# Patient Record
Sex: Female | Born: 1985 | Race: White | Hispanic: No | Marital: Married | Smoking: Former smoker
Health system: Southern US, Community
[De-identification: ages and names within clinical notes are randomized; demographics above are authoritative.]

## PROBLEM LIST (undated history)

## (undated) DIAGNOSIS — H01009 Unspecified blepharitis unspecified eye, unspecified eyelid: Secondary | ICD-10-CM

## (undated) DIAGNOSIS — J45901 Unspecified asthma with (acute) exacerbation: Secondary | ICD-10-CM

## (undated) DIAGNOSIS — H6690 Otitis media, unspecified, unspecified ear: Secondary | ICD-10-CM

## (undated) DIAGNOSIS — J309 Allergic rhinitis, unspecified: Secondary | ICD-10-CM

## (undated) DIAGNOSIS — G43009 Migraine without aura, not intractable, without status migrainosus: Secondary | ICD-10-CM

## (undated) DIAGNOSIS — M5090 Cervical disc disorder, unspecified, unspecified cervical region: Secondary | ICD-10-CM

## (undated) DIAGNOSIS — K219 Gastro-esophageal reflux disease without esophagitis: Secondary | ICD-10-CM

## (undated) DIAGNOSIS — M65849 Other synovitis and tenosynovitis, unspecified hand: Secondary | ICD-10-CM

## (undated) DIAGNOSIS — E785 Hyperlipidemia, unspecified: Secondary | ICD-10-CM

## (undated) DIAGNOSIS — F411 Generalized anxiety disorder: Secondary | ICD-10-CM

## (undated) DIAGNOSIS — M545 Low back pain: Secondary | ICD-10-CM

## (undated) DIAGNOSIS — J45909 Unspecified asthma, uncomplicated: Secondary | ICD-10-CM

## (undated) DIAGNOSIS — L509 Urticaria, unspecified: Secondary | ICD-10-CM

## (undated) DIAGNOSIS — J019 Acute sinusitis, unspecified: Secondary | ICD-10-CM

## (undated) DIAGNOSIS — M6740 Ganglion, unspecified site: Secondary | ICD-10-CM

## (undated) DIAGNOSIS — J069 Acute upper respiratory infection, unspecified: Secondary | ICD-10-CM

## (undated) DIAGNOSIS — M65839 Other synovitis and tenosynovitis, unspecified forearm: Secondary | ICD-10-CM

## (undated) DIAGNOSIS — R51 Headache: Secondary | ICD-10-CM

## (undated) HISTORY — DX: Generalized anxiety disorder: F41.1

## (undated) HISTORY — DX: Allergic rhinitis, unspecified: J30.9

## (undated) HISTORY — DX: Migraine without aura, not intractable, without status migrainosus: G43.009

## (undated) HISTORY — DX: Hyperlipidemia, unspecified: E78.5

## (undated) HISTORY — DX: Morbid (severe) obesity due to excess calories: E66.01

## (undated) HISTORY — DX: Unspecified blepharitis unspecified eye, unspecified eyelid: H01.009

## (undated) HISTORY — DX: Unspecified asthma with (acute) exacerbation: J45.901

## (undated) HISTORY — DX: Headache: R51

## (undated) HISTORY — DX: Gastro-esophageal reflux disease without esophagitis: K21.9

## (undated) HISTORY — DX: Ganglion, unspecified site: M67.40

## (undated) HISTORY — DX: Urticaria, unspecified: L50.9

## (undated) HISTORY — DX: Otitis media, unspecified, unspecified ear: H66.90

## (undated) HISTORY — DX: Cervical disc disorder, unspecified, unspecified cervical region: M50.90

## (undated) HISTORY — DX: Unspecified asthma, uncomplicated: J45.909

## (undated) HISTORY — DX: Other synovitis and tenosynovitis, unspecified hand: M65.849

## (undated) HISTORY — DX: Acute upper respiratory infection, unspecified: J06.9

## (undated) HISTORY — DX: Other synovitis and tenosynovitis, unspecified forearm: M65.839

## (undated) HISTORY — DX: Low back pain: M54.5

## (undated) HISTORY — DX: Acute sinusitis, unspecified: J01.90

---
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
## 2006-04-06 ENCOUNTER — Ambulatory Visit: Payer: Self-pay | Admitting: Internal Medicine

## 2006-09-04 ENCOUNTER — Encounter: Payer: Self-pay | Admitting: Internal Medicine

## 2006-09-04 DIAGNOSIS — F418 Other specified anxiety disorders: Secondary | ICD-10-CM

## 2006-09-04 DIAGNOSIS — M545 Low back pain, unspecified: Secondary | ICD-10-CM

## 2006-09-04 DIAGNOSIS — F411 Generalized anxiety disorder: Secondary | ICD-10-CM

## 2006-09-04 HISTORY — DX: Generalized anxiety disorder: F41.1

## 2006-09-04 HISTORY — DX: Low back pain, unspecified: M54.50

## 2006-09-04 HISTORY — DX: Morbid (severe) obesity due to excess calories: E66.01

## 2006-10-05 ENCOUNTER — Emergency Department: Payer: Self-pay | Admitting: Internal Medicine

## 2006-10-05 ENCOUNTER — Other Ambulatory Visit: Payer: Self-pay

## 2006-11-26 ENCOUNTER — Telehealth (INDEPENDENT_AMBULATORY_CARE_PROVIDER_SITE_OTHER): Payer: Self-pay | Admitting: *Deleted

## 2006-12-24 ENCOUNTER — Ambulatory Visit: Payer: Self-pay | Admitting: Internal Medicine

## 2006-12-25 DIAGNOSIS — J45909 Unspecified asthma, uncomplicated: Secondary | ICD-10-CM

## 2006-12-25 HISTORY — DX: Unspecified asthma, uncomplicated: J45.909

## 2007-06-10 ENCOUNTER — Telehealth (INDEPENDENT_AMBULATORY_CARE_PROVIDER_SITE_OTHER): Payer: Self-pay | Admitting: *Deleted

## 2007-06-14 ENCOUNTER — Ambulatory Visit: Payer: Self-pay | Admitting: Internal Medicine

## 2007-06-14 DIAGNOSIS — E785 Hyperlipidemia, unspecified: Secondary | ICD-10-CM | POA: Insufficient documentation

## 2007-06-14 DIAGNOSIS — G43009 Migraine without aura, not intractable, without status migrainosus: Secondary | ICD-10-CM

## 2007-06-14 HISTORY — DX: Migraine without aura, not intractable, without status migrainosus: G43.009

## 2007-06-14 HISTORY — DX: Hyperlipidemia, unspecified: E78.5

## 2007-08-16 ENCOUNTER — Ambulatory Visit: Payer: Self-pay | Admitting: Internal Medicine

## 2007-08-16 LAB — CONVERTED CEMR LAB
ALT: 19 units/L (ref 0–35)
AST: 19 units/L (ref 0–37)
BUN: 12 mg/dL (ref 6–23)
Basophils Absolute: 0 10*3/uL (ref 0.0–0.1)
Basophils Relative: 0.5 % (ref 0.0–3.0)
CO2: 23 meq/L (ref 19–32)
Chloride: 110 meq/L (ref 96–112)
Creatinine, Ser: 0.7 mg/dL (ref 0.4–1.2)
Eosinophils Relative: 2.6 % (ref 0.0–5.0)
LDL Cholesterol: 84 mg/dL (ref 0–99)
Leukocytes, UA: NEGATIVE
Lymphocytes Relative: 35.9 % (ref 12.0–46.0)
Monocytes Relative: 7.4 % (ref 3.0–12.0)
Neutrophils Relative %: 53.6 % (ref 43.0–77.0)
Nitrite: NEGATIVE
RBC: 4.53 M/uL (ref 3.87–5.11)
Specific Gravity, Urine: 1.015 (ref 1.000–1.03)
TSH: 1.63 microintl units/mL (ref 0.35–5.50)
Total Bilirubin: 1.1 mg/dL (ref 0.3–1.2)
Total CHOL/HDL Ratio: 4.2
Urobilinogen, UA: 0.2 (ref 0.0–1.0)
WBC: 8.5 10*3/uL (ref 4.5–10.5)

## 2007-10-11 ENCOUNTER — Ambulatory Visit: Payer: Self-pay | Admitting: Internal Medicine

## 2007-10-11 DIAGNOSIS — I10 Essential (primary) hypertension: Secondary | ICD-10-CM

## 2007-10-11 DIAGNOSIS — M65849 Other synovitis and tenosynovitis, unspecified hand: Secondary | ICD-10-CM

## 2007-10-11 DIAGNOSIS — M65839 Other synovitis and tenosynovitis, unspecified forearm: Secondary | ICD-10-CM

## 2007-10-11 DIAGNOSIS — M6740 Ganglion, unspecified site: Secondary | ICD-10-CM

## 2007-10-11 DIAGNOSIS — J019 Acute sinusitis, unspecified: Secondary | ICD-10-CM

## 2007-10-11 HISTORY — DX: Acute sinusitis, unspecified: J01.90

## 2007-10-11 HISTORY — DX: Other synovitis and tenosynovitis, unspecified forearm: M65.839

## 2007-10-11 HISTORY — DX: Ganglion, unspecified site: M67.40

## 2007-10-31 ENCOUNTER — Encounter: Payer: Self-pay | Admitting: Internal Medicine

## 2007-12-02 ENCOUNTER — Telehealth (INDEPENDENT_AMBULATORY_CARE_PROVIDER_SITE_OTHER): Payer: Self-pay | Admitting: *Deleted

## 2008-02-17 ENCOUNTER — Telehealth: Payer: Self-pay | Admitting: Internal Medicine

## 2008-02-19 ENCOUNTER — Emergency Department: Payer: Self-pay | Admitting: Internal Medicine

## 2008-03-27 ENCOUNTER — Ambulatory Visit: Payer: Self-pay | Admitting: Internal Medicine

## 2008-03-27 DIAGNOSIS — H6690 Otitis media, unspecified, unspecified ear: Secondary | ICD-10-CM

## 2008-03-27 HISTORY — DX: Otitis media, unspecified, unspecified ear: H66.90

## 2008-03-31 ENCOUNTER — Telehealth (INDEPENDENT_AMBULATORY_CARE_PROVIDER_SITE_OTHER): Payer: Self-pay | Admitting: *Deleted

## 2008-05-21 ENCOUNTER — Ambulatory Visit: Payer: Self-pay | Admitting: Internal Medicine

## 2008-05-21 DIAGNOSIS — H01009 Unspecified blepharitis unspecified eye, unspecified eyelid: Secondary | ICD-10-CM | POA: Insufficient documentation

## 2008-05-21 HISTORY — DX: Unspecified blepharitis unspecified eye, unspecified eyelid: H01.009

## 2008-06-25 ENCOUNTER — Encounter: Admission: RE | Admit: 2008-06-25 | Discharge: 2008-06-25 | Payer: Self-pay | Admitting: Obstetrics & Gynecology

## 2008-10-23 ENCOUNTER — Ambulatory Visit: Payer: Self-pay | Admitting: Internal Medicine

## 2008-10-23 DIAGNOSIS — R51 Headache: Secondary | ICD-10-CM

## 2008-10-23 DIAGNOSIS — R519 Headache, unspecified: Secondary | ICD-10-CM | POA: Insufficient documentation

## 2008-10-23 HISTORY — DX: Headache: R51

## 2008-10-23 LAB — CONVERTED CEMR LAB
Alkaline Phosphatase: 36 units/L — ABNORMAL LOW (ref 39–117)
Basophils Absolute: 0.1 10*3/uL (ref 0.0–0.1)
Bilirubin Urine: NEGATIVE
Bilirubin, Direct: 0.1 mg/dL (ref 0.0–0.3)
CO2: 26 meq/L (ref 19–32)
Calcium: 9.2 mg/dL (ref 8.4–10.5)
Creatinine, Ser: 0.8 mg/dL (ref 0.4–1.2)
Eosinophils Absolute: 0.3 10*3/uL (ref 0.0–0.7)
HDL: 34.5 mg/dL — ABNORMAL LOW (ref >39.00)
Ketones, ur: NEGATIVE mg/dL
Leukocytes, UA: NEGATIVE
Lymphocytes Relative: 27.3 % (ref 12.0–46.0)
MCHC: 35 g/dL (ref 30.0–36.0)
Neutrophils Relative %: 61.1 % (ref 43.0–77.0)
RBC: 4.58 M/uL (ref 3.87–5.11)
RDW: 11.4 % — ABNORMAL LOW (ref 11.5–14.6)
TSH: 1.04 microintl units/mL (ref 0.35–5.50)
Total Bilirubin: 1 mg/dL (ref 0.3–1.2)
Total CHOL/HDL Ratio: 5
Urobilinogen, UA: 0.2 (ref 0.0–1.0)
VLDL: 19.8 mg/dL (ref 0.0–40.0)

## 2009-05-03 ENCOUNTER — Ambulatory Visit: Payer: Self-pay | Admitting: Internal Medicine

## 2009-05-03 DIAGNOSIS — J45901 Unspecified asthma with (acute) exacerbation: Secondary | ICD-10-CM | POA: Insufficient documentation

## 2009-05-03 DIAGNOSIS — L509 Urticaria, unspecified: Secondary | ICD-10-CM

## 2009-05-03 HISTORY — DX: Unspecified asthma with (acute) exacerbation: J45.901

## 2009-05-03 HISTORY — DX: Urticaria, unspecified: L50.9

## 2009-05-10 ENCOUNTER — Telehealth: Payer: Self-pay | Admitting: Internal Medicine

## 2009-10-29 ENCOUNTER — Ambulatory Visit: Payer: Self-pay | Admitting: Internal Medicine

## 2009-10-29 DIAGNOSIS — M5030 Other cervical disc degeneration, unspecified cervical region: Secondary | ICD-10-CM | POA: Insufficient documentation

## 2009-10-29 DIAGNOSIS — J069 Acute upper respiratory infection, unspecified: Secondary | ICD-10-CM

## 2009-10-29 HISTORY — DX: Acute upper respiratory infection, unspecified: J06.9

## 2009-10-30 DIAGNOSIS — J309 Allergic rhinitis, unspecified: Secondary | ICD-10-CM

## 2009-10-30 HISTORY — DX: Allergic rhinitis, unspecified: J30.9

## 2009-11-22 ENCOUNTER — Telehealth: Payer: Self-pay | Admitting: Internal Medicine

## 2010-02-08 NOTE — Progress Notes (Signed)
  Phone Note Refill Request Message from:  Fax from Pharmacy on November 22, 2009 10:48 AM  Refills Requested: Medication #1:  IBUPROFEN 800 MG TABS Take 1 tablet by mouth twice a day   Dosage confirmed as above?Dosage Confirmed   Last Refilled: 10/23/2008   Notes: [facility] Initial call taken by: Robin Ewing CMA (AAMA),  November 22, 2009 10:49 AM    Prescriptions: IBUPROFEN 800 MG TABS (IBUPROFEN) Take 1 tablet by mouth twice a day  #60 x 6   Entered by:   Scharlene Gloss CMA (AAMA)   Authorized by:   Corwin Levins MD   Signed by:   Scharlene Gloss CMA (AAMA) on 11/22/2009   Method used:   Faxed to ...       [facility] [phone]* (retail)       [address] [place], [place]  [address]       Ph: [phone]       Fax: [phone]   RxID:   [id number]

## 2010-02-08 NOTE — Progress Notes (Signed)
Summary: med Refills  Phone Note Refill Request  on May 10, 2009 10:11 AM  Refills Requested: Medication #1:  XANAX 1 MG  TABS 1 three times a day as needed nerves - to fill oct 14   Dosage confirmed as above?Dosage Confirmed   Last Refilled: 10/23/2008   Notes: [facility] fax#[phone]  Medication #2:  VICODIN 5-500 MG  TABS 1 by mouth two times a day as needed pain - to fill oct 15   Dosage confirmed as above?Dosage Confirmed   Last Refilled: 10/23/2008   Notes: [facility] [phone] Initial call taken by: Scharlene Gloss,  May 10, 2009 10:13 AM  Follow-up for Phone Call        both done hardcopy to [facility] side B - dahlia  Follow-up by: Corwin Levins MD,  May 10, 2009 1:17 PM  Additional Follow-up for Phone Call Additional follow up Details #1::        rx faxed to pharmacy Additional Follow-up by: Margaret Pyle, CMA,  May 10, 2009 1:20 PM    New/Updated Medications: XANAX 1 MG  TABS (ALPRAZOLAM) 1 three times a day as needed nerves - to fill May 10, 2009  -   please make return office visit for further refills VICODIN 5-500 MG  TABS (HYDROCODONE-ACETAMINOPHEN) 1 by mouth two times a day as needed pain - to fill May 10, 2009  -  please make return office visit for further refills Prescriptions: VICODIN 5-500 MG  TABS (HYDROCODONE-ACETAMINOPHEN) 1 by mouth two times a day as needed pain - to fill May 10, 2009  -  please make return office visit for further refills  #60 x 5   Entered and Authorized by:   Corwin Levins MD   Signed by:   Corwin Levins MD on 05/10/2009   Method used:   Print then Give to Patient   RxID:   [id number] XANAX 1 MG  TABS (ALPRAZOLAM) 1 three times a day as needed nerves - to fill May 10, 2009  -   please make return office visit for further refills  #90 x 5   Entered and Authorized by:   Corwin Levins MD   Signed by:   Corwin Levins MD on 05/10/2009   Method used:   Print then Give to Patient   RxID:   [id number]

## 2010-02-08 NOTE — Assessment & Plan Note (Signed)
Summary: PLACE ON BACK HURTS/NWS WANTED THIS DATE/NWS   Vital Signs:  Patient profile:   25 year old female Height:      71 inches Weight:      270.50 pounds BMI:     37.86 O2 Sat:      98 % on Room air Temp:     98.4 degrees F oral Pulse rate:   79 / minute BP sitting:   120 / 80  (left arm) Cuff size:   large  Vitals Entered By: Zella Ball Ewing CMA (AAMA) (October 29, 2009 2:21 PM)  O2 Flow:  Room air CC: followup/RE   CC:  followup/RE.  History of Present Illness: here to fu - lost approx 40 lbs  - only taking the BP med every other day as BP seems to be lower and she has some weakness and occasional dizziness;  Pt denies CP, worsening sob, doe, wheezing, orthopnea, pnd, worsening LE edema, palps, dizziness or syncope  Pt denies new neuro symptoms such as headache, facial or extremity weakness      also saw orhto this am - ? cervical disc dz - to start prednisone trral , then MRI if not better in 10 days, still with shooting pain to the left arm at this time but no weakness or numbness  also with URI symptoms for 2  days with mild low grade fever, nasal congestion wtih clearish drainage but no pain or blood. Some mild left ear popping this am , but no hearing loss or vertigo.   Also nasal allegy itch and sneeze without fever or pain for several weeks - out of allergy meds. No headache, but has midl ST yesterday now better.    Denies worsening depressive symtpoms, suicidal ideation or panic.    Preventive Screening-Counseling & Management      Drug Use:  no.    Problems Prior to Update: 1)  Urticaria  (ICD-708.9) 2)  Asthma, With Acute Exacerbation  (ICD-493.92) 3)  Headache  (ICD-784.0) 4)  Preventive Health Care  (ICD-V70.0) 5)  Blepharitis, Left  (ICD-373.00) 6)  Otitis Media, Acute, Left  (ICD-382.9) 7)  Hypertension  (ICD-401.9) 8)  Sinusitis- Acute-nos  (ICD-461.9) 9)  Tenosynovitis, Wrist  (ICD-727.05) 10)  Ganglion Cyst, Wrist, Left  (ICD-727.41) 11)  Common  Migraine  (ICD-346.10) 12)  Preventive Health Care  (ICD-V70.0) 13)  Hyperlipidemia  (ICD-272.4) 14)  Preventive Health Care  (ICD-V70.0) 15)  Family History Diabetes 1st Degree Relative  (ICD-V18.0) 16)  Asthma  (ICD-493.90) 17)  Morbid Obesity  (ICD-278.01) 18)  Low Back Pain  (ICD-724.2) 19)  Depression  (ICD-311) 20)  Anxiety  (ICD-300.00)  Medications Prior to Update: 1)  Xanax 1 Mg  Tabs (Alprazolam) .Marland Kitchen.. 1 Three Times A Day As Needed Nerves - To Fill May 10, 2009  -   Please Make Return Office Visit For Further Refills 2)  Ibuprofen 800 Mg Tabs (Ibuprofen) .... Take 1 Tablet By Mouth Twice A Day 3)  Symbicort 160-4.5 Mcg/act  Aero (Budesonide-Formoterol Fumarate) .... 2 Puffs Two Times A Day 4)  Vicodin 5-500 Mg  Tabs (Hydrocodone-Acetaminophen) .Marland Kitchen.. 1 By Mouth Two Times A Day As Needed Pain - To Fill May 10, 2009  -  Please Make Return Office Visit For Further Refills 5)  Loestrin 24 Fe 1-20 Mg-Mcg Tabs (Norethin Ace-Eth Estrad-Fe) .Marland Kitchen.. 1 By Mouth Daily 6)  Amlodipine Besylate 5 Mg Tabs (Amlodipine Besylate) .Marland Kitchen.. 1 By Mouth Once Daily 7)  Cetirizine Hcl 10 Mg Tabs (Cetirizine  Hcl) .... 1 By Mouth Once Daily As Needed 8)  Epipen 2-Pak 0.3 Mg/0.42ml Devi (Epinephrine) .... Use Asd As Needed 9)  Prednisone 10 Mg Tabs (Prednisone) .... 3po Qd For 3days, Then 2po Qd For 3days, Then 1po Qd For 3days, Then Stop 10)  Tessalon Perles 100 Mg Caps (Benzonatate) .Marland Kitchen.. 1 - 2 By Mouth Three Times A Day As Needed Cough 11)  Singulair 10 Mg Tabs (Montelukast Sodium) .Marland Kitchen.. 1po Once Daily  Current Medications (verified): 1)  Xanax 1 Mg  Tabs (Alprazolam) .Marland Kitchen.. 1 Three Times A Day As Needed Nerves - To Fill Oct 29, 2009 - 2)  Ibuprofen 800 Mg Tabs (Ibuprofen) .... Take 1 Tablet By Mouth Twice A Day 3)  Symbicort 160-4.5 Mcg/act  Aero (Budesonide-Formoterol Fumarate) .... 2 Puffs Two Times A Day 4)  Vicodin 5-500 Mg  Tabs (Hydrocodone-Acetaminophen) .Marland Kitchen.. 1 By Mouth Two Times A Day As Needed Pain - To Fill  Oct 29, 2009 5)  Amlodipine Besylate 2.5 Mg Tabs (Amlodipine Besylate) .Marland Kitchen.. 1po Once Daily 6)  Levocetirizine Dihydrochloride 5 Mg Tabs (Levocetirizine Dihydrochloride) .Marland Kitchen.. 1po Once Daily 7)  Epipen 2-Pak 0.3 Mg/0.61ml Devi (Epinephrine) .... Use Asd As Needed 8)  Bcp Per Gyn  Allergies (verified): No Known Drug Allergies  Past History:  Past Surgical History: Last updated: 12/24/2006 Back surgery - s/p lumbar disc 1/05  Social History: Last updated: 10/29/2009 Alcohol use-yes Current Smoker work - [employer] 12 hr days Drug use-no  Risk Factors: Smoking Status: current (06/14/2007)  Past Medical History: Anxiety Depression Low back pain - chronic Asthma migraine morbid obesity Hyperlipidemia Hypertension pre-cervical cancer Allergic rhinitis  Social History: Alcohol use-yes Current Smoker work - [employer] 12 hr days Drug use-no Drug Use:  no  Review of Systems       all otherwise negative per pt -    Physical Exam  General:  alert and overweight-appearing.   Head:  normocephalic and atraumatic.   Eyes:  vision grossly intact, pupils equal, and pupils round.   Ears:  R ear normal.  , left tm mild erythema, canals ok, sinus nontender Nose:  nasal dischargemucosal pallor and mucosal edema.   Mouth:  pharyngeal erythema and fair dentition.   Neck:  supple and no masses.   Lungs:  normal respiratory effort and normal breath sounds.   Heart:  normal rate and regular rhythm.   Msk:  mild lower cervical tender without cervical paravertebral tedner or swelling Extremities:  no edema, no erythema  Neurologic:  strength normal in all extremities and gait normal.     Impression & Recommendations:  Problem # 1:  HYPERTENSION (ICD-401.9)  Her updated medication list for this problem includes:    Amlodipine Besylate 2.5 Mg Tabs (Amlodipine besylate) .Marland Kitchen... 1po once daily to decrease  the amlodipine due to wt loss;  f/u BP at home adn next visit  BP today:  120/80 Prior BP: 120/80 (05/03/2009)  Labs Reviewed: K+: 3.8 (10/23/2008) Creat: : 0.8 (10/23/2008)   Chol: 177 (10/23/2008)   HDL: 34.50 (10/23/2008)   LDL: 123 (10/23/2008)   TG: 99.0 (10/23/2008)  Problem # 2:  ALLERGIC RHINITIS (ICD-477.9)  Her updated medication list for this problem includes:    Levocetirizine Dihydrochloride 5 Mg Tabs (Levocetirizine dihydrochloride) .Marland Kitchen... 1po once daily treat as above, f/u any worsening signs or symptoms   Problem # 3:  URI (ICD-465.9)  The following medications were removed from the medication list:    Tessalon Perles 100 Mg Caps (Benzonatate) .Marland Kitchen... 1 -  2 by mouth three times a day as needed cough Her updated medication list for this problem includes:    Ibuprofen 800 Mg Tabs (Ibuprofen) .Marland Kitchen... Take 1 tablet by mouth twice a day    Levocetirizine Dihydrochloride 5 Mg Tabs (Levocetirizine dihydrochloride) .Marland Kitchen... 1po once daily treat as above, f/u any worsening signs or symptoms   - c/w viral illness; for mucinex otc as needed as well   Problem # 4:  ANXIETY (ICD-300.00)  Her updated medication list for this problem includes:    Xanax 1 Mg Tabs (Alprazolam) .Marland Kitchen... 1 three times a day as needed nerves - to fill Oct 29, 2009 - stable overall by hx and exam, ok to continue meds/tx as is   Problem # 5:  DISC DISEASE, CERVICAL (ICD-722.4) ok to cont pain meds for now, prednisone to start as per ortho, for MRI soon if not improved  Complete Medication List: 1)  Xanax 1 Mg Tabs (Alprazolam) .Marland Kitchen.. 1 three times a day as needed nerves - to fill Oct 29, 2009 - 2)  Ibuprofen 800 Mg Tabs (Ibuprofen) .... Take 1 tablet by mouth twice a day 3)  Symbicort 160-4.5 Mcg/act Aero (Budesonide-formoterol fumarate) .... 2 puffs two times a day 4)  Vicodin 5-500 Mg Tabs (Hydrocodone-acetaminophen) .Marland Kitchen.. 1 by mouth two times a day as needed pain - to fill Oct 29, 2009 5)  Amlodipine Besylate 2.5 Mg Tabs (Amlodipine besylate) .Marland Kitchen.. 1po once daily 6)  Levocetirizine  Dihydrochloride 5 Mg Tabs (Levocetirizine dihydrochloride) .Marland Kitchen.. 1po once daily 7)  Epipen 2-pak 0.3 Mg/0.86ml Devi (Epinephrine) .... Use asd as needed 8)  Bcp Per Gyn   Other Orders: Admin 1st Vaccine (16109) Flu Vaccine 58yrs + (60454)  Patient Instructions: 1)  decrease the amlodipine to 2.5 mg per day 2)  stop the cetirizine (zyrtec) 3)  start the levocetirizine (generic xzyal) - 1 per day 4)  Continue all previous medications as before this visit , incluiding the nasonex, xanax, vicodin 5)  You can also use Mucinex OTC or it's generic for congestion  6)  You had the flu shot today 7)  Please schedule a follow-up appointment in 6 months with CPX labs Prescriptions: AMLODIPINE BESYLATE 2.5 MG TABS (AMLODIPINE BESYLATE) 1po once daily  #90 x 3   Entered and Authorized by:   Corwin Levins MD   Signed by:   Corwin Levins MD on 10/29/2009   Method used:   Print then Give to Patient   RxID:   [id number] LEVOCETIRIZINE DIHYDROCHLORIDE 5 MG TABS (LEVOCETIRIZINE DIHYDROCHLORIDE) 1po once daily  #30 x 11   Entered and Authorized by:   Corwin Levins MD   Signed by:   Corwin Levins MD on 10/29/2009   Method used:   Print then Give to Patient   RxID:   [id number] VICODIN 5-500 MG  TABS (HYDROCODONE-ACETAMINOPHEN) 1 by mouth two times a day as needed pain - to fill Oct 29, 2009  #60 x 5   Entered and Authorized by:   Corwin Levins MD   Signed by:   Corwin Levins MD on 10/29/2009   Method used:   Print then Give to Patient   RxID:   [id number] XANAX 1 MG  TABS (ALPRAZOLAM) 1 three times a day as needed nerves - to fill Oct 29, 2009 -  #90 x 5   Entered and Authorized by:   Corwin Levins MD   Signed by:   Corwin Levins MD on  10/29/2009   Method used:   Print then Give to Patient   RxID:   [id number]    Orders Added: 1)  Admin 1st Vaccine [90471] 2)  Flu Vaccine 15yrs + [14782] 3)  Est. Patient Level IV [95621]   Flu Vaccine Consent Questions     Do you have a history  of severe allergic reactions to this vaccine? no    Any prior history of allergic reactions to egg and/or gelatin? no    Do you have a sensitivity to the preservative Thimersol? no    Do you have a past history of Guillan-Barre Syndrome? no    Do you currently have an acute febrile illness? no    Have you ever had a severe reaction to latex? no    Vaccine information given and explained to patient? yes    Are you currently pregnant? no    Lot Number:[id number]   Exp Date:07/09/2010   Site Given  Left Deltoid [id number]

## 2010-02-08 NOTE — Assessment & Plan Note (Signed)
Summary: FU---STC   Vital Signs:  Patient profile:   25 year old female Height:      71 inches Weight:      302.75 pounds BMI:     42.38 O2 Sat:      97 % on Room air Temp:     98 degrees F oral Pulse rate:   111 / minute BP sitting:   120 / 80  (left arm) Cuff size:   large  Vitals Entered ByMarland Kitchen Zella Ball Ewing (May 03, 2009 1:35 PM)  O2 Flow:  Room air CC: followup, refills/RE   CC:  followup and refills/RE.  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 then 3 days ago with onset left lower lip swelling  - some soreness today after it gets better, but normally no pain , recurring randomly over the past 3 days - yest at easter dinner noted by mother;  no tongue swelting, throat swlling, sob.  Has had some bronchiis and ST with fever and wheezing and cough adn congestion  - seen at urgent care 9 days ago - tx with breathing neb, cough med, and levaquin. Cont's the symbicort.   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 Zyrted seemed to help the most.    Problems Prior to Update: 1)  Urticaria  (ICD-708.9) 2)  Asthma, With Acute Exacerbation  (ICD-493.92) 3)  Bronchitis-acute  (ICD-466.0) 4)  Headache  (ICD-784.0) 5)  Preventive Health Care  (ICD-V70.0) 6)  Blepharitis, Left  (ICD-373.00) 7)  Otitis Media, Acute, Left  (ICD-382.9) 8)  Hypertension  (ICD-401.9) 9)  Sinusitis- Acute-nos  (ICD-461.9) 10)  Tenosynovitis, Wrist  (ICD-727.05) 11)  Ganglion Cyst, Wrist, Left  (ICD-727.41) 12)  Common Migraine  (ICD-346.10) 13)  Preventive Health Care  (ICD-V70.0) 14)  Hyperlipidemia  (ICD-272.4) 15)  Preventive Health Care  (ICD-V70.0) 16)  Family History Diabetes 1st Degree Relative  (ICD-V18.0) 17)  Asthma  (ICD-493.90) 18)  Morbid Obesity   (ICD-278.01) 19)  Low Back Pain  (ICD-724.2) 20)  Depression  (ICD-311) 21)  Anxiety  (ICD-300.00)  Medications Prior to Update: 1)  Xanax 1 Mg  Tabs (Alprazolam) .Marland Kitchen.. 1 Three Times A Day As Needed Nerves - To Fill Oct 22, 2008 2)  Ibuprofen 800 Mg Tabs (Ibuprofen) .... Take 1 Tablet By Mouth Twice A Day 3)  Symbicort 160-4.5 Mcg/act  Aero (Budesonide-Formoterol Fumarate) .... 2 Puffs Two Times A Day 4)  Vicodin 5-500 Mg  Tabs (Hydrocodone-Acetaminophen) .Marland Kitchen.. 1 By Mouth Two Times A Day As Needed Pain - To Fill Oct 23, 2008 5)  Loestrin 24 Fe 1-20 Mg-Mcg Tabs (Norethin Ace-Eth Estrad-Fe) .Marland Kitchen.. 1 By Mouth Daily 6)  Amlodipine Besylate 5 Mg Tabs (Amlodipine Besylate) .Marland Kitchen.. 1 By Mouth Once Daily 7)  Cetirizine Hcl 10 Mg Tabs (Cetirizine Hcl) .Marland Kitchen.. 1 By Mouth Once Daily As Needed 8)  Epipen 2-Pak 0.3 Mg/0.39ml Devi (Epinephrine) .... Use Asd As Needed 9)  Azithromycin 250 Mg Tabs (Azithromycin) .... 2po Qd For 1 Day, Then 1po Qd For 4days, Then Stop  Current Medications (verified): 1)  Xanax 1 Mg  Tabs (Alprazolam) .Marland Kitchen.. 1 Three Times A Day As Needed Nerves - To Fill Oct 22, 2008 2)  Ibuprofen 800 Mg  Tabs (Ibuprofen) .... Take 1 Tablet By Mouth Twice A Day 3)  Symbicort 160-4.5 Mcg/act  Aero (Budesonide-Formoterol Fumarate) .... 2 Puffs Two Times A Day 4)  Vicodin 5-500 Mg  Tabs (Hydrocodone-Acetaminophen) .Marland Kitchen.. 1 By Mouth Two Times A Day As Needed Pain - To Fill Oct 23, 2008 5)  Loestrin 24 Fe 1-20 Mg-Mcg Tabs (Norethin Ace-Eth Estrad-Fe) .Marland Kitchen.. 1 By Mouth Daily 6)  Amlodipine Besylate 5 Mg Tabs (Amlodipine Besylate) .Marland Kitchen.. 1 By Mouth Once Daily 7)  Cetirizine Hcl 10 Mg Tabs (Cetirizine Hcl) .Marland Kitchen.. 1 By Mouth Once Daily As Needed 8)  Epipen 2-Pak 0.3 Mg/0.5ml Devi (Epinephrine) .... Use Asd As Needed 9)  Prednisone 10 Mg Tabs (Prednisone) .... 3po Qd For 3days, Then 2po Qd For 3days, Then 1po Qd For 3days, Then Stop 10)  Tessalon Perles 100 Mg Caps (Benzonatate) .Marland Kitchen.. 1 - 2 By Mouth Three Times A Day As  Needed Cough 11)  Singulair 10 Mg Tabs (Montelukast Sodium) .Marland Kitchen.. 1po Once Daily  Allergies (verified): No Known Drug Allergies  Past History:  Past Medical History: Last updated: 10/23/2008 Anxiety Depression Low back pain - chronic Asthma migraine morbid obesity Hyperlipidemia Hypertension pre-cervical cancer  Past Surgical History: Last updated: 12/24/2006 Back surgery - s/p lumbar disc 1/05  Social History: Last updated: 10/23/2008 Alcohol use-yes Current Smoker work - [employer] 12 hr days  Risk Factors: Smoking Status: current (06/14/2007)  Review of Systems       all otherwise negative per pt -    Physical Exam  General:  alert and overweight-appearing.  alert.   Head:  normocephalic and atraumatic.  normocephalic and atraumatic.   Eyes:  vision grossly intact, pupils equal, and pupils round.  vision grossly intact, pupils equal, and pupils round.   Ears:  bilat tm's midl red, sinus nontender Nose:  nasal dischargemucosal pallor and mucosal edema.  nasal dischargemucosal pallor and mucosal edema.   Mouth:  pharyngeal erythema and fair dentition.  pharyngeal erythema and fair dentition.   Neck:  supple and no masses.  supple and no masses.   Lungs:  normal respiratory effort and normal breath sounds.  normal respiratory effort and normal breath sounds.   Heart:  normal rate and regular rhythm.  normal rate and regular rhythm.   Extremities:  no edema, no erythema  Skin:  color normal.  , has mild urticaria to trunk and extrem's,  also mild nontender left lower lip swelingcolor normal.   Psych:  slightly anxious.  slightly anxious.     Impression & Recommendations:  Problem # 1:  BRONCHITIS-ACUTE (ICD-466.0)  The following medications were removed from the medication list:    Azithromycin 250 Mg Tabs (Azithromycin) .Marland Kitchen... 2po qd for 1 day, then 1po qd for 4days, then stop Her updated medication list for this problem includes:    Symbicort 160-4.5 Mcg/act  Aero (Budesonide-formoterol fumarate) .Marland Kitchen... 2 puffs two times a day    Tessalon Perles 100 Mg Caps (Benzonatate) .Marland Kitchen... 1 - 2 by mouth three times a day as needed cough    Singulair 10 Mg Tabs (Montelukast sodium) .Marland Kitchen... 1po once daily improving, no need further antibx needed, for cough med as needed   Orders: Depo- Medrol 40mg  (J1030) Depo- Medrol 80mg  (J1040) Admin of Therapeutic Inj  intramuscular or subcutaneous (16109)  Problem # 2:  ASTHMA, WITH ACUTE EXACERBATION (ICD-493.92)  Her updated medication list for this problem includes:    Symbicort 160-4.5 Mcg/act Aero (Budesonide-formoterol fumarate) .Marland Kitchen... 2 puffs two times a day  Prednisone 10 Mg Tabs (Prednisone) .Marland Kitchen... 3po qd for 3days, then 2po qd for 3days, then 1po qd for 3days, then stop    Singulair 10 Mg Tabs (Montelukast sodium) .Marland Kitchen... 1po once daily mild at best , for depo shot today, prednison burst and taper off, add singulair for baseline uncontrolled as well   Problem # 3:  URTICARIA (ICD-708.9) mild, with mild left lower lip angioedema - treat as above, f/u any worsening signs or symptoms , cont zyrtec as needed , refilled the epipen, consider f/u wtih allergy - declines for now;  very unlikely any relation to the levaquin given the hx  Problem # 4:  HYPERTENSION (ICD-401.9)  Her updated medication list for this problem includes:    Amlodipine Besylate 5 Mg Tabs (Amlodipine besylate) .Marland Kitchen... 1 by mouth once daily  BP today: 120/80 Prior BP: 138/76 (10/23/2008)  Labs Reviewed: K+: 3.8 (10/23/2008) Creat: : 0.8 (10/23/2008)   Chol: 177 (10/23/2008)   HDL: 34.50 (10/23/2008)   LDL: 123 (10/23/2008)   TG: 99.0 (10/23/2008) stable overall by hx and exam, ok to continue meds/tx as is   Complete Medication List: 1)  Xanax 1 Mg Tabs (Alprazolam) .Marland Kitchen.. 1 three times a day as needed nerves - to fill Oct 22, 2008 2)  Ibuprofen 800 Mg Tabs (Ibuprofen) .... Take 1 tablet by mouth twice a day 3)  Symbicort 160-4.5 Mcg/act Aero  (Budesonide-formoterol fumarate) .... 2 puffs two times a day 4)  Vicodin 5-500 Mg Tabs (Hydrocodone-acetaminophen) .Marland Kitchen.. 1 by mouth two times a day as needed pain - to fill Oct 23, 2008 5)  Loestrin 24 Fe 1-20 Mg-mcg Tabs (Norethin ace-eth estrad-fe) .Marland Kitchen.. 1 by mouth daily 6)  Amlodipine Besylate 5 Mg Tabs (Amlodipine besylate) .Marland Kitchen.. 1 by mouth once daily 7)  Cetirizine Hcl 10 Mg Tabs (Cetirizine hcl) .Marland Kitchen.. 1 by mouth once daily as needed 8)  Epipen 2-pak 0.3 Mg/0.47ml Devi (Epinephrine) .... Use asd as needed 9)  Prednisone 10 Mg Tabs (Prednisone) .... 3po qd for 3days, then 2po qd for 3days, then 1po qd for 3days, then stop 10)  Tessalon Perles 100 Mg Caps (Benzonatate) .Marland Kitchen.. 1 - 2 by mouth three times a day as needed cough 11)  Singulair 10 Mg Tabs (Montelukast sodium) .Marland Kitchen.. 1po once daily  Patient Instructions: 1)  you had the steroid shot today 2)  Please take all new medications as prescribed  - the prednisone, singulair, pill for coughing 3)  Continue all previous medications as before this visit , including the epipen as needed  4)  Please schedule a follow-up appointment in 6 months with CPX labs Prescriptions: SINGULAIR 10 MG TABS (MONTELUKAST SODIUM) 1po once daily  #90 x 3   Entered and Authorized by:   Corwin Levins MD   Signed by:   Corwin Levins MD on 05/03/2009   Method used:   Print then Give to Patient   RxID:   [id number] TESSALON PERLES 100 MG CAPS (BENZONATATE) 1 - 2 by mouth three times a day as needed cough  #50 x 1   Entered and Authorized by:   Corwin Levins MD   Signed by:   Corwin Levins MD on 05/03/2009   Method used:   Print then Give to Patient   RxID:   [id number] PREDNISONE 10 MG TABS (PREDNISONE) 3po qd for 3days, then 2po qd for 3days, then 1po qd for 3days, then stop  #18 x 0   Entered and Authorized by:  Corwin Levins MD   Signed by:   Corwin Levins MD on 05/03/2009   Method used:   Print then Give to Patient   RxID:   [id number] AMLODIPINE  BESYLATE 5 MG TABS (AMLODIPINE BESYLATE) 1 by mouth once daily  #30 x 11   Entered and Authorized by:   Corwin Levins MD   Signed by:   Corwin Levins MD on 05/03/2009   Method used:   Print then Give to Patient   RxID:   [id number] SYMBICORT 160-4.5 MCG/ACT  AERO (BUDESONIDE-FORMOTEROL FUMARATE) 2 puffs two times a day  #1 x 11   Entered and Authorized by:   Corwin Levins MD   Signed by:   Corwin Levins MD on 05/03/2009   Method used:   Print then Give to Patient   RxID:   [id number] CETIRIZINE HCL 10 MG TABS (CETIRIZINE HCL) 1 by mouth once daily as needed  #90 x 3   Entered and Authorized by:   Corwin Levins MD   Signed by:   Corwin Levins MD on 05/03/2009   Method used:   Print then Give to Patient   RxID:   [id number] EPIPEN 2-PAK 0.3 MG/0.3ML DEVI (EPINEPHRINE) use asd as needed  #1 x 1   Entered and Authorized by:   Corwin Levins MD   Signed by:   Corwin Levins MD on 05/03/2009   Method used:   Print then Give to Patient   RxID:   [id number]    Medication Administration  Injection # 1:    Medication: Depo- Medrol 40mg     Diagnosis: BRONCHITIS-ACUTE (ICD-466.0)    Route: IM    Site: LUOQ gluteus    Exp Date: 11/2011    Lot #: [id number]    Mfr: Pharm[employer]    Given by: Zella Ball Ewing (May 03, 2009 2:25 PM)  Injection # 2:    Medication: Depo- Medrol 80mg     Diagnosis: BRONCHITIS-ACUTE (ICD-466.0)    Route: IM    Site: LUOQ gluteus    Exp Date: 11/2011    Lot #: [id number]    Mfr: [employer]    Given by: Zella Ball Ewing (May 03, 2009 2:25 PM)  Orders Added: 1)  Est. Patient Level IV [10932] 2)  Depo- Medrol 40mg  [J1030] 3)  Depo- Medrol 80mg  [J1040] 4)  Admin of Therapeutic Inj  intramuscular or subcutaneous [35573]

## 2010-04-13 ENCOUNTER — Ambulatory Visit (INDEPENDENT_AMBULATORY_CARE_PROVIDER_SITE_OTHER): Payer: PRIVATE HEALTH INSURANCE | Admitting: Internal Medicine

## 2010-04-13 ENCOUNTER — Encounter: Payer: Self-pay | Admitting: Internal Medicine

## 2010-04-13 VITALS — BP 100/62 | HR 89 | Temp 98.6°F | Ht 71.0 in | Wt 253.0 lb

## 2010-04-13 DIAGNOSIS — M545 Low back pain, unspecified: Secondary | ICD-10-CM

## 2010-04-13 DIAGNOSIS — F411 Generalized anxiety disorder: Secondary | ICD-10-CM

## 2010-04-13 DIAGNOSIS — I10 Essential (primary) hypertension: Secondary | ICD-10-CM

## 2010-04-13 DIAGNOSIS — J309 Allergic rhinitis, unspecified: Secondary | ICD-10-CM

## 2010-04-13 DIAGNOSIS — Z0000 Encounter for general adult medical examination without abnormal findings: Secondary | ICD-10-CM

## 2010-04-13 DIAGNOSIS — J45909 Unspecified asthma, uncomplicated: Secondary | ICD-10-CM

## 2010-04-13 MED ORDER — ALPRAZOLAM 1 MG PO TABS: 1.0000 mg | ORAL_TABLET | Freq: Three times a day (TID) | ORAL | Status: DC | PRN

## 2010-04-13 MED ORDER — HYDROCODONE-ACETAMINOPHEN 5-500 MG PO TABS: 1.0000 | ORAL_TABLET | Freq: Three times a day (TID) | ORAL | Status: DC | PRN

## 2010-04-13 MED ORDER — LEVOCETIRIZINE DIHYDROCHLORIDE 5 MG PO TABS: 5.0000 mg | ORAL_TABLET | Freq: Every day | ORAL | Status: DC

## 2010-04-13 NOTE — Patient Instructions (Signed)
Stop the amlodipine as you have Continue all other medications as before Please return in 6 mo with Lab testing done 3-5 days before

## 2010-04-14 ENCOUNTER — Encounter: Payer: Self-pay | Admitting: Internal Medicine

## 2010-04-14 NOTE — Assessment & Plan Note (Signed)
stable overall by hx and exam, most recent lab reviewed with pt, and pt to continue medical treatment as before 

## 2010-04-14 NOTE — Assessment & Plan Note (Addendum)
Overcontrolled on med , now ok on no med at this time after recent wt loss  Lab Results  Component Value Date   WBC 10.2 10/23/2008   HGB 15.0 10/23/2008   HGB NEGATIVE 10/23/2008   HCT 42.9 10/23/2008   PLT 184.0 10/23/2008   CHOL 177 10/23/2008   TRIG 99.0 10/23/2008   HDL 34.50* 10/23/2008   ALT 19 10/23/2008   AST 17 10/23/2008   NA 138 10/23/2008   K 3.8 10/23/2008   CL 105 10/23/2008   CREATININE 0.8 10/23/2008   BUN 13 10/23/2008   CO2 26 10/23/2008   TSH 1.04 10/23/2008

## 2010-04-14 NOTE — Progress Notes (Signed)
Subjective:    Patient ID: Martha Thompson, female    DOB: Jul 22, 1985, 25 y.o.   MRN: [id number]  HPI  Here to f/u; overall doing ok,  Pt denies chest pain, increased sob or doe, wheezing, orthopnea, PND, increased LE swelling, palpitations, dizziness or syncope. Pt denies new neurological symptoms such as new headache, or facial or extremity weakness or numbness  Pt denies polydipsia, polyuria  Pt states overall good compliance with meds, trying to follow lower cholesterol, diabetic diet, wt overall stable but little exercise however. Pt continues to have recurring LBP without change in severity, bowel or bladder change, fever, wt loss,  worsening LE pain/numbness/weakness, gait change or falls. Nasal allergy symptoms stable as well, but needs med refills for better control  - out of xyzal for several wks.  Has lost overall quite a bit of wt from peak 217 to current 253,  Had to stop the amlodipine a few months ago due to dizziness.  Denies worsening depressive symptoms, suicidal ideation, or panic, though has ongoing anxiety, not increased recently.   Past Medical History  Diagnosis Date  . HYPERLIPIDEMIA 06/14/2007  . Morbid obesity 09/04/2006  . ANXIETY 09/04/2006  . DEPRESSION 09/04/2006  . COMMON MIGRAINE 06/14/2007  . BLEPHARITIS, LEFT 05/21/2008  . OTITIS MEDIA, ACUTE, LEFT 03/27/2008  . HYPERTENSION 10/11/2007  . SINUSITIS- ACUTE-NOS 10/11/2007  . URI 10/29/2009  . ALLERGIC RHINITIS 10/30/2009  . ASTHMA 12/25/2006  . ASTHMA, WITH ACUTE EXACERBATION 05/03/2009  . URTICARIA 05/03/2009  . DISC DISEASE, CERVICAL 10/29/2009  . LOW BACK PAIN 09/04/2006  . TENOSYNOVITIS, WRIST 10/11/2007  . GANGLION CYST, WRIST, LEFT 10/11/2007  . Headache 10/23/2008   Past Surgical History  Procedure Date  . Back surgury 01/2003    s/p lumbar disc    reports that she has been smoking.  She does not have any smokeless tobacco history on file. She reports that she drinks alcohol. She reports that she does not use  illicit drugs. family history includes Anxiety disorder in her father and Diabetes in her father. No Known Allergies Current Outpatient Prescriptions on File Prior to Visit  Medication Sig Dispense Refill  . budesonide-formoterol (SYMBICORT) 160-4.5 MCG/ACT inhaler Inhale 2 puffs into the lungs 2 (two) times daily.        Marland Kitchen EPINEPHrine (EPIPEN 2-PAK) 0.3 mg/0.3 mL DEVI Inject 0.3 mg into the muscle. Use as directed       . ibuprofen (ADVIL,MOTRIN) 800 MG tablet Take 800 mg by mouth 2 (two) times daily at 10 AM and 5 PM.         Review of Systems Review of Systems  Constitutional: Negative for diaphoresis and unexpected weight change.  HENT: Negative for drooling and tinnitus.   Eyes: Negative for photophobia and visual disturbance.  Respiratory: Negative for choking and stridor.   Gastrointestinal: Negative for vomiting and blood in stool.  Genitourinary: Negative for hematuria and decreased urine volume.  Musculoskeletal: Negative for gait problem.  Skin: Negative for color change and wound.  Neurological: Negative for tremors and numbness.  Psychiatric/Behavioral: Negative for decreased concentration. The patient is not hyperactive.       Objective:   Physical ExamBP 100/62  Pulse 89  Temp(Src) 98.6 F (37 C) (Oral)  Ht 5\' 11"  (1.803 m)  Wt 253 lb (114.76 kg)  BMI 35.29 kg/m2  SpO2 97%  LMP 03/10/2010 Physical Exam  VS noted Constitutional: Pt appears well-developed and well-nourished.  HENT: Head: Normocephalic.  Right Ear: External ear normal.  Left Ear: External ear normal.  Nasal passages congested, clearish drainage. Eyes: Conjunctivae and EOM are normal. Pupils are equal, round, and reactive to light.  Neck: Normal range of motion. Neck supple.  Cardiovascular: Normal rate and regular rhythm.   Pulmonary/Chest: Effort normal and breath sounds normal.  Abd:  Soft, NT, non-distended, + BS Neurological: Pt is alert. No cranial nerve deficit. motor intact throughout,  spine nontender Skin: Skin is warm. No erythema.  Psychiatric: Pt behavior is normal. Thought content normal.  1+ nervous today         Assessment & Plan:

## 2010-05-27 NOTE — Op Note (Signed)
NAME:  Martha Thompson, Martha Thompson                       ACCOUNT NO.:  [id number]   MEDICAL RECORD NO.:  [id number]                   PATIENT TYPE:  AMB   LOCATION:  DSC                                  FACILITY:  [facility]   PHYSICIAN:  Alfredia Ferguson, M.D.               DATE OF BIRTH:  March 09, 1985   DATE OF PROCEDURE:  08/06/2003  DATE OF DISCHARGE:                                 OPERATIVE REPORT   PREOPERATIVE DIAGNOSIS:  Biopsy proven dysplastic nevus, left deltoid area,  1 cm biopsy site.   POSTOPERATIVE DIAGNOSIS:  Biopsy proven dysplastic nevus, left deltoid area,  1 cm biopsy site.   OPERATION PERFORMED:  Excision of left deltoid biopsy site with  approximately 2 mm margins.   SURGEON:  Alfredia Ferguson, M.D.   ANESTHESIA:  2% Xylocaine, 1:100,000 epinephrine.   INDICATIONS FOR PROCEDURE:  The patient is a 25 year old woman with a biopsy  proven dysplastic over her left upper deltoid area.  The patient had the  area removed but had positive margins.  She returns for further excision to  clear to margins.  The patient understands the risks of unsightly scarring.  She understands the risks of again having positive margins requiring further  removal.  In spite of that she wishes to proceed.   DESCRIPTION OF PROCEDURE:  Skin markers were placed around this biopsy site  with approximately 2 to 3 mm margins.  Local anesthesia using 2% Xylocaine  1:100,000 epinephrine was infiltrated.  The area was prepped with Betadine  and draped with sterile drapes.  Elliptical excision of the lesion was  carried out down to the level of the subcutaneous tissue.  Specimen was  passed off the field for pathology.  Hemostasis was accomplished using  pressure.  The wound edges were undermined for a distance of 7 mm in all  directions.  The dermis was closed with multiple interrupted 4-0 Monocryl  suture.  The skin edges were united with a running 4-0 Monocryl  subcuticular.  The patient tolerated the  procedure well.  The area was  cleansed, dried and a light dressing was applied.  The patient was  discharged to home in satisfactory condition.                                               Alfredia Ferguson, M.D.    WBB/MEDQ  D:  08/06/2003  T:  08/06/2003  Job:  [id number]   cc:   Dollene Cleveland, M.D.  [address] [place]  [place] [address]  Fax: [phone]

## 2010-05-27 NOTE — Assessment & Plan Note (Signed)
[facility]                                   ON-CALL NOTE   Martha Thompson, Martha Thompson                      MRN:          [id number]  DATE:09/13/2005                            DOB:          04/10/1985    Phone number [phone].   The patient has been on Xanax for about 2 years.  Most recently on 1 mg  b.i.d. for quite a while and called for a refill as she was running out and  was told that she needed an office visit which was scheduled for 5 days from  now and the nurse Britta Mccreedy told her that she could call in some medicine.  However, called her back because she missed her appointment in March that  they could not refill it at all. The patient is somewhat distressed as she  has been on this a while and is fearful would be losing her job if she had a  panic attack and wonders what she can do before Monday. Had discussed with  her what we can do.  We do not usually call in medications.  However, it is  unclear whether this message got to the physician about a plan and will RX 1  mg of Xanax #4 no refills that she can take 1 b.i.d. and have her call back  tomorrow and have them discuss this with the physician about plans or can it  be refilled up until she comes in at 8:15 on Monday morning.                                   Neta Mends. Fabian Sharp, MD   WKP/MedQ  DD:  09/13/2005  DT:  09/14/2005  Job #:  [id number]   cc:   Corwin Levins, MD

## 2010-05-27 NOTE — Op Note (Signed)
NAME:  Martha Thompson, Martha Thompson                       ACCOUNT NO.:  [id number]   MEDICAL RECORD NO.:  [id number]                   PATIENT TYPE:  OBV   LOCATION:  0478                                 FACILITY:  [facility]   PHYSICIAN:  Georges Lynch. Darrelyn Hillock, M.D.             DATE OF BIRTH:  07/16/1985   DATE OF PROCEDURE:  07/03/2003  DATE OF DISCHARGE:                                 OPERATIVE REPORT   SURGEON:  Georges Lynch. Darrelyn Hillock, M.D.   ASSISTANT:  Jene Every, M.D.   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  She had no severe sciatica, no pain distal to  the buttocks.   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  She had no severe sciatica, no pain distal to  the buttocks.   OPERATION/PROCEDURE:  Hemilaminectomy and microdiskectomy at L4-L5 on the  left with a foraminotomy as well at the L5 root.   DESCRIPTION OF PROCEDURE:  Prior to surgery she had 1 g of IV Ancef.  A  routine orthopedic prep and drape on the back were carried out.  Two needles  were placed in the back for localization purposes and an x-ray was taken.  At this time decision was made over the L4-5 interspace.  Bleeders were  identified and cauterized and possible strips from the lamina of the spinous  process in the usual fashion.  Another x-ray was taken and this showed we  were at the L4-5 interspace.  I then inserted the [facility] retractors.  I  carried out a hemilaminectomy in the usual fashion at L4-5 and then did a  foraminotomy at the L5 root.  We identified the L5 root and dura, gently  retracted out, cauterized the lateral recess with the bipolar and then went  down and identified the disk.  Cruciate incision was made in the disk space.  At this time I then did a microdiskectomy.  I was able to take the Epstein  curets and go all the way across the midline very easily.  There was no  reason  to do a hemilaminectomy on the opposite side from the central disk  because we were able to easily reach it from the left and most of her  symptoms were in the left buttocks.  We were able to nicely decompress the  nerve root __________ subligamentous with a the nerve hook as well as the  Epstein curet and __________ out the disk.  We made multiple passes into the  disk space.  The dura was freed. Nerve roots were freed.  We thoroughly  irrigated out the area again and was able to easily pass the hockey stick  down the foramina.  There were no other fragments noted.  We then loosely  applied some thrombin-soaked Gelfoam, closed the wound in layers in the  usual  fashion.  I left a deep proximal portion of the wound open partially  for drainage purposes.  The remaining part of the wound was closed in layers  in the usual fashion.  Skin was closed with metal staples and a sterile  Neosporin dressing was applied.  The patient had 1 g of IV Ancef  preoperatively.  The patient left the operating room in satisfactory  condition.                                               Ronald A. Darrelyn Hillock, M.D.    RAG/MEDQ  D:  07/03/2003  T:  07/03/2003  Job:  [id number]

## 2010-10-13 ENCOUNTER — Ambulatory Visit: Payer: PRIVATE HEALTH INSURANCE | Admitting: Internal Medicine

## 2010-10-14 ENCOUNTER — Ambulatory Visit (INDEPENDENT_AMBULATORY_CARE_PROVIDER_SITE_OTHER): Payer: PRIVATE HEALTH INSURANCE | Admitting: Internal Medicine

## 2010-10-14 ENCOUNTER — Encounter: Payer: Self-pay | Admitting: Internal Medicine

## 2010-10-14 VITALS — BP 102/70 | HR 119 | Temp 99.0°F | Ht 71.0 in | Wt 230.2 lb

## 2010-10-14 DIAGNOSIS — R062 Wheezing: Secondary | ICD-10-CM | POA: Insufficient documentation

## 2010-10-14 DIAGNOSIS — J209 Acute bronchitis, unspecified: Secondary | ICD-10-CM | POA: Insufficient documentation

## 2010-10-14 DIAGNOSIS — F411 Generalized anxiety disorder: Secondary | ICD-10-CM

## 2010-10-14 DIAGNOSIS — F419 Anxiety disorder, unspecified: Secondary | ICD-10-CM | POA: Insufficient documentation

## 2010-10-14 DIAGNOSIS — M545 Low back pain: Secondary | ICD-10-CM

## 2010-10-14 DIAGNOSIS — N840 Polyp of corpus uteri: Secondary | ICD-10-CM | POA: Insufficient documentation

## 2010-10-14 DIAGNOSIS — G8929 Other chronic pain: Secondary | ICD-10-CM

## 2010-10-14 MED ORDER — IBUPROFEN 800 MG PO TABS: 800.0000 mg | ORAL_TABLET | Freq: Two times a day (BID) | ORAL | Status: DC

## 2010-10-14 MED ORDER — LEVOFLOXACIN 500 MG PO TABS: 500.0000 mg | ORAL_TABLET | Freq: Every day | ORAL | Status: AC

## 2010-10-14 MED ORDER — BUDESONIDE-FORMOTEROL FUMARATE 160-4.5 MCG/ACT IN AERO: 2.0000 | INHALATION_SPRAY | Freq: Two times a day (BID) | RESPIRATORY_TRACT | Status: DC

## 2010-10-14 MED ORDER — METHYLPREDNISOLONE ACETATE 80 MG/ML IJ SUSP
120.0000 mg | Freq: Once | INTRAMUSCULAR | Status: AC
  Administered 2010-10-14: 120 mg via INTRAMUSCULAR

## 2010-10-14 MED ORDER — BENZONATATE 100 MG PO CAPS: 100.0000 mg | ORAL_CAPSULE | Freq: Two times a day (BID) | ORAL | Status: AC | PRN

## 2010-10-14 MED ORDER — HYDROCODONE-ACETAMINOPHEN 5-500 MG PO TABS: 1.0000 | ORAL_TABLET | Freq: Three times a day (TID) | ORAL | Status: DC | PRN

## 2010-10-14 MED ORDER — NORGESTIMATE-ETH ESTRADIOL 0.25-35 MG-MCG PO TABS: 1.0000 | ORAL_TABLET | Freq: Every day | ORAL | Status: DC

## 2010-10-14 MED ORDER — ALPRAZOLAM 1 MG PO TABS: 1.0000 mg | ORAL_TABLET | Freq: Three times a day (TID) | ORAL | Status: DC | PRN

## 2010-10-14 MED ORDER — BENZONATATE 100 MG PO CAPS: 100.0000 mg | ORAL_CAPSULE | Freq: Two times a day (BID) | ORAL | Status: DC | PRN

## 2010-10-14 MED ORDER — LEVOCETIRIZINE DIHYDROCHLORIDE 5 MG PO TABS: 5.0000 mg | ORAL_TABLET | Freq: Every day | ORAL | Status: DC

## 2010-10-14 NOTE — Patient Instructions (Signed)
You had the steroid shot today Take all new medications as prescribed Continue all other medications as before Please return in 6 mo with Lab testing done 3-5 days before Good luck with your surgury in November

## 2010-10-14 NOTE — Assessment & Plan Note (Signed)
Mild, for depomedrol IM, and predpack asd,  to f/u any worsening symptoms or concerns  

## 2010-10-14 NOTE — Progress Notes (Signed)
  Subjective:    Patient ID: Martha Thompson, female    DOB: [birth year], 25 y.o.   MRN: [id number]  HPI  Here with acute onset mild to mod 2-3 days ST, HA, general weakness and malaise, with prod cough greenish sputum, but Pt denies chest pain, increased sob or doe, wheezing, orthopnea, PND, increased LE swelling, palpitations, dizziness or syncopek, except for mild onset wheezing this am.  Denies worsening depressive symptoms, suicidal ideation, or panic, though has ongoing anxiety, not increased recently, controlled with current meds.  Pt continues to have recurring LBP without change in severity, bowel or bladder change, fever, wt loss,  worsening LE pain/numbness/weakness, gait change or falls, need med refills.   Also with recent uterine polyp and menorrhagia; for surgury soon to remove in November Past Medical History  Diagnosis Date  . HYPERLIPIDEMIA 06/14/2007  . Morbid obesity 09/04/2006  . ANXIETY 09/04/2006  . DEPRESSION 09/04/2006  . COMMON MIGRAINE 06/14/2007  . BLEPHARITIS, LEFT 05/21/2008  . OTITIS MEDIA, ACUTE, LEFT 03/27/2008  . HYPERTENSION 10/11/2007  . SINUSITIS- ACUTE-NOS 10/11/2007  . URI 10/29/2009  . ALLERGIC RHINITIS 10/30/2009  . ASTHMA 12/25/2006  . ASTHMA, WITH ACUTE EXACERBATION 05/03/2009  . URTICARIA 05/03/2009  . DISC DISEASE, CERVICAL 10/29/2009  . LOW BACK PAIN 09/04/2006  . TENOSYNOVITIS, WRIST 10/11/2007  . GANGLION CYST, WRIST, LEFT 10/11/2007  . Headache 10/23/2008   Past Surgical History  Procedure Date  . Back surgury 01/2003    s/p lumbar disc    reports that she has been smoking.  She does not have any smokeless tobacco history on file. She reports that she drinks alcohol. She reports that she does not use illicit drugs. family history includes Anxiety disorder in her father and Diabetes in her father. No Known Allergies Current Outpatient Prescriptions on File Prior to Visit  Medication Sig Dispense Refill  . EPINEPHrine (EPIPEN 2-PAK) 0.3 mg/0.3 mL DEVI  Inject 0.3 mg into the muscle. Use as directed         Review of Systems Review of Systems  Constitutional: Negative for diaphoresis and unexpected weight change.  HENT: Negative for drooling and tinnitus.   Eyes: Negative for photophobia and visual disturbance.  Respiratory: Negative for choking and stridor.   Gastrointestinal: Negative for vomiting and blood in stool.  Genitourinary: Negative for hematuria and decreased urine volume.     Objective:   Physical Exam BP 102/70  Pulse 119  Temp(Src) 99 F (37.2 C) (Oral)  Ht 5\' 11"  (1.803 m)  Wt 230 lb 4 oz (104.441 kg)  BMI 32.11 kg/m2  SpO2 97% Physical Exam  VS noted, mild ill Constitutional: Pt appears well-developed and well-nourished.  HENT: Head: Normocephalic.  Right Ear: External ear normal.  Left Ear: External ear normal.  Bilat tm's mild erythema.  Sinus nontender.  Pharynx mild erythema Eyes: Conjunctivae and EOM are normal. Pupils are equal, round, and reactive to light.  Neck: Normal range of motion. Neck supple.  Cardiovascular: Normal rate and regular rhythm.   Pulmonary/Chest: Effort normal and breath sounds mild decreaed, bilat wheeze mild.  Neurological: Pt is alert. No cranial nerve deficit. motor/gait intact Skin: Skin is warm. No erythema.  Psychiatric: Pt behavior is normal. Thought content normal. 1+ nervous    Assessment & Plan:

## 2010-10-15 ENCOUNTER — Encounter: Payer: Self-pay | Admitting: Internal Medicine

## 2010-10-15 NOTE — Assessment & Plan Note (Signed)
stable overall by hx and exam, and pt to continue medical treatment as before, for med refill today 

## 2010-10-15 NOTE — Assessment & Plan Note (Signed)
stable overall by hx and exam, most recent data reviewed with pt, and pt to continue medical treatment as before  Lab Results  Component Value Date   WBC 10.2 10/23/2008   HGB 15.0 10/23/2008   HCT 42.9 10/23/2008   PLT 184.0 10/23/2008   GLUCOSE 77 10/23/2008   CHOL 177 10/23/2008   TRIG 99.0 10/23/2008   HDL 34.50* 10/23/2008   LDLCALC 123* 10/23/2008   ALT 19 10/23/2008   AST 17 10/23/2008   NA 138 10/23/2008   K 3.8 10/23/2008   CL 105 10/23/2008   CREATININE 0.8 10/23/2008   BUN 13 10/23/2008   CO2 26 10/23/2008   TSH 1.04 10/23/2008

## 2010-10-15 NOTE — Assessment & Plan Note (Signed)
Mild to mod, for antibx course,  to f/u any worsening symptoms or concerns 

## 2010-10-25 ENCOUNTER — Telehealth: Payer: Self-pay

## 2010-10-25 MED ORDER — AZITHROMYCIN 250 MG PO TABS: ORAL_TABLET | ORAL | Status: DC

## 2010-10-25 MED ORDER — AZITHROMYCIN 250 MG PO TABS: ORAL_TABLET | ORAL | Status: AC

## 2010-10-25 MED ORDER — FLUCONAZOLE 150 MG PO TABS: ORAL_TABLET | ORAL | Status: DC

## 2010-10-25 MED ORDER — FLUCONAZOLE 150 MG PO TABS: ORAL_TABLET | ORAL | Status: AC

## 2010-10-25 NOTE — Telephone Encounter (Signed)
Pt advised.

## 2010-10-25 NOTE — Telephone Encounter (Signed)
Pt called stating that the ABX she was prescribed has not help with Bronchitis sxs. Pt has also developed a yeast infection. Pt is requesting another course of ABX and a Rx for Diflucan, please advise.

## 2010-11-02 ENCOUNTER — Other Ambulatory Visit: Payer: Self-pay | Admitting: Obstetrics and Gynecology

## 2011-03-27 ENCOUNTER — Emergency Department: Payer: Self-pay | Admitting: Emergency Medicine

## 2011-04-07 ENCOUNTER — Encounter (HOSPITAL_COMMUNITY)
Admission: EM | Disposition: A | Discharge status: Home / Self Care | Payer: Self-pay | Source: Home / Self Care | Attending: Obstetrics and Gynecology

## 2011-04-07 ENCOUNTER — Emergency Department (HOSPITAL_COMMUNITY): Payer: PRIVATE HEALTH INSURANCE

## 2011-04-07 ENCOUNTER — Ambulatory Visit: Admit: 2011-04-07 | Payer: Self-pay | Admitting: Obstetrics and Gynecology

## 2011-04-07 ENCOUNTER — Ambulatory Visit (HOSPITAL_COMMUNITY)
Admission: EM | Admit: 2011-04-07 | Discharge: 2011-04-08 | Disposition: A | Discharge status: Home / Self Care | Payer: PRIVATE HEALTH INSURANCE | Attending: Obstetrics and Gynecology | Admitting: Obstetrics and Gynecology

## 2011-04-07 ENCOUNTER — Encounter (HOSPITAL_COMMUNITY): Payer: Self-pay | Admitting: Anesthesiology

## 2011-04-07 ENCOUNTER — Encounter (HOSPITAL_COMMUNITY): Payer: Self-pay | Admitting: *Deleted

## 2011-04-07 ENCOUNTER — Encounter (HOSPITAL_COMMUNITY): Payer: Self-pay

## 2011-04-07 ENCOUNTER — Inpatient Hospital Stay (HOSPITAL_COMMUNITY): Payer: PRIVATE HEALTH INSURANCE | Admitting: Anesthesiology

## 2011-04-07 DIAGNOSIS — O00109 Unspecified tubal pregnancy without intrauterine pregnancy: Principal | ICD-10-CM | POA: Insufficient documentation

## 2011-04-07 DIAGNOSIS — O009 Unspecified ectopic pregnancy without intrauterine pregnancy: Secondary | ICD-10-CM

## 2011-04-07 DIAGNOSIS — F411 Generalized anxiety disorder: Secondary | ICD-10-CM

## 2011-04-07 HISTORY — PX: LAPAROSCOPY: SHX197

## 2011-04-07 LAB — DIFFERENTIAL
Basophils Relative: 0 % (ref 0–1)
Eosinophils Relative: 1 % (ref 0–5)
Lymphocytes Relative: 14 % (ref 12–46)
Monocytes Relative: 8 % (ref 3–12)
Neutrophils Relative %: 77 % (ref 43–77)
Smear Review: ADEQUATE

## 2011-04-07 LAB — COMPREHENSIVE METABOLIC PANEL
ALT: 12 U/L (ref 0–35)
AST: 13 U/L (ref 0–37)
Alkaline Phosphatase: 39 U/L (ref 39–117)
CO2: 22 mEq/L (ref 19–32)
Calcium: 9.5 mg/dL (ref 8.4–10.5)
Chloride: 105 mEq/L (ref 96–112)
GFR calc Af Amer: >90 mL/min (ref >90)
GFR calc non Af Amer: >90 mL/min (ref >90)
Glucose, Bld: 107 mg/dL — ABNORMAL HIGH (ref 70–99)
Potassium: 3.7 mEq/L (ref 3.5–5.1)
Sodium: 138 mEq/L (ref 135–145)
Total Bilirubin: 0.8 mg/dL (ref 0.3–1.2)

## 2011-04-07 LAB — URINALYSIS, ROUTINE W REFLEX MICROSCOPIC
Bilirubin Urine: NEGATIVE
Glucose, UA: NEGATIVE mg/dL
Protein, ur: NEGATIVE mg/dL

## 2011-04-07 LAB — CBC
Hemoglobin: 14.5 g/dL (ref 12.0–15.0)
MCH: 32.6 pg (ref 26.0–34.0)
Platelets: 214 10*3/uL (ref 150–400)
RBC: 4.45 MIL/uL (ref 3.87–5.11)
WBC: 12.3 10*3/uL — ABNORMAL HIGH (ref 4.0–10.5)

## 2011-04-07 LAB — URINE MICROSCOPIC-ADD ON

## 2011-04-07 LAB — PREGNANCY, URINE: Preg Test, Ur: POSITIVE — AB

## 2011-04-07 LAB — WET PREP, GENITAL
Clue Cells Wet Prep HPF POC: NONE SEEN
Trich, Wet Prep: NONE SEEN
Yeast Wet Prep HPF POC: NONE SEEN

## 2011-04-07 LAB — HCG, QUANTITATIVE, PREGNANCY: hCG, Beta Chain, Quant, S: 2757 m[IU]/mL — ABNORMAL HIGH (ref <5)

## 2011-04-07 SURGERY — LAPAROSCOPY OPERATIVE
Anesthesia: General | Site: Abdomen | Wound class: Clean Contaminated

## 2011-04-07 MED ORDER — BUPIVACAINE HCL (PF) 0.25 % IJ SOLN
INTRAMUSCULAR | Status: DC | PRN
  Administered 2011-04-07: 16 mL

## 2011-04-07 MED ORDER — KETOROLAC TROMETHAMINE 30 MG/ML IJ SOLN: 30.0000 mg | Freq: Once | INTRAMUSCULAR | Status: DC

## 2011-04-07 MED ORDER — CITRIC ACID-SODIUM CITRATE 334-500 MG/5ML PO SOLN
30.0000 mL | Freq: Once | ORAL | Status: AC
  Administered 2011-04-07: 30 mL via ORAL

## 2011-04-07 MED ORDER — MIDAZOLAM HCL 5 MG/5ML IJ SOLN
INTRAMUSCULAR | Status: DC | PRN
  Administered 2011-04-07 (×2): 1 mg via INTRAVENOUS

## 2011-04-07 MED ORDER — ONDANSETRON HCL 4 MG/2ML IJ SOLN
INTRAMUSCULAR | Status: DC | PRN
  Administered 2011-04-07: 4 mg via INTRAVENOUS

## 2011-04-07 MED ORDER — KETOROLAC TROMETHAMINE 30 MG/ML IJ SOLN
INTRAMUSCULAR | Status: DC | PRN
  Administered 2011-04-07: 30 mg via INTRAVENOUS

## 2011-04-07 MED ORDER — FENTANYL CITRATE 0.05 MG/ML IJ SOLN
INTRAMUSCULAR | Status: DC | PRN
  Administered 2011-04-07 (×3): 100 ug via INTRAVENOUS
  Administered 2011-04-07: 50 ug via INTRAVENOUS

## 2011-04-07 MED ORDER — FENTANYL CITRATE 0.05 MG/ML IJ SOLN
100.0000 ug | Freq: Once | INTRAMUSCULAR | Status: AC
  Administered 2011-04-07: 100 ug via INTRAVENOUS
  Filled 2011-04-07: qty 2

## 2011-04-07 MED ORDER — BUTORPHANOL TARTRATE 2 MG/ML IJ SOLN
1.0000 mg | INTRAMUSCULAR | Status: DC | PRN
  Administered 2011-04-07: 2 mg via INTRAVENOUS
  Filled 2011-04-07: qty 1

## 2011-04-07 MED ORDER — ZOLPIDEM TARTRATE 5 MG PO TABS: 5.0000 mg | ORAL_TABLET | Freq: Every evening | ORAL | Status: DC | PRN

## 2011-04-07 MED ORDER — ONDANSETRON HCL 4 MG/2ML IJ SOLN
4.0000 mg | Freq: Once | INTRAMUSCULAR | Status: AC
  Administered 2011-04-07: 4 mg via INTRAVENOUS
  Filled 2011-04-07: qty 2

## 2011-04-07 MED ORDER — MEPERIDINE HCL 25 MG/ML IJ SOLN: 6.2500 mg | INTRAMUSCULAR | Status: DC | PRN

## 2011-04-07 MED ORDER — ONDANSETRON HCL 4 MG/2ML IJ SOLN: 4.0000 mg | Freq: Four times a day (QID) | INTRAMUSCULAR | Status: DC | PRN

## 2011-04-07 MED ORDER — LORATADINE 10 MG PO TABS
10.0000 mg | ORAL_TABLET | Freq: Every day | ORAL | Status: DC
  Administered 2011-04-07 – 2011-04-08 (×2): 10 mg via ORAL
  Filled 2011-04-07 (×2): qty 1

## 2011-04-07 MED ORDER — DEXAMETHASONE SODIUM PHOSPHATE 10 MG/ML IJ SOLN
INTRAMUSCULAR | Status: DC | PRN
  Administered 2011-04-07: 10 mg via INTRAVENOUS

## 2011-04-07 MED ORDER — ROCURONIUM BROMIDE 100 MG/10ML IV SOLN
INTRAVENOUS | Status: DC | PRN
  Administered 2011-04-07: 5 mg via INTRAVENOUS
  Administered 2011-04-07: 25 mg via INTRAVENOUS

## 2011-04-07 MED ORDER — PROPOFOL 10 MG/ML IV EMUL
INTRAVENOUS | Status: DC | PRN
  Administered 2011-04-07: 160 mg via INTRAVENOUS

## 2011-04-07 MED ORDER — LACTATED RINGERS IR SOLN
Status: DC | PRN
  Administered 2011-04-07: 3000 mL

## 2011-04-07 MED ORDER — KETOROLAC TROMETHAMINE 30 MG/ML IJ SOLN
30.0000 mg | Freq: Four times a day (QID) | INTRAMUSCULAR | Status: DC
  Administered 2011-04-08 (×2): 30 mg via INTRAVENOUS
  Filled 2011-04-07 (×2): qty 1

## 2011-04-07 MED ORDER — ONDANSETRON HCL 4 MG PO TABS: 4.0000 mg | ORAL_TABLET | Freq: Four times a day (QID) | ORAL | Status: DC | PRN

## 2011-04-07 MED ORDER — ALPRAZOLAM 0.5 MG PO TABS
1.0000 mg | ORAL_TABLET | Freq: Three times a day (TID) | ORAL | Status: DC | PRN
  Administered 2011-04-07: 1 mg via ORAL
  Filled 2011-04-07: qty 2

## 2011-04-07 MED ORDER — ALBUTEROL SULFATE HFA 108 (90 BASE) MCG/ACT IN AERS
2.0000 | INHALATION_SPRAY | Freq: Four times a day (QID) | RESPIRATORY_TRACT | Status: DC | PRN
  Filled 2011-04-07: qty 6.7

## 2011-04-07 MED ORDER — DEXTROSE IN LACTATED RINGERS 5 % IV SOLN
INTRAVENOUS | Status: DC
  Administered 2011-04-08: 02:00:00 via INTRAVENOUS

## 2011-04-07 MED ORDER — LIDOCAINE HCL (CARDIAC) 20 MG/ML IV SOLN
INTRAVENOUS | Status: DC | PRN
  Administered 2011-04-07: 60 mg via INTRAVENOUS

## 2011-04-07 MED ORDER — HYDROMORPHONE HCL PF 1 MG/ML IJ SOLN: 1.0000 mg | Freq: Once | INTRAMUSCULAR | Status: DC

## 2011-04-07 MED ORDER — IBUPROFEN 800 MG PO TABS
800.0000 mg | ORAL_TABLET | Freq: Three times a day (TID) | ORAL | Status: DC | PRN
  Administered 2011-04-08: 800 mg via ORAL
  Filled 2011-04-07: qty 1

## 2011-04-07 MED ORDER — MENTHOL 3 MG MT LOZG: 1.0000 | LOZENGE | OROMUCOSAL | Status: DC | PRN

## 2011-04-07 MED ORDER — OXYCODONE-ACETAMINOPHEN 5-325 MG PO TABS
1.0000 | ORAL_TABLET | ORAL | Status: DC | PRN
  Administered 2011-04-07: 1 via ORAL
  Administered 2011-04-08: 2 via ORAL
  Filled 2011-04-07 (×2): qty 1
  Filled 2011-04-07: qty 2

## 2011-04-07 MED ORDER — FAMOTIDINE IN NACL 20-0.9 MG/50ML-% IV SOLN
INTRAVENOUS | Status: AC
  Administered 2011-04-07: 20 mg via INTRAVENOUS
  Filled 2011-04-07: qty 50

## 2011-04-07 MED ORDER — NEOSTIGMINE METHYLSULFATE 1 MG/ML IJ SOLN
INTRAMUSCULAR | Status: DC | PRN
  Administered 2011-04-07: 3 mg via INTRAVENOUS

## 2011-04-07 MED ORDER — HYDROMORPHONE HCL PF 1 MG/ML IJ SOLN
INTRAMUSCULAR | Status: AC
  Administered 2011-04-07: 0.5 mg via INTRAVENOUS
  Filled 2011-04-07: qty 1

## 2011-04-07 MED ORDER — SODIUM CHLORIDE 0.9 % IV BOLUS (SEPSIS)
1000.0000 mL | Freq: Once | INTRAVENOUS | Status: AC
  Administered 2011-04-07 (×2): 1000 mL via INTRAVENOUS

## 2011-04-07 MED ORDER — HYDROMORPHONE HCL PF 1 MG/ML IJ SOLN
0.2500 mg | INTRAMUSCULAR | Status: DC | PRN
  Administered 2011-04-07 (×4): 0.5 mg via INTRAVENOUS

## 2011-04-07 MED ORDER — CITRIC ACID-SODIUM CITRATE 334-500 MG/5ML PO SOLN
ORAL | Status: AC
  Administered 2011-04-07: 30 mL via ORAL
  Filled 2011-04-07: qty 15

## 2011-04-07 MED ORDER — KETOROLAC TROMETHAMINE 30 MG/ML IJ SOLN: 30.0000 mg | Freq: Four times a day (QID) | INTRAMUSCULAR | Status: DC

## 2011-04-07 MED ORDER — FAMOTIDINE IN NACL 20-0.9 MG/50ML-% IV SOLN
20.0000 mg | Freq: Once | INTRAVENOUS | Status: AC
  Administered 2011-04-07: 20 mg via INTRAVENOUS

## 2011-04-07 MED ORDER — CEFAZOLIN SODIUM 1-5 GM-% IV SOLN
1.0000 g | INTRAVENOUS | Status: AC
  Administered 2011-04-07: 1 g via INTRAVENOUS
  Filled 2011-04-07: qty 50

## 2011-04-07 MED ORDER — GLYCOPYRROLATE 0.2 MG/ML IJ SOLN
INTRAMUSCULAR | Status: DC | PRN
  Administered 2011-04-07: 0.2 mg via INTRAVENOUS
  Administered 2011-04-07: 0.3 mg via INTRAVENOUS

## 2011-04-07 MED ORDER — METOCLOPRAMIDE HCL 5 MG/ML IJ SOLN: 10.0000 mg | Freq: Once | INTRAMUSCULAR | Status: DC | PRN

## 2011-04-07 MED ORDER — HYDROMORPHONE HCL PF 1 MG/ML IJ SOLN
1.0000 mg | Freq: Once | INTRAMUSCULAR | Status: AC
  Administered 2011-04-07: 1 mg via INTRAVENOUS
  Filled 2011-04-07: qty 1

## 2011-04-07 MED ORDER — LACTATED RINGERS IV SOLN
INTRAVENOUS | Status: DC | PRN
  Administered 2011-04-07: 15:00:00 via INTRAVENOUS

## 2011-04-07 MED ORDER — SUCCINYLCHOLINE CHLORIDE 20 MG/ML IJ SOLN
INTRAMUSCULAR | Status: DC | PRN
  Administered 2011-04-07: 120 mg via INTRAVENOUS

## 2011-04-07 SURGICAL SUPPLY — 28 items
CABLE HIGH FREQUENCY MONO STRZ (ELECTRODE) IMPLANT
CATH ROBINSON RED A/P 16FR (CATHETERS) ×2 IMPLANT
CLOTH BEACON ORANGE TIMEOUT ST (SAFETY) ×2 IMPLANT
DERMABOND ADVANCED (GAUZE/BANDAGES/DRESSINGS) ×1
DERMABOND ADVANCED .7 DNX12 (GAUZE/BANDAGES/DRESSINGS) ×1 IMPLANT
DRSG COVADERM PLUS 2X2 (GAUZE/BANDAGES/DRESSINGS) ×2 IMPLANT
DRSG TEGADERM 4X4.75 (GAUZE/BANDAGES/DRESSINGS) ×2 IMPLANT
GLOVE BIO SURGEON STRL SZ7 (GLOVE) ×4 IMPLANT
GLOVE BIOGEL PI IND STRL 6.5 (GLOVE) IMPLANT
GLOVE BIOGEL PI INDICATOR 6.5 (GLOVE)
GOWN PREVENTION PLUS LG XLONG (DISPOSABLE) ×4 IMPLANT
NEEDLE INSUFFLATION 14GA 120MM (NEEDLE) ×2 IMPLANT
NS IRRIG 1000ML POUR BTL (IV SOLUTION) ×2 IMPLANT
PACK LAPAROSCOPY BASIN (CUSTOM PROCEDURE TRAY) ×2 IMPLANT
POUCH SPECIMEN RETRIEVAL 10MM (ENDOMECHANICALS) IMPLANT
PROTECTOR NERVE ULNAR (MISCELLANEOUS) ×2 IMPLANT
SEALER TISSUE G2 CVD JAW 35 (ENDOMECHANICALS) ×1 IMPLANT
SEALER TISSUE G2 CVD JAW 45CM (ENDOMECHANICALS) ×3 IMPLANT
SET IRRIG TUBING LAPAROSCOPIC (IRRIGATION / IRRIGATOR) IMPLANT
SUT MON AB 2-0 CT1 36 (SUTURE) ×6 IMPLANT
SUT VIC AB 2-0 CT1 18 (SUTURE) ×4 IMPLANT
SUT VICRYL 0 TIES 12 18 (SUTURE) ×2 IMPLANT
SUT VICRYL RAPIDE 4/0 PS 2 (SUTURE) ×4 IMPLANT
TOWEL OR 17X24 6PK STRL BLUE (TOWEL DISPOSABLE) ×4 IMPLANT
TROCAR Z-THREAD BLADED 11X100M (TROCAR) ×2 IMPLANT
TROCAR Z-THREAD BLADED 5X100MM (TROCAR) ×4 IMPLANT
WARMER LAPAROSCOPE (MISCELLANEOUS) ×2 IMPLANT
WATER STERILE IRR 1000ML POUR (IV SOLUTION) ×2 IMPLANT

## 2011-04-07 NOTE — ED Provider Notes (Addendum)
History     CSN: [id number]  Arrival date & time 04/07/11  [id number]   First MD Initiated Contact with Patient 04/07/11 [phone]      No chief complaint on file.   (Consider location/radiation/quality/duration/timing/severity/associated sxs/prior treatment) HPI  Patient presents to ER complaining of a 1 week hx of gradual onset lower abdominal pain and cramping with a multiple month hx of irregular menstrual cycles. Patient states she went to see her ObGyn, Dr. Rana Snare, about the same complaint 3 days ago but had light menstruation at that time with lower abdominal pain and states "my urine looked clean, they did a pelvic exam and I was told to stop my birth control but that was it." Patient states that since then her vaginal bleeding has increased as well as the constant lower abdominal cramping. Patient states that over the last couple of weeks she has felt hot and chilled at night and noted an occassional fever with Tmax 102. Patient is G1P0, one miscarriage last year requiring D&C. Denies hx of abdominal surgeries. Patient has been taking OTC pain meds without relief of pain. Denies dysuria or hematuria. Mild nausea but denies vomiting or diarrhea. Denies known sick contacts. Denies aggravating or alleviating factors.   Past Medical History  Diagnosis Date  . HYPERLIPIDEMIA 06/14/2007  . Morbid obesity 09/04/2006  . ANXIETY 09/04/2006  . DEPRESSION 09/04/2006  . COMMON MIGRAINE 06/14/2007  . BLEPHARITIS, LEFT 05/21/2008  . OTITIS MEDIA, ACUTE, LEFT 03/27/2008  . HYPERTENSION 10/11/2007  . SINUSITIS- ACUTE-NOS 10/11/2007  . URI 10/29/2009  . ALLERGIC RHINITIS 10/30/2009  . ASTHMA 12/25/2006  . ASTHMA, WITH ACUTE EXACERBATION 05/03/2009  . URTICARIA 05/03/2009  . DISC DISEASE, CERVICAL 10/29/2009  . LOW BACK PAIN 09/04/2006  . TENOSYNOVITIS, WRIST 10/11/2007  . GANGLION CYST, WRIST, LEFT 10/11/2007  . Headache 10/23/2008    Past Surgical History  Procedure Date  . Back surgury 01/2003    s/p lumbar  disc    Family History  Problem Relation Age of Onset  . Anxiety disorder Father   . Diabetes Father     History  Substance Use Topics  . Smoking status: Current Everyday Smoker  . Smokeless tobacco: Not on file  . Alcohol Use: Yes    OB History    Grav Para Term Preterm Abortions TAB SAB Ect Mult Living                  Review of Systems  All other systems reviewed and are negative.    Allergies  Review of patient's allergies indicates no known allergies.  Home Medications   Current Outpatient Rx  Name Route Sig Dispense Refill  . ALBUTEROL SULFATE HFA 108 (90 BASE) MCG/ACT IN AERS Inhalation Inhale 2 puffs into the lungs every 6 (six) hours as needed.    . ALPRAZOLAM 1 MG PO TABS Oral Take 1 tablet (1 mg total) by mouth 3 (three) times daily as needed. For nerves 90 tablet 5  . CETIRIZINE HCL 10 MG PO TABS Oral Take 10 mg by mouth daily.    Marland Kitchen EPINEPHRINE 0.3 MG/0.3ML IJ DEVI Intramuscular Inject 0.3 mg into the muscle. Use as directed     . HYDROCODONE-ACETAMINOPHEN 5-500 MG PO TABS Oral Take 1 tablet by mouth every 8 (eight) hours as needed for pain. 1 by mouth two times a day as needed for pain 60 tablet 5  . IBUPROFEN 800 MG PO TABS Oral Take 800 mg by mouth every 8 (eight) hours as needed.  BP 138/88  Pulse 112  Temp(Src) 98.2 F (36.8 C) (Oral)  Resp 16  SpO2 96%  Physical Exam  Nursing note and vitals reviewed. Constitutional: She is oriented to person, place, and time. She appears well-developed and well-nourished. No distress.  HENT:  Head: Normocephalic and atraumatic.  Eyes: Conjunctivae are normal.  Neck: Normal range of motion. Neck supple.  Cardiovascular: Regular rhythm, normal heart sounds and intact distal pulses.  Tachycardia present.  Exam reveals no gallop and no friction rub.   No murmur heard. Pulmonary/Chest: Effort normal and breath sounds normal. No respiratory distress. She has no wheezes. She has no rales. She exhibits no  tenderness.  Abdominal: Bowel sounds are normal. She exhibits no distension and no mass. There is tenderness. There is guarding.       TTP of entire lower abdomen with guarding but no rigidity.   Genitourinary: Vagina normal. There is no tenderness or lesion on the right labia. There is no tenderness or lesion on the left labia. Cervix exhibits no motion tenderness, no discharge and no friability.       Mild to moderate TTP of left and right adnexal region without palpable mass.   Musculoskeletal: Normal range of motion. She exhibits no edema and no tenderness.  Neurological: She is alert and oriented to person, place, and time.  Skin: Skin is warm and dry. No rash noted. She is not diaphoretic. No erythema.  Psychiatric: She has a normal mood and affect.    ED Course  Procedures (including critical care time)  IV dilaudid and zofran  11:57 AM Verbal report of [place] report given to Minta Balsam by Dr. Kyung Rudd for positive ectopic with concern for rupture  11:58 AM I discussed ultrasound findings with Dr. Marcelle Overlie who is on-call for [facility]. He requests immediate transfer to [facility] for further evaluation and management. Patient blood pressure is stable at this time. We'll continue to monitor blood pressure and pain control in ER and in route for transfer.  Dr. Hyman Hopes at bedside to evaluate patient.  Labs Reviewed  CBC - Abnormal; Notable for the following:    WBC 12.3 (*)    All other components within normal limits  DIFFERENTIAL - Abnormal; Notable for the following:    Neutro Abs 9.5 (*)    All other components within normal limits  COMPREHENSIVE METABOLIC PANEL - Abnormal; Notable for the following:    Glucose, Bld 107 (*)    All other components within normal limits  URINALYSIS, ROUTINE W REFLEX MICROSCOPIC - Abnormal; Notable for the following:    APPearance CLOUDY (*)    Hgb urine dipstick LARGE (*)    All other components within normal limits  PREGNANCY,  URINE - Abnormal; Notable for the following:    Preg Test, Ur POSITIVE (*)    All other components within normal limits  WET PREP, GENITAL - Abnormal; Notable for the following:    WBC, Wet Prep HPF POC RARE (*)    All other components within normal limits  HCG, QUANTITATIVE, PREGNANCY - Abnormal; Notable for the following:    hCG, Beta Chain, Quant, S 2757 (*)    All other components within normal limits  URINE MICROSCOPIC-ADD ON - Abnormal; Notable for the following:    Squamous Epithelial / LPF MANY (*)    All other components within normal limits  LIPASE, BLOOD  ABO/RH  GC/CHLAMYDIA PROBE AMP, GENITAL   US Ob Transvaginal  04/07/2011  *RADIOLOGY REPORT*  Clinical Data:  Positive pregnancy test.  Rule out ectopic gestation  OBSTETRIC <14 WK [place] AND TRANSVAGINAL OB US  Technique:  Both transabdominal and transvaginal ultrasound examinations were performed for complete evaluation of the gestation as well as the maternal uterus, adnexal regions, and pelvic cul-de-sac.  Transvaginal technique was performed to assess early pregnancy.  Comparison:  None.  Intrauterine gestational sac:  Not seen Yolk sac: Not seen Embryo: Not seen Cardiac Activity: Not applicable Heart Rate:  bpm  MSD:   mm      w     d CRL:    mm     w     d        [place] EDC:  Maternal uterus/adnexae: In the left adnexa there is a ring lesion identified measuring 3.0 x 3.0 cm.  The left ovary is seen separately on transverse cine views through the pelvis and measures 3.1 x 2.4 cm.  A large amount of complex fluid is identified within the pelvis extending from the cul-de-sac over the apex of the uterus and involving both adnexal regions.  Findings are strongly suspicious for a ruptured left ectopic gestation with associated hemoperitoneum.  The right ovary is well seen in transverse plane measuring 1.7 x 2.5 cm.  IMPRESSION: No evidence for intrauterine gestation.  Findings strongly suspicious for a left ectopic gestation with size as noted  above. Large amount of intrapelvic complex fluid and soft tissue compatible with associated hemoperitoneum and clot suggesting associated rupture.  Normal right ovary.  This report was called as a critical result to Florentina Addison, the patient's nurse at 11:40 am on 04/07/2011. The report was then relayed to Dr. Daleen Squibb the patient's physician.  Original Report Authenticated By: Bertha Stakes, M.D.     1. Ruptured ectopic pregnancy     CRITICAL CARE Performed by: Jenness Corner   Total critical care time: 65  Critical care time was exclusive of separately billable procedures and treating other patients.  Critical care was necessary to treat or prevent imminent or life-threatening deterioration.  Critical care was time spent personally by me on the following activities: development of treatment plan with patient and/or surrogate as well as nursing, discussions with consultants, evaluation of patient's response to treatment, examination of patient, obtaining history from patient or surrogate, ordering and performing treatments and interventions, ordering and review of laboratory studies, ordering and review of radiographic studies, pulse oximetry and re-evaluation of patient's condition.   MDM  VSS at time of transfer to [facility] for concern of ruptured ectopic.         Jenness Corner, PA 04/07/11 1256  Lenon Oms [place], [place] 05/03/11 1501

## 2011-04-07 NOTE — H&P (Signed)
Martha Thompson is an 26 y.o. female. Martha.Thompson  Pertinent Gynecological History: Menses: irreg Bleeding: intermenstrual bleeding Contraception: OCP (estrogen/progesterone) DES exposure: denies Blood transfusions: none Sexually transmitted diseases: no past history Previous GYN Procedures: DNC  Last mammogram: na Date: unk Last pap: unk Date: unk OB History: G3, P0   Menstrual History: Menarche age: unk Patient's last menstrual period was 03/10/2010.    Past Medical History  Diagnosis Date  . HYPERLIPIDEMIA 06/14/2007  . Morbid obesity 09/04/2006  . ANXIETY 09/04/2006  . DEPRESSION 09/04/2006  . COMMON MIGRAINE 06/14/2007  . BLEPHARITIS, LEFT 05/21/2008  . OTITIS MEDIA, ACUTE, LEFT 03/27/2008  . HYPERTENSION 10/11/2007  . SINUSITIS- ACUTE-NOS 10/11/2007  . URI 10/29/2009  . ALLERGIC RHINITIS 10/30/2009  . ASTHMA 12/25/2006  . ASTHMA, WITH ACUTE EXACERBATION 05/03/2009  . URTICARIA 05/03/2009  . DISC DISEASE, CERVICAL 10/29/2009  . LOW BACK PAIN 09/04/2006  . TENOSYNOVITIS, WRIST 10/11/2007  . GANGLION CYST, WRIST, LEFT 10/11/2007  . Headache 10/23/2008    Past Surgical History  Procedure Date  . Back surgury 01/2003    s/p lumbar disc    Family History  Problem Relation Age of Onset  . Anxiety disorder Father   . Diabetes Father     Social History:  reports that she has been smoking.  She does not have any smokeless tobacco history on file. She reports that she drinks alcohol. She reports that she does not use illicit drugs.  Allergies: No Known Allergies   (Not in a hospital admission)  Review of Systems  Constitutional: Negative.   HENT: Negative.   Eyes: Negative.   Respiratory: Negative.   Cardiovascular: Negative.   Gastrointestinal: Positive for abdominal pain.  Genitourinary: Negative.   Musculoskeletal: Negative.   Skin: Negative.   Neurological: Negative.   Endo/Heme/Allergies: Negative.   Psychiatric/Behavioral: Negative.     Blood pressure 137/70,  pulse 101, temperature 98.8 F (37.1 C), temperature source Oral, resp. rate 20, last menstrual period 03/10/2010, SpO2 99.00%. Physical Exam  Constitutional: She is oriented to person, place, and time. She appears well-developed and well-nourished.  HENT:  Head: Normocephalic and atraumatic.  Eyes: EOM are normal. Pupils are equal, round, and reactive to light.  Neck: Normal range of motion. Neck supple.  Cardiovascular: Normal rate and regular rhythm.   Respiratory: Effort normal and breath sounds normal.  GI: There is tenderness.       Tender LQ w/o rebound, + BS  Genitourinary:       Per ED exam  Musculoskeletal: Normal range of motion.  Neurological: She is alert and oriented to person, place, and time. She has normal reflexes.  Skin: Skin is warm and dry.    Results for orders placed during the hospital encounter of 04/07/11 (from the past 24 hour(s))  CBC     Status: Abnormal   Collection Time   04/07/11  9:22 AM      Component Value Range   WBC 12.3 (*) 4.0 - 10.5 (K/uL)   RBC 4.45  3.87 - 5.11 (MIL/uL)   Hemoglobin 14.5  12.0 - 15.0 (g/dL)   HCT 16.1  09.6 - 04.5 (%)   MCV 91.0  78.0 - 100.0 (fL)   MCH 32.6  26.0 - 34.0 (pg)   MCHC 35.8  30.0 - 36.0 (g/dL)   RDW 40.9  81.1 - 91.4 (%)   Platelets 214  150 - 400 (K/uL)  DIFFERENTIAL     Status: Abnormal   Collection Time   04/07/11  9:22 AM      Component Value Range   Neutrophils Relative 77  43 - 77 (%)   Lymphocytes Relative 14  12 - 46 (%)   Monocytes Relative 8  3 - 12 (%)   Eosinophils Relative 1  0 - 5 (%)   Basophils Relative 0  0 - 1 (%)   Neutro Abs 9.5 (*) 1.7 - 7.7 (K/uL)   Lymphs Abs 1.7  0.7 - 4.0 (K/uL)   Monocytes Absolute 1.0  0.1 - 1.0 (K/uL)   Eosinophils Absolute 0.1  0.0 - 0.7 (K/uL)   Basophils Absolute 0.0  0.0 - 0.1 (K/uL)   Smear Review PLATELETS APPEAR ADEQUATE    COMPREHENSIVE METABOLIC PANEL     Status: Abnormal   Collection Time   04/07/11  9:22 AM      Component Value Range    Sodium 138  135 - 145 (mEq/L)   Potassium 3.7  3.5 - 5.1 (mEq/L)   Chloride 105  96 - 112 (mEq/L)   CO2 22  19 - 32 (mEq/L)   Glucose, Bld 107 (*) 70 - 99 (mg/dL)   BUN 16  6 - 23 (mg/dL)   Creatinine, Ser 1.61  0.50 - 1.10 (mg/dL)   Calcium 9.5  8.4 - 09.6 (mg/dL)   Total Protein 6.9  6.0 - 8.3 (g/dL)   Albumin 3.7  3.5 - 5.2 (g/dL)   AST 13  0 - 37 (U/L)   ALT 12  0 - 35 (U/L)   Alkaline Phosphatase 39  39 - 117 (U/L)   Total Bilirubin 0.8  0.3 - 1.2 (mg/dL)   GFR calc non Af Amer >90  >90 (mL/min)   GFR calc Af Amer >90  >90 (mL/min)  LIPASE, BLOOD     Status: Normal   Collection Time   04/07/11  9:22 AM      Component Value Range   Lipase 19  11 - 59 (U/L)  URINALYSIS, ROUTINE W REFLEX MICROSCOPIC     Status: Abnormal   Collection Time   04/07/11  9:46 AM      Component Value Range   Color, Urine YELLOW  YELLOW    APPearance CLOUDY (*) CLEAR    Specific Gravity, Urine 1.017  1.005 - 1.030    pH 7.0  5.0 - 8.0    Glucose, UA NEGATIVE  NEGATIVE (mg/dL)   Hgb urine dipstick LARGE (*) NEGATIVE    Bilirubin Urine NEGATIVE  NEGATIVE    Ketones, ur NEGATIVE  NEGATIVE (mg/dL)   Protein, ur NEGATIVE  NEGATIVE (mg/dL)   Urobilinogen, UA 1.0  0.0 - 1.0 (mg/dL)   Nitrite NEGATIVE  NEGATIVE    Leukocytes, UA NEGATIVE  NEGATIVE   PREGNANCY, URINE     Status: Abnormal   Collection Time   04/07/11  9:46 AM      Component Value Range   Preg Test, Ur POSITIVE (*) NEGATIVE   URINE MICROSCOPIC-ADD ON     Status: Abnormal   Collection Time   04/07/11  9:46 AM      Component Value Range   Squamous Epithelial / LPF MANY (*) RARE    RBC / HPF 21-50  <3 (RBC/hpf)   Bacteria, UA RARE  RARE   WET PREP, GENITAL     Status: Abnormal   Collection Time   04/07/11 10:05 AM      Component Value Range   Yeast Wet Prep HPF POC NONE SEEN  NONE SEEN  Trich, Wet Prep NONE SEEN  NONE SEEN    Clue Cells Wet Prep HPF POC NONE SEEN  NONE SEEN    WBC, Wet Prep HPF POC RARE (*) NONE SEEN   ABO/RH      Status: Normal   Collection Time   04/07/11 10:09 AM      Component Value Range   ABO/RH(D) O POS     No rh immune globuloin NOT A RH IMMUNE GLOBULIN CANDIDATE, PT RH POSITIVE    HCG, QUANTITATIVE, PREGNANCY     Status: Abnormal   Collection Time   04/07/11 10:15 AM      Component Value Range   hCG, Beta Chain, Quant, S 2757 (*) <5 (mIU/mL)    US Ob Transvaginal  04/07/2011  *RADIOLOGY REPORT*  Clinical Data: Positive pregnancy test.  Rule out ectopic gestation  OBSTETRIC <14 WK [place] AND TRANSVAGINAL OB US  Technique:  Both transabdominal and transvaginal ultrasound examinations were performed for complete evaluation of the gestation as well as the maternal uterus, adnexal regions, and pelvic cul-de-sac.  Transvaginal technique was performed to assess early pregnancy.  Comparison:  None.  Intrauterine gestational sac:  Not seen Yolk sac: Not seen Embryo: Not seen Cardiac Activity: Not applicable Heart Rate:  bpm  MSD:   mm      w     d CRL:    mm     w     d        [place] EDC:  Maternal uterus/adnexae: In the left adnexa there is a ring lesion identified measuring 3.0 x 3.0 cm.  The left ovary is seen separately on transverse cine views through the pelvis and measures 3.1 x 2.4 cm.  A large amount of complex fluid is identified within the pelvis extending from the cul-de-sac over the apex of the uterus and involving both adnexal regions.  Findings are strongly suspicious for a ruptured left ectopic gestation with associated hemoperitoneum.  The right ovary is well seen in transverse plane measuring 1.7 x 2.5 cm.  IMPRESSION: No evidence for intrauterine gestation.  Findings strongly suspicious for a left ectopic gestation with size as noted above. Large amount of intrapelvic complex fluid and soft tissue compatible with associated hemoperitoneum and clot suggesting associated rupture.  Normal right ovary.  This report was called as a critical result to Florentina Addison, the patient's nurse at 11:40 am on 04/07/2011. The  report was then relayed to Dr. Daleen Squibb the patient's physician.  Original Report Authenticated By: Bertha Stakes, M.D.    Assessment/Plan: Pt seen in office earlier this week, told to D/C OCP, cont probs with LQ pain and daily vag bleeding, presents to [facility] ED with + [facility] and [place] susp for L ectopic pregnancy with + fluid in cul de sac, stable Hgb>>transferred to [facility].  Discussed DL poss EL with either partial or entire salpingectomy.  Poss need for EL Burna Cash all discussed  Meriel Pica 04/07/2011, 1:48 PM

## 2011-04-07 NOTE — ED Notes (Signed)
Pt states that for the past couple of days she has been having severe left lower abdominal pain that has continued to get worse. She went to pcp ob md dr.lowe and states that they did not do any pregnancy test or ultrasound but only completed a pelvic exam. She states that she was told everything was ok and was sent home. After she got home her pain had continued to get worse and worse and this morning she states that the pain was so sharp, sudden, stabbing, and severe that she had to come in for eval. She states that she has been having increasing vaginal bleeding with some minor clots which seem to coincide with the increase in pain. Pt states that she has been nauseated but denies any vomiting. Iv started and labs obtained. Protocols initiated. Family at the bedside. Pt given meds and transported over to ultrasound. Upon return emergency call was placed stating that pt had ruptured left sided ectopic pregnancy and there was blood showing in her abdominal area. Critical results made aware to edp and pa and [facility]. Mother and father of the patient have been contacted and updated with plan of care per the request of the patient. [facility] given [facility] and report also called to [facility]. Nursing offered to have consent form for surgery completed pta but [facility] stated that a lot of the times the consents can change from dr to dr so to just wait until she arrived to complete that part of the paperwork. Pt medicated and pt' fiance informed of plan of care. Appropriate papers given to ems and pt transported [facility] at 12:50.

## 2011-04-07 NOTE — MAU Note (Signed)
DR Marcelle Overlie in

## 2011-04-07 NOTE — Op Note (Signed)
Preoperative diagnosis: Probable left leaking ectopic pregnancy  Postoperative diagnosis: Left partially ruptured tubal pregnancy, right adnexal adhesions  Procedure: Diagnostic laparoscopy with lysis of adhesions, left salpingectomy  Surgeon: Marcelle Overlie  EBL: 600 cc, mostly old blood clot.  Specimens removed: Left tube including ectopic pregnancy, to pathology  , Case: None  Drains: In and out Foley catheter  Procedure and findings:  The patient taken the operating room after an adequate level of general anesthesia was obtained with the legs in stirrups the abdomen perineum and vagina were prepped and draped in the usual fashion for laparoscopy. The bladder was drained of 500 cc, he UA carried out uterus is midposition normal size mobile adnexa negative. Holter tenaculum was positioned. Attention directed to the abdomen where the subumbilical area was infiltrated with quarter percent Marcaine plain small incision was made in the varies needle was introduced without difficulty. Its intra-abdominal position were was verified by pressure water testing. After a 2-1/2-3 L pneumoperitoneum syncopated lap scopic trocar and sleeve were then introduced without difficulty. Hemoperitoneum was noted in the cul-de-sac upon initial inspection. Patient was then placed in slight Trendelenburg 3 finger breaths above the symphysis in the midline a 5 mm trocar was inserted under direct visualization. With the uterus anteflexed and the patient in Trendelenburg the knee Schotz suction irrigator was then used to aspirate all blood, this revealed that there were some omental adhesions into the area the right tube no bowel stuck into that area these adhesions were lysed at their insertion with the in seal device, this was hemostatic once this was out of the way the right tube and every were carefully inspected and appeared to be normal as did the upper abdomen the cul-de-sac was free and clear the uterus was otherwise normal  there was a large partially leaking left tubal pregnancy a fair amount clot in the cul-de-sac. The ectopic pregnancy checkup 90% of the left tube and mass, decision made to proceed with left salpingectomy and atraumatic grasper was used to grasp the tube it the fimbriated M. In the mesosalpinx was coagulated and divided with the in seal device. Once this was completed the tube with the ectopic pregnancy was retrieved 2 slightly enlarging the lower incision, was grasped with a Kelly clamp and removed. Copious irrigation with a nasi was carried out, inspection at reduced pressure revealed excellent hemostasis. Once this was completed instruments were removed gas allowed escape because closed with 4 0 Vicryl subcuticular at the umbilicus. The lower incision, the fascia was closed separately with a 2-0 Vicryl suture 2-0 Vicryl interrupted sutures on the subcutaneous fat a 4-0 Monocryl subcuticular closure this was hemostatic she did receive Toradol at the end of the case and Ancef 1 g IV preop. She went to recovery in good condition. Dictated with [facility] Marthella Osorno M. Milana Obey.D.

## 2011-04-07 NOTE — Transfer of Care (Signed)
Immediate Anesthesia Transfer of Care Note  Patient: Martha Thompson  Procedure(s) Performed: Procedure(s) (LRB): LAPAROSCOPY OPERATIVE (N/A)  Patient Location: PACU  Anesthesia Type: General  Level of Consciousness: awake, alert  and oriented  Airway & Oxygen Therapy: Patient Spontanous Breathing and Patient connected to nasal cannula oxygen  Post-op Assessment: Report given to PACU RN and Post -op Vital signs reviewed and stable  Post vital signs: Reviewed and stable  Complications: No apparent anesthesia complications

## 2011-04-07 NOTE — Anesthesia Preprocedure Evaluation (Addendum)
Anesthesia Evaluation  Patient identified by MRN, date of birth, ID band Patient awake    Reviewed: Allergy & Precautions, H&P , NPO status , Patient's Chart, lab work & pertinent test results  Airway Mallampati: II TM Distance: >3 FB Neck ROM: Full    Dental No notable dental hx. (+) Teeth Intact   Pulmonary asthma , Current Smoker,  breath sounds clear to auscultation  Pulmonary exam normal       Cardiovascular hypertension, negative cardio ROS  Rhythm:Regular Rate:Normal     Neuro/Psych  Headaches, Anxiety Depression  Neuromuscular disease    GI/Hepatic negative GI ROS, Neg liver ROS,   Endo/Other  negative endocrine ROS  Renal/GU negative Renal ROS  negative genitourinary   Musculoskeletal negative musculoskeletal ROS (+)   Abdominal (+) + obese,  Abdomen: soft and tender.    Peds  Hematology negative hematology ROS (+)   Anesthesia Other Findings   Reproductive/Obstetrics (+) Pregnancy                          Anesthesia Physical Anesthesia Plan  ASA: II and Emergent  Anesthesia Plan: General   Post-op Pain Management:    Induction: Intravenous, Rapid sequence and Cricoid pressure planned  Airway Management Planned: Oral ETT  Additional Equipment:   Intra-op Plan:   Post-operative Plan:   Informed Consent: I have reviewed the patients History and Physical, chart, labs and discussed the procedure including the risks, benefits and alternatives for the proposed anesthesia with the patient or authorized representative who has indicated his/her understanding and acceptance.   Dental advisory given  Plan Discussed with: CRNA, Anesthesiologist and Surgeon  Anesthesia Plan Comments:         Anesthesia Quick Evaluation

## 2011-04-07 NOTE — MAU Note (Addendum)
Dr foster in/ @nd  IV site (R) not running , attempt to save line with pump, Dr. Malen Gauze OK to remove

## 2011-04-07 NOTE — Anesthesia Postprocedure Evaluation (Signed)
  Anesthesia Post-op Note  Patient: Martha Thompson  Procedure(s) Performed: Procedure(s) (LRB): LAPAROSCOPY OPERATIVE (N/A)  Patient Location: PACU  Anesthesia Type: General  Level of Consciousness: awake, alert  and oriented  Airway and Oxygen Therapy: Patient Spontanous Breathing  Post-op Pain: none  Post-op Assessment: Post-op Vital signs reviewed, Patient's Cardiovascular Status Stable, Respiratory Function Stable, Patent Airway, No signs of Nausea or vomiting and Pain level controlled  Post-op Vital Signs: Reviewed and stable  Complications: No apparent anesthesia complications

## 2011-04-07 NOTE — MAU Note (Signed)
Received from [facility]

## 2011-04-08 LAB — CBC
Hemoglobin: 12.7 g/dL (ref 12.0–15.0)
MCV: 93.6 fL (ref 78.0–100.0)
Platelets: 242 10*3/uL (ref 150–400)
RBC: 4.06 MIL/uL (ref 3.87–5.11)
WBC: 15 10*3/uL — ABNORMAL HIGH (ref 4.0–10.5)

## 2011-04-08 LAB — GC/CHLAMYDIA PROBE AMP, GENITAL: GC Probe Amp, Genital: NEGATIVE

## 2011-04-08 MED ORDER — OXYCODONE-ACETAMINOPHEN 5-325 MG PO TABS: 1.0000 | ORAL_TABLET | Freq: Four times a day (QID) | ORAL | Status: DC | PRN

## 2011-04-08 NOTE — Discharge Summary (Signed)
Physician Discharge Summary  Patient ID: Martha Thompson MRN: [id number] DOB/AGE: 08-20-1985 26 y.o.  Admit date: 04/07/2011 Discharge date: 04/08/2011  Admission Diagnoses:  Discharge Diagnoses:  Active Problems:  * No active hospital problems. *    Discharged Condition: good  Hospital Course: DL with L salpingectomy for partially ruptured ectopic pregnancy, obsv overnight, stable day of DC, afeb with incs C/D  Consults: None  Significant Diagnostic Studies: [place]  Results for orders placed during the hospital encounter of 04/07/11 (from the past 24 hour(s))  URINALYSIS, ROUTINE W REFLEX MICROSCOPIC     Status: Abnormal   Collection Time   04/07/11  9:46 AM      Component Value Range   Color, Urine YELLOW  YELLOW    APPearance CLOUDY (*) CLEAR    Specific Gravity, Urine 1.017  1.005 - 1.030    pH 7.0  5.0 - 8.0    Glucose, UA NEGATIVE  NEGATIVE (mg/dL)   Hgb urine dipstick LARGE (*) NEGATIVE    Bilirubin Urine NEGATIVE  NEGATIVE    Ketones, ur NEGATIVE  NEGATIVE (mg/dL)   Protein, ur NEGATIVE  NEGATIVE (mg/dL)   Urobilinogen, UA 1.0  0.0 - 1.0 (mg/dL)   Nitrite NEGATIVE  NEGATIVE    Leukocytes, UA NEGATIVE  NEGATIVE   PREGNANCY, URINE     Status: Abnormal   Collection Time   04/07/11  9:46 AM      Component Value Range   Preg Test, Ur POSITIVE (*) NEGATIVE   URINE MICROSCOPIC-ADD ON     Status: Abnormal   Collection Time   04/07/11  9:46 AM      Component Value Range   Squamous Epithelial / LPF MANY (*) RARE    RBC / HPF 21-50  <3 (RBC/hpf)   Bacteria, UA RARE  RARE   GC/CHLAMYDIA PROBE AMP, GENITAL     Status: Normal   Collection Time   04/07/11 10:05 AM      Component Value Range   GC Probe Amp, Genital NEGATIVE  NEGATIVE    Chlamydia, DNA Probe NEGATIVE  NEGATIVE   WET PREP, GENITAL     Status: Abnormal   Collection Time   04/07/11 10:05 AM      Component Value Range   Yeast Wet Prep HPF POC NONE SEEN  NONE SEEN    Trich, Wet Prep NONE SEEN  NONE SEEN    Clue  Cells Wet Prep HPF POC NONE SEEN  NONE SEEN    WBC, Wet Prep HPF POC RARE (*) NONE SEEN   ABO/RH     Status: Normal   Collection Time   04/07/11 10:09 AM      Component Value Range   ABO/RH(D) O POS     No rh immune globuloin NOT A RH IMMUNE GLOBULIN CANDIDATE, PT RH POSITIVE    HCG, QUANTITATIVE, PREGNANCY     Status: Abnormal   Collection Time   04/07/11 10:15 AM      Component Value Range   hCG, Beta Chain, Quant, S 2757 (*) <5 (mIU/mL)  CBC     Status: Abnormal   Collection Time   04/08/11  5:35 AM      Component Value Range   WBC 15.0 (*) 4.0 - 10.5 (K/uL)   RBC 4.06  3.87 - 5.11 (MIL/uL)   Hemoglobin 12.7  12.0 - 15.0 (g/dL)   HCT 40.9  81.1 - 91.4 (%)   MCV 93.6  78.0 - 100.0 (fL)   MCH 31.3  26.0 - 34.0 (pg)   MCHC 33.4  30.0 - 36.0 (g/dL)   RDW 96.0  45.4 - 09.8 (%)   Platelets 242  150 - 400 (K/uL)    Treatments: surgery: l salpingectomy  Discharge Exam: Blood pressure 110/63, pulse 62, temperature 98.2 F (36.8 C), temperature source Oral, resp. rate 18, height 5\' 11"  (1.803 m), weight 250 lb (113.399 kg), last menstrual period 03/10/2010, SpO2 97.00%, unknown if currently breastfeeding. abd + BS, incs C/D  Disposition:   Discharge Orders    Future Appointments: Provider: Department: Dept Phone: Center:   04/21/2011 9:00 AM Corwin Levins, MD [facility] [phone] [facility]     Medication List  As of 04/08/2011  9:37 AM   STOP taking these medications         EPIPEN 2-PAK 0.3 mg/0.3 mL Devi      HYDROcodone-acetaminophen 5-500 MG per tablet         TAKE these medications         albuterol 108 (90 BASE) MCG/ACT inhaler   Commonly known as: PROVENTIL HFA;VENTOLIN HFA   Inhale 2 puffs into the lungs every 6 (six) hours as needed.      ALPRAZolam 1 MG tablet   Commonly known as: XANAX   Take 1 tablet (1 mg total) by mouth 3 (three) times daily as needed. For nerves      cetirizine 10 MG tablet   Commonly known as: ZYRTEC   Take 10 mg by mouth daily.       ibuprofen 800 MG tablet   Commonly known as: ADVIL,MOTRIN   Take 800 mg by mouth every 8 (eight) hours as needed. For  Pain        oxyCODONE-acetaminophen 5-325 MG per tablet   Commonly known as: PERCOCET   Take 1-2 tablets by mouth every 6 (six) hours as needed for pain (moderate to severe pain (when tolerating fluids)).           Follow-up Information    Follow up with Meriel Pica, MD in 10 days. (office will call)    Contact information:   [address] [place] [address] [phone]          Signed: Meriel Pica 04/08/2011, 9:37 AM

## 2011-04-08 NOTE — Discharge Instructions (Addendum)
Call if persistent N+V, T>101, ^ pain or heavy vag bleeding No sex. Exercise X 2 weeks

## 2011-04-08 NOTE — Progress Notes (Signed)
1 Day Post-Op Procedure(s) (LRB): LAPAROSCOPY OPERATIVE (N/A)  Subjective: Patient reports tolerating PO.    Objective: I have reviewed patient's vital signs, intake and output, medications and labs.  Abd + BS, incs C/D, nontender  Assessment: s/p Procedure(s) (LRB): LAPAROSCOPY OPERATIVE (N/A): stable  Plan: Discharge home  LOS: 1 day    [facility] M 04/08/2011, 9:32 AM

## 2011-04-08 NOTE — Anesthesia Postprocedure Evaluation (Signed)
Anesthesia Post Note  Patient: Martha Thompson  Procedure(s) Performed: Procedure(s) (LRB): LAPAROSCOPY OPERATIVE (N/A)  Anesthesia type: General  Patient location: Mother/Baby  Post pain: Pain level controlled  Post assessment: Post-op Vital signs reviewed  Last Vitals:  Filed Vitals:   04/08/11 0528  BP: 110/63  Pulse: 62  Temp: 36.8 C  Resp: 18    Post vital signs: Reviewed  Level of consciousness: awake and alert   Complications: No apparent anesthesia complications

## 2011-04-08 NOTE — ED Provider Notes (Signed)
Medical screening examination/treatment/procedure(s) were conducted as a shared visit with non-physician practitioner(s) and myself.  I personally evaluated the patient during the encounter  abd pain, found to have +preg and ectopic here. Patient with +diffuse lower ttp. Uncontrolled in ED.  Plan for transfer to [facility] for definitive OBGYN management.  Forbes Cellar, MD 04/08/11 [phone]

## 2011-04-08 NOTE — Progress Notes (Signed)
Pt d/c home with boyfriend, ambulatory to private car. D/C instructions and prescriptions given to pt. Pt verbalized understanding.

## 2011-04-08 NOTE — Addendum Note (Signed)
Addendum  created 04/08/11 0730 by Jhonnie Garner, CRNA   Modules edited:Notes Section

## 2011-04-10 ENCOUNTER — Encounter (HOSPITAL_COMMUNITY): Payer: Self-pay | Admitting: Obstetrics and Gynecology

## 2011-04-17 ENCOUNTER — Ambulatory Visit (INDEPENDENT_AMBULATORY_CARE_PROVIDER_SITE_OTHER): Payer: PRIVATE HEALTH INSURANCE | Admitting: Internal Medicine

## 2011-04-17 ENCOUNTER — Encounter: Payer: Self-pay | Admitting: Internal Medicine

## 2011-04-17 VITALS — BP 120/78 | HR 103 | Temp 98.0°F | Ht 71.0 in | Wt 252.1 lb

## 2011-04-17 DIAGNOSIS — M545 Low back pain, unspecified: Secondary | ICD-10-CM

## 2011-04-17 DIAGNOSIS — G8929 Other chronic pain: Secondary | ICD-10-CM

## 2011-04-17 DIAGNOSIS — F411 Generalized anxiety disorder: Secondary | ICD-10-CM

## 2011-04-17 DIAGNOSIS — R062 Wheezing: Secondary | ICD-10-CM

## 2011-04-17 DIAGNOSIS — J209 Acute bronchitis, unspecified: Secondary | ICD-10-CM

## 2011-04-17 DIAGNOSIS — Z0000 Encounter for general adult medical examination without abnormal findings: Secondary | ICD-10-CM

## 2011-04-17 MED ORDER — HYDROCODONE-ACETAMINOPHEN 5-500 MG PO TABS: 1.0000 | ORAL_TABLET | Freq: Two times a day (BID) | ORAL | Status: DC | PRN

## 2011-04-17 MED ORDER — METHYLPREDNISOLONE 4 MG PO KIT: PACK | ORAL | Status: AC

## 2011-04-17 MED ORDER — LEVOFLOXACIN 250 MG PO TABS: 250.0000 mg | ORAL_TABLET | Freq: Every day | ORAL | Status: AC

## 2011-04-17 MED ORDER — ALPRAZOLAM 1 MG PO TABS: 1.0000 mg | ORAL_TABLET | Freq: Three times a day (TID) | ORAL | Status: DC | PRN

## 2011-04-17 MED ORDER — AZITHROMYCIN 250 MG PO TABS: ORAL_TABLET | ORAL | Status: DC

## 2011-04-17 NOTE — Assessment & Plan Note (Signed)
With recent acute pain on percocet now resolved, Continue all other medications as before - refill hydrocodone prn

## 2011-04-17 NOTE — Assessment & Plan Note (Signed)
Mild to mod, for medrol pack,  to f/u any worsening symptoms or concerns

## 2011-04-17 NOTE — Assessment & Plan Note (Signed)
Mild to mod, for antibx course,  to f/u any worsening symptoms or concerns 

## 2011-04-17 NOTE — Assessment & Plan Note (Addendum)
Overall doing well, age appropriate education and counseling updated, referrals for preventative services and immunizations addressed, dietary and smoking counseling addressed, most recent labs and ECG reviewed.  I have personally reviewed and have noted: 1) the patient's medical and social history 2) The pt's use of alcohol, tobacco, and illicit drugs 3) The patient's current medications and supplements 4) Functional ability including ADL's, fall risk, home safety risk, hearing and visual impairment 5) Diet and physical activities 6) Evidence for depression or mood disorder 7) The patient's height, weight, and BMI have been recorded in the chart I have made referrals, and provided counseling and education based on review of the above COpy of mar 29 labs d/w pt and given

## 2011-04-17 NOTE — Progress Notes (Signed)
Subjective:    Patient ID: Martha Thompson, female    DOB: 03-05-1985, 26 y.o.   MRN: [id number]  HPI Here for wellness and f/u;  Overall doing ok;  Pt denies CP, worsening SOB, DOE,  orthopnea, PND, worsening LE edema, palpitations, dizziness or syncope.  Pt denies neurological change such as new Headache, facial or extremity weakness.  Pt denies polydipsia, polyuria, or low sugar symptoms. Pt states overall good compliance with treatment and medications, good tolerability, and trying to follow lower cholesterol diet.  Pt denies worsening depressive symptoms, suicidal ideation or panic. No fever, wt loss, night sweats, loss of appetite, or other constitutional symptoms.  Pt states good ability with ADL's, low fall risk, home safety reviewed and adequate, no significant changes in hearing or vision, and occasionally active with exercise.  Did have recent mar 29 hospn for left tubal pregnancy, with #30 percocet now finished, pelvic pain much improved overall but still more painful than her chronic back pain.  We discussed SSRI trial again, again reminds me she did not do well with prozac, declines.  Here with acute onset mild to mod 2-3 days ST, HA, general weakness and malaise, with prod cough greenish sputum, and onset mild wheezing this am, better with inhaler Past Medical History  Diagnosis Date  . HYPERLIPIDEMIA 06/14/2007  . Morbid obesity 09/04/2006  . ANXIETY 09/04/2006  . DEPRESSION 09/04/2006  . COMMON MIGRAINE 06/14/2007  . BLEPHARITIS, LEFT 05/21/2008  . OTITIS MEDIA, ACUTE, LEFT 03/27/2008  . SINUSITIS- ACUTE-NOS 10/11/2007  . URI 10/29/2009  . ALLERGIC RHINITIS 10/30/2009  . ASTHMA 12/25/2006  . ASTHMA, WITH ACUTE EXACERBATION 05/03/2009  . URTICARIA 05/03/2009  . DISC DISEASE, CERVICAL 10/29/2009  . LOW BACK PAIN 09/04/2006  . TENOSYNOVITIS, WRIST 10/11/2007  . GANGLION CYST, WRIST, LEFT 10/11/2007  . Headache 10/23/2008   Past Surgical History  Procedure Date  . Back surgury 01/2003   s/p lumbar disc  . Laparoscopy 04/07/2011    Procedure: LAPAROSCOPY OPERATIVE;  Surgeon: Meriel Pica, MD;  Location: [facility];  Service: Gynecology;  Laterality: N/A;  left salpingogectomy    reports that she has been smoking.  She does not have any smokeless tobacco history on file. She reports that she drinks alcohol. She reports that she does not use illicit drugs. family history includes Anxiety disorder in her father and Diabetes in her father. No Known Allergies Current Outpatient Prescriptions on File Prior to Visit  Medication Sig Dispense Refill  . albuterol (PROVENTIL HFA;VENTOLIN HFA) 108 (90 BASE) MCG/ACT inhaler Inhale 2 puffs into the lungs every 6 (six) hours as needed.      . cetirizine (ZYRTEC) 10 MG tablet Take 10 mg by mouth daily.      Marland Kitchen ibuprofen (ADVIL,MOTRIN) 800 MG tablet Take 800 mg by mouth every 8 (eight) hours as needed. For  Pain        Review of Systems Review of Systems  Constitutional: Negative for diaphoresis and unexpected weight change.  HENT: Negative for drooling and tinnitus.   Eyes: Negative for photophobia and visual disturbance.  Respiratory: Negative for choking and stridor.   Gastrointestinal: Negative for vomiting and blood in stool.  Genitourinary: Negative for hematuria and decreased urine volume.  Musculoskeletal: Negative for gait problem.  Skin: Negative for color change and wound.  Neurological: Negative for tremors and numbness.  Psychiatric/Behavioral: Negative for decreased concentration. The patient is not hyperactive.       Objective:   Physical Exam BP 120/78  Pulse  103  Temp(Src) 98 F (36.7 C) (Oral)  Ht 5\' 11"  (1.803 m)  Wt 252 lb 1 oz (114.335 kg)  BMI 35.16 kg/m2  SpO2 99%  LMP 03/10/2010 Physical Exam  VS noted, mild ill Constitutional: Pt is oriented to person, place, and time. Appears well-developed and well-nourished.  HENT:  Head: Normocephalic and atraumatic.  Right Ear: External ear normal.  Left Ear:  External ear normal.  Nose: Nose normal.  Mouth/Throat: Oropharynx is clear and moist.  Bilat tm's mild erythema.  Sinus nontender.  Pharynx mild erythema Eyes: Conjunctivae and EOM are normal. Pupils are equal, round, and reactive to light.  Neck: Normal range of motion. Neck supple. No JVD present. No tracheal deviation present.  Cardiovascular: Normal rate, regular rhythm, normal heart sounds and intact distal pulses.   Pulmonary/Chest: Effort normal and breath sounds mild decr with mild wheeze bilat  Abdominal: Soft. Bowel sounds are normal. There is no tenderness.  Musculoskeletal: Normal range of motion. Exhibits no edema.  Lymphadenopathy:  Has no cervical adenopathy.  Neurological: Pt is alert and oriented to person, place, and time. Pt has normal reflexes. No cranial nerve deficit.  Skin: Skin is warm and dry. No rash noted.  Psychiatric:  Has  normal mood and affect. Behavior is normal. 1+nervous    Assessment & Plan:

## 2011-04-17 NOTE — Patient Instructions (Addendum)
Take all new medications as prescribed Continue all other medications as before You are given the hardcopy refills today as discussed Please have the pharmacy call with any refills you may need. Please return in 6 months, or sooner if needed

## 2011-04-21 ENCOUNTER — Ambulatory Visit: Payer: PRIVATE HEALTH INSURANCE | Admitting: Internal Medicine

## 2011-05-04 NOTE — ED Provider Notes (Signed)
Medical screening examination/treatment/procedure(s) were conducted as a shared visit with non-physician practitioner(s) and myself.  I personally evaluated the patient during the encounter  Delayed co-sign of addendum  Forbes Cellar, MD 05/04/11 [phone]

## 2011-06-14 ENCOUNTER — Telehealth: Payer: Self-pay

## 2011-06-14 NOTE — Telephone Encounter (Signed)
Pt called requesting authorization for early refill - Hydrocodone and Alprazolam. Rx's are due 06/07 but pt will be out of town, please advise.

## 2011-06-14 NOTE — Telephone Encounter (Signed)
Notified pharmacy spoke with sherry gave md response. She did want to inform md that this has been a ongoing thing with her and they try to hold med until there due date... 06/14/11@2 :01pm/LMB

## 2011-06-14 NOTE — Telephone Encounter (Signed)
Ok this time only for both

## 2011-10-24 ENCOUNTER — Other Ambulatory Visit: Payer: Self-pay

## 2011-10-24 DIAGNOSIS — M545 Low back pain: Secondary | ICD-10-CM

## 2011-10-24 DIAGNOSIS — F411 Generalized anxiety disorder: Secondary | ICD-10-CM

## 2011-10-24 DIAGNOSIS — G8929 Other chronic pain: Secondary | ICD-10-CM

## 2011-10-24 MED ORDER — ALPRAZOLAM 1 MG PO TABS: 1.0000 mg | ORAL_TABLET | Freq: Three times a day (TID) | ORAL | Status: DC | PRN

## 2011-10-24 MED ORDER — HYDROCODONE-ACETAMINOPHEN 5-500 MG PO TABS: 1.0000 | ORAL_TABLET | Freq: Two times a day (BID) | ORAL | Status: DC | PRN

## 2011-10-24 NOTE — Telephone Encounter (Signed)
Done hardcopy to robin  

## 2011-10-24 NOTE — Telephone Encounter (Signed)
Faxed hardcopies to [facility].

## 2011-10-30 ENCOUNTER — Other Ambulatory Visit: Payer: Self-pay

## 2011-10-30 MED ORDER — LEVOCETIRIZINE DIHYDROCHLORIDE 5 MG PO TABS: 5.0000 mg | ORAL_TABLET | Freq: Every day | ORAL | Status: DC

## 2011-10-31 ENCOUNTER — Encounter: Payer: Self-pay | Admitting: Internal Medicine

## 2011-10-31 ENCOUNTER — Ambulatory Visit (INDEPENDENT_AMBULATORY_CARE_PROVIDER_SITE_OTHER): Payer: PRIVATE HEALTH INSURANCE | Admitting: Internal Medicine

## 2011-10-31 VITALS — BP 112/74 | HR 88 | Temp 98.5°F | Ht 71.0 in | Wt 258.5 lb

## 2011-10-31 DIAGNOSIS — I10 Essential (primary) hypertension: Secondary | ICD-10-CM

## 2011-10-31 DIAGNOSIS — J019 Acute sinusitis, unspecified: Secondary | ICD-10-CM | POA: Insufficient documentation

## 2011-10-31 DIAGNOSIS — F411 Generalized anxiety disorder: Secondary | ICD-10-CM

## 2011-10-31 DIAGNOSIS — G8929 Other chronic pain: Secondary | ICD-10-CM

## 2011-10-31 MED ORDER — FLUCONAZOLE 150 MG PO TABS: ORAL_TABLET | ORAL | Status: DC

## 2011-10-31 MED ORDER — LEVOFLOXACIN 500 MG PO TABS: 500.0000 mg | ORAL_TABLET | Freq: Every day | ORAL | Status: DC

## 2011-10-31 MED ORDER — IBUPROFEN 800 MG PO TABS: 800.0000 mg | ORAL_TABLET | Freq: Three times a day (TID) | ORAL | Status: DC | PRN

## 2011-10-31 MED ORDER — ALBUTEROL SULFATE HFA 108 (90 BASE) MCG/ACT IN AERS: 2.0000 | INHALATION_SPRAY | Freq: Four times a day (QID) | RESPIRATORY_TRACT | Status: DC | PRN

## 2011-10-31 MED ORDER — HYDROCOD POLST-CHLORPHEN POLST 10-8 MG/5ML PO LQCR: 5.0000 mL | Freq: Two times a day (BID) | ORAL | Status: DC | PRN

## 2011-10-31 MED ORDER — LEVOCETIRIZINE DIHYDROCHLORIDE 5 MG PO TABS: 5.0000 mg | ORAL_TABLET | Freq: Every day | ORAL | Status: DC

## 2011-10-31 NOTE — Patient Instructions (Addendum)
Take all new medications as prescribed Continue all other medications as before Your vicodin/xanax was already faxed to your pharmacy on oct 15

## 2011-11-05 ENCOUNTER — Encounter: Payer: Self-pay | Admitting: Internal Medicine

## 2011-11-05 NOTE — Assessment & Plan Note (Signed)
stable overall by hx and exam, and pt to continue medical treatment as before 

## 2011-11-05 NOTE — Progress Notes (Signed)
Subjective:    Patient ID: Martha Thompson, female    DOB: Jun 08, 1985, 26 y.o.   MRN: [id number]  HPI   Here with 3 days acute onset fever, facial pain, pressure, general weakness and malaise, and greenish d/c, with slight ST, but little to no cough and Pt denies chest pain, increased sob or doe, wheezing, orthopnea, PND, increased LE swelling, palpitations, dizziness or syncope.  Pt continues to have recurring LBP without change in severity, bowel or bladder change, fever, wt loss,  worsening LE pain/numbness/weakness, gait change or falls.  Pt denies new neurological symptoms such as new headache, or facial or extremity weakness or numbness   Pt denies polydipsia, polyuria.   Pt denies fever, wt loss, night sweats, loss of appetite, or other constitutional symptoms except for the above.  For flu shot today Past Medical History  Diagnosis Date  . HYPERLIPIDEMIA 06/14/2007  . Morbid obesity 09/04/2006  . ANXIETY 09/04/2006  . DEPRESSION 09/04/2006  . COMMON MIGRAINE 06/14/2007  . BLEPHARITIS, LEFT 05/21/2008  . OTITIS MEDIA, ACUTE, LEFT 03/27/2008  . SINUSITIS- ACUTE-NOS 10/11/2007  . URI 10/29/2009  . ALLERGIC RHINITIS 10/30/2009  . ASTHMA 12/25/2006  . ASTHMA, WITH ACUTE EXACERBATION 05/03/2009  . URTICARIA 05/03/2009  . DISC DISEASE, CERVICAL 10/29/2009  . LOW BACK PAIN 09/04/2006  . TENOSYNOVITIS, WRIST 10/11/2007  . GANGLION CYST, WRIST, LEFT 10/11/2007  . Headache 10/23/2008   Past Surgical History  Procedure Date  . Back surgury 01/2003    s/p lumbar disc  . Laparoscopy 04/07/2011    Procedure: LAPAROSCOPY OPERATIVE;  Surgeon: Meriel Pica, MD;  Location: [facility];  Service: Gynecology;  Laterality: N/A;  left salpingogectomy    reports that she has been smoking.  She does not have any smokeless tobacco history on file. She reports that she drinks alcohol. She reports that she does not use illicit drugs. family history includes Anxiety disorder in her father and Diabetes in her  father. No Known Allergies Current Outpatient Prescriptions on File Prior to Visit  Medication Sig Dispense Refill  . albuterol (PROVENTIL HFA;VENTOLIN HFA) 108 (90 BASE) MCG/ACT inhaler Inhale 2 puffs into the lungs every 6 (six) hours as needed.  3 Inhaler  3  . ALPRAZolam (XANAX) 1 MG tablet Take 1 tablet (1 mg total) by mouth 3 (three) times daily as needed. For nerves  90 tablet  5  . HYDROcodone-acetaminophen (VICODIN) 5-500 MG per tablet Take 1 tablet by mouth 2 (two) times daily as needed for pain. 1 by mouth two times a day as needed for pain  60 tablet  5   Review of Systems  Constitutional: Negative for diaphoresis and unexpected weight change.  HENT: Negative for tinnitus.   Eyes: Negative for photophobia and visual disturbance.  Respiratory: Negative for choking and stridor.   Gastrointestinal: Negative for vomiting and blood in stool.  Genitourinary: Negative for hematuria and decreased urine volume.  Musculoskeletal: Negative for gait problem.  Skin: Negative for color change and wound.  Neurological: Negative for tremors and numbness.  Psychiatric/Behavioral: Negative for decreased concentration. The patient is not hyperactive.       Objective:   Physical Exam BP 112/74  Pulse 88  Temp 98.5 F (36.9 C) (Oral)  Ht 5\' 11"  (1.803 m)  Wt 258 lb 8 oz (117.255 kg)  BMI 36.05 kg/m2  SpO2 97%  LMP 03/10/2010 Physical Exam  VS noted, mild ill Constitutional: Pt appears well-developed and well-nourished.  HENT: Head: Normocephalic.  Right Ear: External  ear normal.  Left Ear: External ear normal.  Bilat tm's mild erythema.  Sinus tender.  Pharynx mild erythema Eyes: Conjunctivae and EOM are normal. Pupils are equal, round, and reactive to light.  Neck: Normal range of motion. Neck supple.  Cardiovascular: Normal rate and regular rhythm.   Pulmonary/Chest: Effort normal and breath sounds normal.  Neurological: Pt is alert. Not confused  Skin: Skin is warm. No erythema.   Psychiatric: Pt behavior is normal. Thought content normal.     Assessment & Plan:

## 2011-11-05 NOTE — Assessment & Plan Note (Signed)
stable overall by hx and exam, most recent data reviewed with pt, and pt to continue medical treatment as before BP Readings from Last 3 Encounters:  10/31/11 112/74  04/17/11 120/78  04/08/11 138/90

## 2011-11-05 NOTE — Assessment & Plan Note (Signed)
stable overall by hx and exam, most recent data reviewed with pt, and pt to continue medical treatment as before Lab Results  Component Value Date   WBC 15.0* 04/08/2011   HGB 12.7 04/08/2011   HCT 38.0 04/08/2011   PLT 242 04/08/2011   GLUCOSE 107* 04/07/2011   CHOL 177 10/23/2008   TRIG 99.0 10/23/2008   HDL 34.50* 10/23/2008   LDLCALC 123* 10/23/2008   ALT 12 04/07/2011   AST 13 04/07/2011   NA 138 04/07/2011   K 3.7 04/07/2011   CL 105 04/07/2011   CREATININE 0.56 04/07/2011   BUN 16 04/07/2011   CO2 22 04/07/2011   TSH 1.04 10/23/2008

## 2011-11-05 NOTE — Assessment & Plan Note (Signed)
Mild to mod, for antibx course,  to f/u any worsening symptoms or concerns 

## 2012-01-08 ENCOUNTER — Telehealth: Payer: Self-pay | Admitting: Internal Medicine

## 2012-01-08 NOTE — Telephone Encounter (Signed)
Per the patient, at her last visit Dr. Jonny Ruiz agreed to accept her boyfriend as a new patient, please advise

## 2012-01-10 NOTE — Telephone Encounter (Signed)
Ok with me 

## 2012-01-24 ENCOUNTER — Telehealth: Payer: Self-pay

## 2012-01-24 MED ORDER — HYDROCODONE-ACETAMINOPHEN 5-325 MG PO TABS: 1.0000 | ORAL_TABLET | Freq: Four times a day (QID) | ORAL | Status: DC | PRN

## 2012-01-24 NOTE — Telephone Encounter (Signed)
Done hardcopy to robin - ok to change

## 2012-01-24 NOTE — Telephone Encounter (Signed)
Fax from pharmacy to inform Hydrocodone 5/500 no longer available.  Can they substitute 5/325?

## 2012-01-25 NOTE — Telephone Encounter (Signed)
Faxed hardcopy to pharmacy. 

## 2012-04-24 ENCOUNTER — Ambulatory Visit (INDEPENDENT_AMBULATORY_CARE_PROVIDER_SITE_OTHER): Payer: PRIVATE HEALTH INSURANCE | Admitting: Internal Medicine

## 2012-04-24 ENCOUNTER — Encounter: Payer: Self-pay | Admitting: Internal Medicine

## 2012-04-24 ENCOUNTER — Telehealth: Payer: Self-pay | Admitting: Internal Medicine

## 2012-04-24 VITALS — BP 104/78 | HR 101 | Temp 97.0°F | Ht 71.0 in | Wt 285.4 lb

## 2012-04-24 DIAGNOSIS — K219 Gastro-esophageal reflux disease without esophagitis: Secondary | ICD-10-CM

## 2012-04-24 DIAGNOSIS — G43009 Migraine without aura, not intractable, without status migrainosus: Secondary | ICD-10-CM

## 2012-04-24 DIAGNOSIS — J019 Acute sinusitis, unspecified: Secondary | ICD-10-CM

## 2012-04-24 DIAGNOSIS — J309 Allergic rhinitis, unspecified: Secondary | ICD-10-CM

## 2012-04-24 DIAGNOSIS — F411 Generalized anxiety disorder: Secondary | ICD-10-CM

## 2012-04-24 HISTORY — DX: Gastro-esophageal reflux disease without esophagitis: K21.9

## 2012-04-24 MED ORDER — CETIRIZINE HCL 10 MG PO TABS: 10.0000 mg | ORAL_TABLET | Freq: Every day | ORAL | Status: DC

## 2012-04-24 MED ORDER — FLUTICASONE PROPIONATE 50 MCG/ACT NA SUSP: 2.0000 | Freq: Every day | NASAL | Status: DC

## 2012-04-24 MED ORDER — AZITHROMYCIN 250 MG PO TABS: ORAL_TABLET | ORAL | Status: DC

## 2012-04-24 MED ORDER — CLARITHROMYCIN 500 MG PO TABS: 500.0000 mg | ORAL_TABLET | Freq: Two times a day (BID) | ORAL | Status: DC

## 2012-04-24 MED ORDER — PANTOPRAZOLE SODIUM 40 MG PO TBEC: 40.0000 mg | DELAYED_RELEASE_TABLET | Freq: Every day | ORAL | Status: DC

## 2012-04-24 MED ORDER — ALPRAZOLAM 1 MG PO TABS: 1.0000 mg | ORAL_TABLET | Freq: Three times a day (TID) | ORAL | Status: DC | PRN

## 2012-04-24 MED ORDER — SUMATRIPTAN SUCCINATE 100 MG PO TABS: 100.0000 mg | ORAL_TABLET | ORAL | Status: DC | PRN

## 2012-04-24 MED ORDER — HYDROCODONE-ACETAMINOPHEN 5-325 MG PO TABS: 1.0000 | ORAL_TABLET | Freq: Four times a day (QID) | ORAL | Status: DC | PRN

## 2012-04-24 NOTE — Progress Notes (Signed)
Subjective:    Patient ID: Martha Thompson, female    DOB: Feb 14, 1985, 27 y.o.   MRN: [id number]  HPI  Here to f/u, Does have several wks ongoing nasal allergy symptoms with clearish congestion, itch and sneezing, without fever, pain, ST, cough, swelling or wheezing. Does have new worsening reflux symptoms, but no other abd pain, dysphagia, n/v, bowel change or blood or wt loss. In fact, Gained 20 lbs over the winter.   Here with 2-3 days acute onset fever, facial pain, pressure, headache, general weakness and malaise, and greenish d/c, with mild ST and cough, but pt denies chest pain, wheezing, increased sob or doe, orthopnea, PND, increased LE swelling, palpitations, dizziness or syncope.  Also with recent migraine no change in freq and severity but just not improving with ibuprofen as usual.   Not pregnant  Denies worsening depressive symptoms, suicidal ideation, or panic; has ongoing anxiety Past Medical History  Diagnosis Date  . HYPERLIPIDEMIA 06/14/2007  . Morbid obesity 09/04/2006  . ANXIETY 09/04/2006  . DEPRESSION 09/04/2006  . COMMON MIGRAINE 06/14/2007  . BLEPHARITIS, LEFT 05/21/2008  . OTITIS MEDIA, ACUTE, LEFT 03/27/2008  . SINUSITIS- ACUTE-NOS 10/11/2007  . URI 10/29/2009  . ALLERGIC RHINITIS 10/30/2009  . ASTHMA 12/25/2006  . ASTHMA, WITH ACUTE EXACERBATION 05/03/2009  . URTICARIA 05/03/2009  . DISC DISEASE, CERVICAL 10/29/2009  . LOW BACK PAIN 09/04/2006  . TENOSYNOVITIS, WRIST 10/11/2007  . GANGLION CYST, WRIST, LEFT 10/11/2007  . Headache 10/23/2008  . GERD (gastroesophageal reflux disease) 04/24/2012   Past Surgical History  Procedure Laterality Date  . Back surgury  01/2003    s/p lumbar disc  . Laparoscopy  04/07/2011    Procedure: LAPAROSCOPY OPERATIVE;  Surgeon: Meriel Pica, MD;  Location: [facility];  Service: Gynecology;  Laterality: N/A;  left salpingogectomy    reports that she has been smoking.  She does not have any smokeless tobacco history on file. She reports that   drinks alcohol. She reports that she does not use illicit drugs. family history includes Anxiety disorder in her father and Diabetes in her father. No Known Allergies Current Outpatient Prescriptions on File Prior to Visit  Medication Sig Dispense Refill  . albuterol (PROVENTIL HFA;VENTOLIN HFA) 108 (90 BASE) MCG/ACT inhaler Inhale 2 puffs into the lungs every 6 (six) hours as needed.  3 Inhaler  3  . chlorpheniramine-HYDROcodone (TUSSIONEX PENNKINETIC ER) 10-8 MG/5ML LQCR Take 5 mLs by mouth every 12 (twelve) hours as needed.  140 mL  1  . fluconazole (DIFLUCAN) 150 MG tablet 1 tab by mouth every 3 days as needed  2 tablet  1  . ibuprofen (ADVIL,MOTRIN) 800 MG tablet Take 1 tablet (800 mg total) by mouth every 8 (eight) hours as needed. For  Pain  270 tablet  3   No current facility-administered medications on file prior to visit.   Review of Systems  Constitutional: Negative for unexpected weight change, or unusual diaphoresis  HENT: Negative for tinnitus.   Eyes: Negative for photophobia and visual disturbance.  Respiratory: Negative for choking and stridor.   Gastrointestinal: Negative for vomiting and blood in stool.  Genitourinary: Negative for hematuria and decreased urine volume.  Musculoskeletal: Negative for acute joint swelling Skin: Negative for color change and wound.  Neurological: Negative for tremors and numbness other than noted  Psychiatric/Behavioral: Negative for decreased concentration or  hyperactivity.       Objective:   Physical Exam BP 104/78  Pulse 101  Temp(Src) 97 F (36.1 C) (  Oral)  Ht 5\' 11"  (1.803 m)  Wt 285 lb 6 oz (129.445 kg)  BMI 39.82 kg/m2  SpO2 98% VS noted, mild ill Constitutional: Pt appears well-developed and well-nourished.  HENT: Head: NCAT.  Right Ear: External ear normal.  Left Ear: External ear normal.  Bilat tm's with mild erythema.  Max sinus areas mild tender.  Pharynx with mild erythema, no exudate Eyes: Conjunctivae and EOM  are normal. Pupils are equal, round, and reactive to light.  Neck: Normal range of motion. Neck supple.  Cardiovascular: Normal rate and regular rhythm.   Pulmonary/Chest: Effort normal and breath sounds normal.  Abd:  Soft, NT, non-distended, + BS Neurological: Pt is alert. Not confused  Skin: Skin is warm. No erythema.  Psychiatric: Pt behavior is normal. Thought content normal. 1+ nervous    Assessment & Plan:

## 2012-04-24 NOTE — Assessment & Plan Note (Signed)
For imitrex,ibuprofen asd,  to f/u any worsening symptoms or concerns

## 2012-04-24 NOTE — Assessment & Plan Note (Signed)
For change xyzal to zyrtec/fonase,  to f/u any worsening symptoms or concerns

## 2012-04-24 NOTE — Assessment & Plan Note (Signed)
Mild to mod, for antibx course,  to f/u any worsening symptoms or concerns 

## 2012-04-24 NOTE — Telephone Encounter (Signed)
Ok for biaxin instead - done erx

## 2012-04-24 NOTE — Patient Instructions (Signed)
Please take all new medication as prescribed - the protonix for reflux, zyrtec and flonase for allergies, imitrex for migraine, and zpack for sinus infection You can also use the advil cold and sinus if this helps as well, as needed OK to stop the xyzal Please continue all other medications as before, and refills have been done if requested - the alprazolam and pain medication Please return in 6 months, or sooner if needed

## 2012-04-24 NOTE — Assessment & Plan Note (Signed)
.  Mild to mod, for PPI trial, to f/u any worsening symptoms or concerns 

## 2012-04-24 NOTE — Assessment & Plan Note (Signed)
stable overall by history and exam, recent data reviewed with pt, and pt to continue medical treatment as before,  to f/u any worsening symptoms or concerns, for med refills  

## 2012-04-24 NOTE — Telephone Encounter (Signed)
Message copied by Corwin Levins on Wed Apr 24, 2012  5:34 PM ------      Message from: Scharlene Gloss B      Created: Wed Apr 24, 2012  4:44 PM       The patient stated zpack has never worked well for her.  She is requesting something different sent in. ------

## 2012-05-06 ENCOUNTER — Telehealth: Payer: Self-pay | Admitting: Internal Medicine

## 2012-05-06 MED ORDER — FLUCONAZOLE 150 MG PO TABS: ORAL_TABLET | ORAL | Status: DC

## 2012-05-06 NOTE — Telephone Encounter (Signed)
Patient Information:  Caller Name: Sula Rumple  Phone: [phone]  Patient: Martha Thompson, Martha Thompson  Gender: Female  DOB: 28-Dec-1985  Age: 27 Years  PCP: Oliver Barre (Adults only)  Pregnant: No  Office Follow Up:  Does the office need to follow up with this patient?: Yes  Instructions For The Office: Please call patient concerning her prescription requests.  RN Note:  Does not want to be seen in office since she was sen on 04/24/2012 for sinus infection.  Patient does request Levaquin for the lingering sinus infection sxs that she has, request prescription for possible thrush, and requests prescription for possible yeast infection.  Pharmacy requested:  [facility], [place], [phone].  Symptoms  Reason For Call & Symptoms: Onset 05/04/2012 red blisters on tongue and throat reddenned (a physician acquaintance advised her that she has thrush).  Also has possible yeast infection (primarily itching in the vaginal area).  Still has sxs of sinus infection (headache and productive cough with yellowish-green sputum).  If abx prescribed, patient requests Levaquin for sinus infection.  Also requests prescriptions for thrush and yeast infections.  Reviewed Health History In EMR: Yes  Reviewed Medications In EMR: Yes  Reviewed Allergies In EMR: Yes  Reviewed Surgeries / Procedures: Yes  Date of Onset of Symptoms: 05/04/2012 OB / GYN:  LMP: 04/22/2012  Guideline(s) Used:  Sore Throat  Disposition Per Guideline:   Strep Test Only Visit Today or Tomorrow  Reason For Disposition Reached:   Sore throat is the main symptom and persists > 48 hours  Advice Given:  For Relief of Sore Throat Pain:  Sip warm chicken broth or apple juice.  Suck on hard candy or a throat lozenge (over-the-counter).  Gargle warm salt water 3 times daily (1 teaspoon of salt in 8 oz or 240 ml of warm water).  Avoid cigarette smoke.  Soft Diet:   Cold drinks and milk shakes are especially good (Reason:  swollen tonsils can make some foods hard to swallow).  Liquids:  Adequate liquid intake is important to prevent dehydration. Drink 6-8 glasses of water per day.  Contagiousness:   You can return to work or school after the fever is gone and you feel well enough to participate in normal activities. If your doctor determines that you have Strep throat, then you will need to take an antibiotic for 24 hours before you can return.  Expected Course:  Sore throats with viral illnesses usually last 3 or 4 days.  Call Back If:  Sore throat is the main symptom and it lasts longer than 24 hours  Sore throat is mild but lasts longer than 4 days  Fever lasts longer than 3 days  You become worse.  Patient Refused Recommendation:  Patient Requests Prescription  Was seen in office on 04/24/2012 for sinus infection.

## 2012-05-06 NOTE — Telephone Encounter (Signed)
At this point should be ok for holding further antibx if no fever,  Also ok for diflucan, which should help with mouth and other yeast infection as well

## 2012-05-07 NOTE — Telephone Encounter (Signed)
Called the patient informed of MD instructions. 

## 2012-07-19 ENCOUNTER — Other Ambulatory Visit: Payer: Self-pay

## 2012-07-19 LAB — OB RESULTS CONSOLE HEPATITIS B SURFACE ANTIGEN: Hepatitis B Surface Ag: NEGATIVE

## 2012-07-19 LAB — OB RESULTS CONSOLE HIV ANTIBODY (ROUTINE TESTING): HIV: NONREACTIVE

## 2012-07-19 LAB — OB RESULTS CONSOLE RUBELLA ANTIBODY, IGM: RUBELLA: IMMUNE

## 2012-07-19 MED ORDER — HYDROCODONE-ACETAMINOPHEN 5-325 MG PO TABS: 1.0000 | ORAL_TABLET | Freq: Four times a day (QID) | ORAL | Status: DC | PRN

## 2012-07-19 NOTE — Telephone Encounter (Signed)
Faxed hardcopy to [facility] pharmacy

## 2012-07-19 NOTE — Telephone Encounter (Signed)
Done hardcopy to robin  

## 2012-11-30 LAB — OB RESULTS CONSOLE GBS: STREP GROUP B AG: POSITIVE

## 2012-12-17 ENCOUNTER — Other Ambulatory Visit: Payer: Self-pay | Admitting: Internal Medicine

## 2012-12-17 MED ORDER — HYDROXYZINE PAMOATE 25 MG PO CAPS: 25.0000 mg | ORAL_CAPSULE | Freq: Three times a day (TID) | ORAL | Status: DC | PRN

## 2012-12-22 LAB — OB RESULTS CONSOLE RPR: RPR: NONREACTIVE

## 2013-01-28 DIAGNOSIS — R6889 Other general symptoms and signs: Secondary | ICD-10-CM | POA: Insufficient documentation

## 2013-02-05 LAB — OB RESULTS CONSOLE GC/CHLAMYDIA
Chlamydia: NEGATIVE
GC PROBE AMP, GENITAL: NEGATIVE

## 2013-02-18 ENCOUNTER — Inpatient Hospital Stay (HOSPITAL_COMMUNITY): Payer: Medicaid Other | Admitting: Anesthesiology

## 2013-02-18 ENCOUNTER — Encounter (HOSPITAL_COMMUNITY): Payer: Self-pay | Admitting: General Practice

## 2013-02-18 ENCOUNTER — Inpatient Hospital Stay (HOSPITAL_COMMUNITY)
Admission: AD | Admit: 2013-02-18 | Discharge: 2013-02-21 | DRG: 766 | Disposition: A | Discharge status: Home / Self Care | Payer: Medicaid Other | Source: Ambulatory Visit | Attending: Obstetrics and Gynecology | Admitting: Obstetrics and Gynecology

## 2013-02-18 ENCOUNTER — Encounter (HOSPITAL_COMMUNITY): Payer: Medicaid Other | Admitting: Anesthesiology

## 2013-02-18 DIAGNOSIS — O4290 Premature rupture of membranes, unspecified as to length of time between rupture and onset of labor, unspecified weeks of gestation: Principal | ICD-10-CM | POA: Diagnosis present

## 2013-02-18 DIAGNOSIS — O9989 Other specified diseases and conditions complicating pregnancy, childbirth and the puerperium: Secondary | ICD-10-CM

## 2013-02-18 DIAGNOSIS — Z22330 Carrier of Group B streptococcus: Secondary | ICD-10-CM

## 2013-02-18 DIAGNOSIS — O99334 Smoking (tobacco) complicating childbirth: Secondary | ICD-10-CM | POA: Diagnosis present

## 2013-02-18 DIAGNOSIS — K219 Gastro-esophageal reflux disease without esophagitis: Secondary | ICD-10-CM | POA: Diagnosis present

## 2013-02-18 DIAGNOSIS — O99892 Other specified diseases and conditions complicating childbirth: Secondary | ICD-10-CM | POA: Diagnosis present

## 2013-02-18 DIAGNOSIS — E669 Obesity, unspecified: Secondary | ICD-10-CM | POA: Diagnosis present

## 2013-02-18 DIAGNOSIS — O9902 Anemia complicating childbirth: Secondary | ICD-10-CM | POA: Diagnosis present

## 2013-02-18 DIAGNOSIS — J45909 Unspecified asthma, uncomplicated: Secondary | ICD-10-CM | POA: Diagnosis present

## 2013-02-18 DIAGNOSIS — D649 Anemia, unspecified: Secondary | ICD-10-CM | POA: Diagnosis present

## 2013-02-18 DIAGNOSIS — F411 Generalized anxiety disorder: Secondary | ICD-10-CM | POA: Diagnosis present

## 2013-02-18 DIAGNOSIS — O99214 Obesity complicating childbirth: Secondary | ICD-10-CM

## 2013-02-18 DIAGNOSIS — Z98891 History of uterine scar from previous surgery: Secondary | ICD-10-CM

## 2013-02-18 DIAGNOSIS — O99344 Other mental disorders complicating childbirth: Secondary | ICD-10-CM | POA: Diagnosis present

## 2013-02-18 LAB — CBC
HEMATOCRIT: 38.9 % (ref 36.0–46.0)
Hemoglobin: 13.6 g/dL (ref 12.0–15.0)
MCH: 31.6 pg (ref 26.0–34.0)
MCHC: 35 g/dL (ref 30.0–36.0)
MCV: 90.5 fL (ref 78.0–100.0)
Platelets: 164 10*3/uL (ref 150–400)
RBC: 4.3 MIL/uL (ref 3.87–5.11)
RDW: 13.5 % (ref 11.5–15.5)
WBC: 16.6 10*3/uL — ABNORMAL HIGH (ref 4.0–10.5)

## 2013-02-18 LAB — TYPE AND SCREEN
ABO/RH(D): O POS
ANTIBODY SCREEN: NEGATIVE

## 2013-02-18 LAB — RPR: RPR Ser Ql: NONREACTIVE

## 2013-02-18 LAB — ABO/RH: ABO/RH(D): O POS

## 2013-02-18 MED ORDER — LACTATED RINGERS IV SOLN
500.0000 mL | Freq: Once | INTRAVENOUS | Status: AC
  Administered 2013-02-18: 500 mL via INTRAVENOUS

## 2013-02-18 MED ORDER — PHENYLEPHRINE 40 MCG/ML (10ML) SYRINGE FOR IV PUSH (FOR BLOOD PRESSURE SUPPORT)
80.0000 ug | PREFILLED_SYRINGE | INTRAVENOUS | Status: DC | PRN
  Filled 2013-02-18: qty 10

## 2013-02-18 MED ORDER — PENICILLIN G POTASSIUM 5000000 UNITS IJ SOLR
2.5000 10*6.[IU] | INTRAVENOUS | Status: DC
  Administered 2013-02-18 – 2013-02-19 (×5): 2.5 10*6.[IU] via INTRAVENOUS
  Filled 2013-02-18 (×7): qty 2.5

## 2013-02-18 MED ORDER — ONDANSETRON HCL 4 MG/2ML IJ SOLN: 4.0000 mg | Freq: Four times a day (QID) | INTRAMUSCULAR | Status: DC | PRN

## 2013-02-18 MED ORDER — FENTANYL 2.5 MCG/ML BUPIVACAINE 1/10 % EPIDURAL INFUSION (WH - ANES)
14.0000 mL/h | INTRAMUSCULAR | Status: DC | PRN
  Administered 2013-02-19: 14 mL/h via EPIDURAL
  Filled 2013-02-18 (×2): qty 125

## 2013-02-18 MED ORDER — OXYTOCIN 40 UNITS IN LACTATED RINGERS INFUSION - SIMPLE MED: 62.5000 mL/h | INTRAVENOUS | Status: DC

## 2013-02-18 MED ORDER — DEXTROSE 5 % IV SOLN
5.0000 10*6.[IU] | Freq: Once | INTRAVENOUS | Status: AC
  Administered 2013-02-18: 5 10*6.[IU] via INTRAVENOUS
  Filled 2013-02-18: qty 5

## 2013-02-18 MED ORDER — BUTORPHANOL TARTRATE 1 MG/ML IJ SOLN
2.0000 mg | INTRAMUSCULAR | Status: DC | PRN
  Administered 2013-02-18: 2 mg via INTRAVENOUS
  Filled 2013-02-18: qty 2

## 2013-02-18 MED ORDER — OXYTOCIN BOLUS FROM INFUSION: 500.0000 mL | INTRAVENOUS | Status: DC

## 2013-02-18 MED ORDER — ALBUTEROL SULFATE (2.5 MG/3ML) 0.083% IN NEBU: 3.0000 mL | INHALATION_SOLUTION | Freq: Four times a day (QID) | RESPIRATORY_TRACT | Status: DC | PRN

## 2013-02-18 MED ORDER — EPHEDRINE 5 MG/ML INJ
10.0000 mg | INTRAVENOUS | Status: DC | PRN
  Administered 2013-02-18: 10 mg via INTRAVENOUS

## 2013-02-18 MED ORDER — IBUPROFEN 600 MG PO TABS: 600.0000 mg | ORAL_TABLET | Freq: Four times a day (QID) | ORAL | Status: DC | PRN

## 2013-02-18 MED ORDER — TERBUTALINE SULFATE 1 MG/ML IJ SOLN: 0.2500 mg | Freq: Once | INTRAMUSCULAR | Status: AC | PRN

## 2013-02-18 MED ORDER — LACTATED RINGERS IV SOLN: 500.0000 mL | INTRAVENOUS | Status: DC | PRN

## 2013-02-18 MED ORDER — EPHEDRINE 5 MG/ML INJ
10.0000 mg | INTRAVENOUS | Status: DC | PRN
  Filled 2013-02-18: qty 4

## 2013-02-18 MED ORDER — LIDOCAINE HCL (PF) 1 % IJ SOLN: 30.0000 mL | INTRAMUSCULAR | Status: DC | PRN

## 2013-02-18 MED ORDER — DIPHENHYDRAMINE HCL 50 MG/ML IJ SOLN
12.5000 mg | INTRAMUSCULAR | Status: DC | PRN
  Administered 2013-02-19: 12.5 mg via INTRAVENOUS
  Filled 2013-02-18: qty 1

## 2013-02-18 MED ORDER — CITRIC ACID-SODIUM CITRATE 334-500 MG/5ML PO SOLN
30.0000 mL | ORAL | Status: DC | PRN
  Administered 2013-02-19: 30 mL via ORAL
  Filled 2013-02-18: qty 15

## 2013-02-18 MED ORDER — OXYCODONE-ACETAMINOPHEN 5-325 MG PO TABS: 1.0000 | ORAL_TABLET | ORAL | Status: DC | PRN

## 2013-02-18 MED ORDER — ACETAMINOPHEN 325 MG PO TABS
650.0000 mg | ORAL_TABLET | ORAL | Status: DC | PRN
  Administered 2013-02-19: 650 mg via ORAL
  Filled 2013-02-18: qty 2

## 2013-02-18 MED ORDER — PHENYLEPHRINE 40 MCG/ML (10ML) SYRINGE FOR IV PUSH (FOR BLOOD PRESSURE SUPPORT): 80.0000 ug | PREFILLED_SYRINGE | INTRAVENOUS | Status: DC | PRN

## 2013-02-18 MED ORDER — LACTATED RINGERS IV SOLN
INTRAVENOUS | Status: DC
  Administered 2013-02-18 – 2013-02-19 (×6): via INTRAVENOUS

## 2013-02-18 MED ORDER — OXYTOCIN 40 UNITS IN LACTATED RINGERS INFUSION - SIMPLE MED
1.0000 m[IU]/min | INTRAVENOUS | Status: DC
  Administered 2013-02-18: 2 m[IU]/min via INTRAVENOUS
  Filled 2013-02-18: qty 1000

## 2013-02-18 NOTE — Anesthesia Preprocedure Evaluation (Signed)
Anesthesia Evaluation  Patient identified by MRN, date of birth, ID band Patient awake    Reviewed: Allergy & Precautions, H&P , Patient's Chart, lab work & pertinent test results  Airway Mallampati: II TM Distance: >3 FB Neck ROM: full    Dental  (+) Teeth Intact   Pulmonary asthma , Current Smoker,  breath sounds clear to auscultation        Cardiovascular Rhythm:regular Rate:Normal     Neuro/Psych    GI/Hepatic GERD-  Medicated,  Endo/Other  Morbid obesity  Renal/GU      Musculoskeletal   Abdominal   Peds  Hematology   Anesthesia Other Findings       Reproductive/Obstetrics (+) Pregnancy                           Anesthesia Physical Anesthesia Plan  ASA: III  Anesthesia Plan: Epidural   Post-op Pain Management:    Induction:   Airway Management Planned:   Additional Equipment:   Intra-op Plan:   Post-operative Plan:   Informed Consent: I have reviewed the patients History and Physical, chart, labs and discussed the procedure including the risks, benefits and alternatives for the proposed anesthesia with the patient or authorized representative who has indicated his/her understanding and acceptance.   Dental Advisory Given  Plan Discussed with:   Anesthesia Plan Comments: (Labs checked- platelets confirmed with RN in room. Fetal heart tracing, per RN, reported to be stable enough for sitting procedure. Discussed epidural, and patient consents to the procedure:  included risk of possible headache,backache, failed block, allergic reaction, and nerve injury. This patient was asked if she had any questions or concerns before the procedure started.)        Anesthesia Quick Evaluation

## 2013-02-18 NOTE — H&P (Signed)
Admission History and Physical Exam for an Obstetrics Patient  Ms. Martha Thompson is a 28 y.o. female, G3P0020, at 3368w2d gestation, who presents for rupture of membranes at 9:50 AM today. [facility]. See history below.  OB History   Grav Para Term Preterm Abortions TAB SAB Ect Mult Living   3    2  1 1   0      Past Medical History  Diagnosis Date  . HYPERLIPIDEMIA 06/14/2007  . Morbid obesity 09/04/2006  . ANXIETY 09/04/2006  . COMMON MIGRAINE 06/14/2007  . BLEPHARITIS, LEFT 05/21/2008  . OTITIS MEDIA, ACUTE, LEFT 03/27/2008  . SINUSITIS- ACUTE-NOS 10/11/2007  . URI 10/29/2009  . ALLERGIC RHINITIS 10/30/2009  . ASTHMA 12/25/2006  . ASTHMA, WITH ACUTE EXACERBATION 05/03/2009  . URTICARIA 05/03/2009  . LOW BACK PAIN 09/04/2006  . TENOSYNOVITIS, WRIST 10/11/2007  . GANGLION CYST, WRIST, LEFT 10/11/2007  . Headache(784.0) 10/23/2008  . GERD (gastroesophageal reflux disease) 04/24/2012    Prescriptions prior to admission  Medication Sig Dispense Refill  . acetaminophen (TYLENOL) 500 MG tablet Take 1,000 mg by mouth every 8 (eight) hours as needed for moderate pain.      Marland Kitchen. albuterol (PROVENTIL HFA;VENTOLIN HFA) 108 (90 BASE) MCG/ACT inhaler Inhale 2 puffs into the lungs every 6 (six) hours as needed.  3 Inhaler  3  . calcium carbonate (TUMS - DOSED IN MG ELEMENTAL CALCIUM) 500 MG chewable tablet Chew 2 tablets by mouth daily as needed for indigestion or heartburn.      . cetirizine (ZYRTEC) 10 MG tablet Take 1 tablet (10 mg total) by mouth daily.  90 tablet  3  . diphenhydrAMINE (BENADRYL) 25 MG tablet Take 25 mg by mouth at bedtime as needed for sleep.      . Prenatal Vit-Fe Fumarate-FA (PRENATAL MULTIVITAMIN) TABS tablet Take 1 tablet by mouth daily at 12 noon.        Past Surgical History  Procedure Laterality Date  . Back surgury  01/2003    s/p lumbar disc  . Laparoscopy   04/07/2011    Procedure: LAPAROSCOPY OPERATIVE;  Surgeon: Meriel Picaichard M Holland, MD;  Location: [facility];  Service: Gynecology;  Laterality: N/A;  left salpingogectomy    No Known Allergies  Family History: family history includes Anxiety disorder in her father; Diabetes in her father.  Social History:  reports that she has been smoking.  She does not have any smokeless tobacco history on file. She reports that she drinks alcohol. She reports that she does not use illicit drugs.  Review of systems: Normal pregnancy complaints.  Admission Physical Exam:  Dilation: 1.5 Effacement (%): 50 Station: -3 Exam by:: Dr. Stefano GaulStringer, MD Body mass index is 48.06 kg/(m^2).  Blood pressure 141/87, pulse 128, temperature 97.9 F (36.6 C), temperature source Oral, resp. rate 20, height 5\' 11"  (1.803 m), weight 344 lb 6.4 oz (156.219 kg).  HEENT:                 Within normal limits Chest:                   Clear Heart:                    Regular rate and rhythm Abdomen:             Gravid and nontender Extremities:  Grossly normal Neurologic exam: Grossly normal  Prenatal labs: ABO, Rh:              O+ HBsAg:                   NR HIV:                         NR GBS:                      Pos in Urine culture Antibody:               Neg Rubella:                 Immune RPR:                      NR  Fetal heart tones: Category 1 Contractions are mild     Prenatal Transfer Tool  Maternal Diabetes: No Genetic Screening: Declined Maternal Ultrasounds/Referrals: Normal Fetal Ultrasounds or other Referrals:  None Maternal Substance Abuse:  No Significant Maternal Medications:  None Significant Maternal Lab Results:  None Other Comments:  The patient received her flu shot and her TDAP shot.  Assessment:  [redacted]w[redacted]d gestation  Premature rupture membranes  Obesity  Asthma  Anemia  Anxiety  Cigarette smoker  Plan:  Admit to labor and delivery.  Begin beta strep  prophylaxis.   Martha Thompson V 02/18/2013, 11:48 AM

## 2013-02-18 NOTE — Progress Notes (Signed)
Martha HamperStephanie A Thompson is a 28 y.o. G3P0020 at 4880w2d admitted for rupture of membranes  Subjective:  Complains of painful contractions. The patient still labs and speaks with family.  Objective:  BP 114/59  Pulse 101  Temp(Src) 98.2 F (36.8 C) (Oral)  Resp 18  Ht 5\' 11"  (1.803 m)  Wt 344 lb 6.4 oz (156.219 kg)  BMI 48.06 kg/m2      FHT:  Cat: 1 UC:   Regular but not strong enough SVE:   Dilation: 3 Effacement (%): 50 Station: -2 Exam by:: Martha Thompson  IUPC placed.  Labs:  Lab Results  Component Value Date   WBC 16.6* 02/18/2013   HGB 13.6 02/18/2013   HCT 38.9 02/18/2013   MCV 90.5 02/18/2013   PLT 164 02/18/2013    Assessment / Plan:  Early labor  Labor: An adequate contractions. Increase Pitocin. Anticipated MOD:  NSVD  Martha Thompson 02/18/2013, 6:22 PM

## 2013-02-18 NOTE — MAU Note (Signed)
Pt presents to MAU with c/o rupture of membranes

## 2013-02-19 ENCOUNTER — Encounter (HOSPITAL_COMMUNITY)
Admission: AD | Disposition: A | Discharge status: Home / Self Care | Payer: Self-pay | Source: Ambulatory Visit | Attending: Obstetrics and Gynecology

## 2013-02-19 ENCOUNTER — Encounter (HOSPITAL_COMMUNITY): Payer: Self-pay | Admitting: *Deleted

## 2013-02-19 SURGERY — Surgical Case
Anesthesia: Epidural | Site: Abdomen

## 2013-02-19 MED ORDER — SODIUM BICARBONATE 8.4 % IV SOLN
INTRAVENOUS | Status: AC
  Filled 2013-02-19: qty 50

## 2013-02-19 MED ORDER — ONDANSETRON HCL 4 MG PO TABS: 4.0000 mg | ORAL_TABLET | ORAL | Status: DC | PRN

## 2013-02-19 MED ORDER — SODIUM CHLORIDE 0.9 % IJ SOLN: 3.0000 mL | INTRAMUSCULAR | Status: DC | PRN

## 2013-02-19 MED ORDER — PNEUMOCOCCAL VAC POLYVALENT 25 MCG/0.5ML IJ INJ
0.5000 mL | INJECTION | INTRAMUSCULAR | Status: AC
Start: 2013-02-20 — End: 2013-02-20
  Administered 2013-02-20: 0.5 mL via INTRAMUSCULAR
  Filled 2013-02-19 (×2): qty 0.5

## 2013-02-19 MED ORDER — SCOPOLAMINE 1 MG/3DAYS TD PT72
1.0000 | MEDICATED_PATCH | Freq: Once | TRANSDERMAL | Status: DC
  Administered 2013-02-19: 1.5 mg via TRANSDERMAL

## 2013-02-19 MED ORDER — LIDOCAINE-EPINEPHRINE (PF) 2 %-1:200000 IJ SOLN
INTRAMUSCULAR | Status: AC
  Filled 2013-02-19: qty 20

## 2013-02-19 MED ORDER — MEASLES, MUMPS & RUBELLA VAC ~~LOC~~ INJ
0.5000 mL | INJECTION | Freq: Once | SUBCUTANEOUS | Status: DC
  Filled 2013-02-19: qty 0.5

## 2013-02-19 MED ORDER — LORATADINE 10 MG PO TABS
10.0000 mg | ORAL_TABLET | Freq: Every day | ORAL | Status: DC
  Administered 2013-02-19: 10 mg via ORAL
  Filled 2013-02-19 (×3): qty 1

## 2013-02-19 MED ORDER — SIMETHICONE 80 MG PO CHEW: 80.0000 mg | CHEWABLE_TABLET | ORAL | Status: DC | PRN

## 2013-02-19 MED ORDER — SENNOSIDES-DOCUSATE SODIUM 8.6-50 MG PO TABS
2.0000 | ORAL_TABLET | ORAL | Status: DC
  Administered 2013-02-19 – 2013-02-20 (×2): 2 via ORAL
  Filled 2013-02-19 (×2): qty 2

## 2013-02-19 MED ORDER — SIMETHICONE 80 MG PO CHEW
80.0000 mg | CHEWABLE_TABLET | Freq: Three times a day (TID) | ORAL | Status: DC
  Administered 2013-02-19 – 2013-02-21 (×6): 80 mg via ORAL
  Filled 2013-02-19 (×6): qty 1

## 2013-02-19 MED ORDER — MORPHINE SULFATE (PF) 0.5 MG/ML IJ SOLN
INTRAMUSCULAR | Status: DC | PRN
Start: 2013-02-19 — End: 2013-02-19
  Administered 2013-02-19: 4 mg via EPIDURAL

## 2013-02-19 MED ORDER — LANOLIN HYDROUS EX OINT: 1.0000 "application " | TOPICAL_OINTMENT | CUTANEOUS | Status: DC | PRN

## 2013-02-19 MED ORDER — DIPHENHYDRAMINE HCL 25 MG PO CAPS
25.0000 mg | ORAL_CAPSULE | ORAL | Status: DC | PRN
  Filled 2013-02-19: qty 1

## 2013-02-19 MED ORDER — IBUPROFEN 600 MG PO TABS
600.0000 mg | ORAL_TABLET | Freq: Four times a day (QID) | ORAL | Status: DC
  Administered 2013-02-19 – 2013-02-21 (×8): 600 mg via ORAL
  Filled 2013-02-19 (×8): qty 1

## 2013-02-19 MED ORDER — METOCLOPRAMIDE HCL 5 MG/ML IJ SOLN: 10.0000 mg | Freq: Three times a day (TID) | INTRAMUSCULAR | Status: DC | PRN

## 2013-02-19 MED ORDER — NALBUPHINE HCL 10 MG/ML IJ SOLN: 5.0000 mg | INTRAMUSCULAR | Status: DC | PRN

## 2013-02-19 MED ORDER — IBUPROFEN 600 MG PO TABS: 600.0000 mg | ORAL_TABLET | Freq: Four times a day (QID) | ORAL | Status: DC | PRN

## 2013-02-19 MED ORDER — DIPHENHYDRAMINE HCL 25 MG PO CAPS
25.0000 mg | ORAL_CAPSULE | Freq: Four times a day (QID) | ORAL | Status: DC | PRN
  Administered 2013-02-20: 25 mg via ORAL
  Filled 2013-02-19 (×2): qty 1

## 2013-02-19 MED ORDER — PHENYLEPHRINE 8 MG IN D5W 100 ML (0.08MG/ML) PREMIX OPTIME
INJECTION | INTRAVENOUS | Status: DC | PRN
  Administered 2013-02-19: 30 ug/min via INTRAVENOUS

## 2013-02-19 MED ORDER — PROMETHAZINE HCL 25 MG/ML IJ SOLN: 6.2500 mg | INTRAMUSCULAR | Status: DC | PRN

## 2013-02-19 MED ORDER — SODIUM BICARBONATE 8.4 % IV SOLN
INTRAVENOUS | Status: DC | PRN
  Administered 2013-02-19 (×3): 5 mL via EPIDURAL

## 2013-02-19 MED ORDER — DEXTROSE 5 % IV SOLN
INTRAVENOUS | Status: AC
  Filled 2013-02-19: qty 3000

## 2013-02-19 MED ORDER — KETOROLAC TROMETHAMINE 30 MG/ML IJ SOLN
30.0000 mg | Freq: Four times a day (QID) | INTRAMUSCULAR | Status: DC | PRN
  Administered 2013-02-19: 30 mg via INTRAMUSCULAR

## 2013-02-19 MED ORDER — FLEET ENEMA 7-19 GM/118ML RE ENEM
1.0000 | ENEMA | Freq: Every day | RECTAL | Status: DC | PRN
Start: 2013-02-19 — End: 2013-02-21

## 2013-02-19 MED ORDER — MENTHOL 3 MG MT LOZG
1.0000 | LOZENGE | OROMUCOSAL | Status: DC | PRN
Start: 2013-02-19 — End: 2013-02-21

## 2013-02-19 MED ORDER — ONDANSETRON HCL 4 MG/2ML IJ SOLN
4.0000 mg | INTRAMUSCULAR | Status: DC | PRN
Start: 2013-02-19 — End: 2013-02-21

## 2013-02-19 MED ORDER — FENTANYL 2.5 MCG/ML BUPIVACAINE 1/10 % EPIDURAL INFUSION (WH - ANES): INTRAMUSCULAR | Status: DC | PRN

## 2013-02-19 MED ORDER — KETOROLAC TROMETHAMINE 30 MG/ML IJ SOLN
INTRAMUSCULAR | Status: AC
Start: 2013-02-19 — End: 2013-02-20
  Filled 2013-02-19: qty 1

## 2013-02-19 MED ORDER — NALBUPHINE HCL 10 MG/ML IJ SOLN
5.0000 mg | INTRAMUSCULAR | Status: DC | PRN
  Administered 2013-02-19: 10 mg via INTRAVENOUS
  Administered 2013-02-19: 5 mg via INTRAVENOUS
  Administered 2013-02-19 – 2013-02-20 (×2): 10 mg via INTRAVENOUS
  Filled 2013-02-19 (×4): qty 1

## 2013-02-19 MED ORDER — OXYCODONE-ACETAMINOPHEN 5-325 MG PO TABS
1.0000 | ORAL_TABLET | ORAL | Status: DC | PRN
  Administered 2013-02-19: 1 via ORAL
  Administered 2013-02-20 (×6): 2 via ORAL
  Administered 2013-02-21: 1 via ORAL
  Administered 2013-02-21: 2 via ORAL
  Administered 2013-02-21: 1 via ORAL
  Filled 2013-02-19 (×3): qty 2
  Filled 2013-02-19: qty 1
  Filled 2013-02-19 (×2): qty 2
  Filled 2013-02-19: qty 1
  Filled 2013-02-19: qty 2
  Filled 2013-02-19: qty 1
  Filled 2013-02-19: qty 2

## 2013-02-19 MED ORDER — BISACODYL 10 MG RE SUPP: 10.0000 mg | Freq: Every day | RECTAL | Status: DC | PRN

## 2013-02-19 MED ORDER — OXYTOCIN 40 UNITS IN LACTATED RINGERS INFUSION - SIMPLE MED: 62.5000 mL/h | INTRAVENOUS | Status: AC

## 2013-02-19 MED ORDER — FENTANYL CITRATE 0.05 MG/ML IJ SOLN
INTRAMUSCULAR | Status: AC
  Filled 2013-02-19: qty 5

## 2013-02-19 MED ORDER — FENTANYL CITRATE 0.05 MG/ML IJ SOLN
INTRAMUSCULAR | Status: DC | PRN
  Administered 2013-02-19: 100 ug via EPIDURAL

## 2013-02-19 MED ORDER — KETOROLAC TROMETHAMINE 30 MG/ML IJ SOLN: 30.0000 mg | Freq: Four times a day (QID) | INTRAMUSCULAR | Status: DC | PRN

## 2013-02-19 MED ORDER — SIMETHICONE 80 MG PO CHEW
80.0000 mg | CHEWABLE_TABLET | ORAL | Status: DC
  Administered 2013-02-19 – 2013-02-20 (×2): 80 mg via ORAL
  Filled 2013-02-19 (×2): qty 1

## 2013-02-19 MED ORDER — MORPHINE SULFATE 0.5 MG/ML IJ SOLN
INTRAMUSCULAR | Status: AC
  Filled 2013-02-19: qty 10

## 2013-02-19 MED ORDER — ONDANSETRON HCL 4 MG/2ML IJ SOLN: 4.0000 mg | Freq: Three times a day (TID) | INTRAMUSCULAR | Status: DC | PRN

## 2013-02-19 MED ORDER — PHENYLEPHRINE 8 MG IN D5W 100 ML (0.08MG/ML) PREMIX OPTIME
INJECTION | INTRAVENOUS | Status: AC
  Filled 2013-02-19: qty 100

## 2013-02-19 MED ORDER — ALBUTEROL SULFATE (2.5 MG/3ML) 0.083% IN NEBU: 3.0000 mL | INHALATION_SOLUTION | RESPIRATORY_TRACT | Status: DC | PRN

## 2013-02-19 MED ORDER — NALOXONE HCL 0.4 MG/ML IJ SOLN: 0.4000 mg | INTRAMUSCULAR | Status: DC | PRN

## 2013-02-19 MED ORDER — DIPHENHYDRAMINE HCL 50 MG/ML IJ SOLN: 12.5000 mg | INTRAMUSCULAR | Status: DC | PRN

## 2013-02-19 MED ORDER — HYDROMORPHONE HCL PF 1 MG/ML IJ SOLN: 0.2500 mg | INTRAMUSCULAR | Status: DC | PRN

## 2013-02-19 MED ORDER — PRENATAL MULTIVITAMIN CH
1.0000 | ORAL_TABLET | Freq: Every day | ORAL | Status: DC
Start: 2013-02-19 — End: 2013-02-21
  Administered 2013-02-20 – 2013-02-21 (×2): 1 via ORAL
  Filled 2013-02-19 (×2): qty 1

## 2013-02-19 MED ORDER — LORATADINE 10 MG PO TABS
10.0000 mg | ORAL_TABLET | Freq: Every day | ORAL | Status: DC
  Administered 2013-02-20 – 2013-02-21 (×2): 10 mg via ORAL
  Filled 2013-02-19 (×4): qty 1

## 2013-02-19 MED ORDER — LORATADINE 10 MG PO TABS: 10.0000 mg | ORAL_TABLET | Freq: Every day | ORAL | Status: DC

## 2013-02-19 MED ORDER — DEXTROSE 5 % IV SOLN
3.0000 g | INTRAVENOUS | Status: DC | PRN
  Administered 2013-02-19: 3 g via INTRAVENOUS

## 2013-02-19 MED ORDER — LACTATED RINGERS IV SOLN
INTRAVENOUS | Status: DC
  Administered 2013-02-19: 16:00:00 via INTRAVENOUS

## 2013-02-19 MED ORDER — NALOXONE HCL 1 MG/ML IJ SOLN
1.0000 ug/kg/h | INTRAVENOUS | Status: DC | PRN
  Filled 2013-02-19: qty 2

## 2013-02-19 MED ORDER — MEPERIDINE HCL 25 MG/ML IJ SOLN: 6.2500 mg | INTRAMUSCULAR | Status: DC | PRN

## 2013-02-19 MED ORDER — ONDANSETRON HCL 4 MG/2ML IJ SOLN
INTRAMUSCULAR | Status: DC | PRN
  Administered 2013-02-19: 4 mg via INTRAVENOUS

## 2013-02-19 MED ORDER — DIPHENHYDRAMINE HCL 50 MG/ML IJ SOLN: 25.0000 mg | INTRAMUSCULAR | Status: DC | PRN

## 2013-02-19 MED ORDER — TETANUS-DIPHTH-ACELL PERTUSSIS 5-2.5-18.5 LF-MCG/0.5 IM SUSP: 0.5000 mL | Freq: Once | INTRAMUSCULAR | Status: DC

## 2013-02-19 MED ORDER — OXYTOCIN 10 UNIT/ML IJ SOLN
40.0000 [IU] | INTRAVENOUS | Status: DC | PRN
  Administered 2013-02-19: 40 [IU] via INTRAVENOUS

## 2013-02-19 MED ORDER — SCOPOLAMINE 1 MG/3DAYS TD PT72
MEDICATED_PATCH | TRANSDERMAL | Status: AC
  Filled 2013-02-19: qty 1

## 2013-02-19 MED ORDER — OXYTOCIN 10 UNIT/ML IJ SOLN
INTRAMUSCULAR | Status: AC
Start: 2013-02-19 — End: 2013-02-19
  Filled 2013-02-19: qty 4

## 2013-02-19 MED ORDER — DIBUCAINE 1 % RE OINT: 1.0000 "application " | TOPICAL_OINTMENT | RECTAL | Status: DC | PRN

## 2013-02-19 MED ORDER — WITCH HAZEL-GLYCERIN EX PADS: 1.0000 "application " | MEDICATED_PAD | CUTANEOUS | Status: DC | PRN

## 2013-02-19 MED ORDER — KETOROLAC TROMETHAMINE 30 MG/ML IJ SOLN: 15.0000 mg | Freq: Once | INTRAMUSCULAR | Status: DC | PRN

## 2013-02-19 MED ORDER — FERROUS SULFATE 325 (65 FE) MG PO TABS
325.0000 mg | ORAL_TABLET | Freq: Two times a day (BID) | ORAL | Status: DC
Start: 2013-02-19 — End: 2013-02-21
  Administered 2013-02-20 – 2013-02-21 (×3): 325 mg via ORAL
  Filled 2013-02-19 (×4): qty 1

## 2013-02-19 MED ORDER — ZOLPIDEM TARTRATE 5 MG PO TABS: 5.0000 mg | ORAL_TABLET | Freq: Every evening | ORAL | Status: DC | PRN

## 2013-02-19 MED ORDER — ONDANSETRON HCL 4 MG/2ML IJ SOLN
INTRAMUSCULAR | Status: AC
  Filled 2013-02-19: qty 4

## 2013-02-19 SURGICAL SUPPLY — 38 items
BENZOIN TINCTURE PRP APPL 2/3 (GAUZE/BANDAGES/DRESSINGS) ×4 IMPLANT
CLAMP CORD UMBIL (MISCELLANEOUS) IMPLANT
CLOTH BEACON ORANGE TIMEOUT ST (SAFETY) ×2 IMPLANT
CONTAINER PREFILL 10% NBF 15ML (MISCELLANEOUS) IMPLANT
DRAIN JACKSON PRT FLT 10 (DRAIN) ×2 IMPLANT
DRAPE LG THREE QUARTER DISP (DRAPES) IMPLANT
DRSG OPSITE POSTOP 4X10 (GAUZE/BANDAGES/DRESSINGS) ×2 IMPLANT
DURAPREP 26ML APPLICATOR (WOUND CARE) ×2 IMPLANT
ELECT REM PT RETURN 9FT ADLT (ELECTROSURGICAL) ×2
ELECTRODE REM PT RTRN 9FT ADLT (ELECTROSURGICAL) ×1 IMPLANT
EVACUATOR SILICONE 100CC (DRAIN) IMPLANT
EXTRACTOR VACUUM M CUP 4 TUBE (SUCTIONS) IMPLANT
GLOVE BIO SURGEON STRL SZ 6.5 (GLOVE) ×2 IMPLANT
GLOVE BIOGEL PI IND STRL 7.0 (GLOVE) ×1 IMPLANT
GLOVE BIOGEL PI INDICATOR 7.0 (GLOVE) ×1
GOWN STRL REUS W/ TWL XL LVL3 (GOWN DISPOSABLE) ×1 IMPLANT
GOWN STRL REUS W/TWL LRG LVL3 (GOWN DISPOSABLE) ×2 IMPLANT
GOWN STRL REUS W/TWL XL LVL3 (GOWN DISPOSABLE) ×1
KIT ABG SYR 3ML LUER SLIP (SYRINGE) IMPLANT
NEEDLE HYPO 25X5/8 SAFETYGLIDE (NEEDLE) IMPLANT
NS IRRIG 1000ML POUR BTL (IV SOLUTION) ×2 IMPLANT
PACK C SECTION WH (CUSTOM PROCEDURE TRAY) ×2 IMPLANT
PAD ABD 7.5X8 STRL (GAUZE/BANDAGES/DRESSINGS) ×4 IMPLANT
PAD OB MATERNITY 4.3X12.25 (PERSONAL CARE ITEMS) ×2 IMPLANT
RTRCTR C-SECT PINK 25CM LRG (MISCELLANEOUS) ×2 IMPLANT
SPONGE SURGIFOAM ABS GEL 12-7 (HEMOSTASIS) ×2 IMPLANT
STRIP CLOSURE SKIN 1/2X4 (GAUZE/BANDAGES/DRESSINGS) ×2 IMPLANT
SUT CHROMIC 0 CT 1 (SUTURE) ×2 IMPLANT
SUT MNCRL AB 3-0 PS2 27 (SUTURE) IMPLANT
SUT PLAIN 2 0 (SUTURE)
SUT PLAIN 2 0 XLH (SUTURE) ×2 IMPLANT
SUT PLAIN ABS 2-0 CT1 27XMFL (SUTURE) IMPLANT
SUT SILK 2 0 SH (SUTURE) ×2 IMPLANT
SUT VIC AB 0 CTX 36 (SUTURE) ×4
SUT VIC AB 0 CTX36XBRD ANBCTRL (SUTURE) ×4 IMPLANT
TOWEL OR 17X24 6PK STRL BLUE (TOWEL DISPOSABLE) ×2 IMPLANT
TRAY FOLEY CATH 14FR (SET/KITS/TRAYS/PACK) IMPLANT
WATER STERILE IRR 1000ML POUR (IV SOLUTION) IMPLANT

## 2013-02-19 NOTE — Progress Notes (Signed)
Patient ID: Baron HamperStephanie A Whiteford, female   DOB: 07/20/1985, 28 y.o.   MRN: [id number] Pt states she is tired and desires a cesarean  BP 133/71  Pulse 108  Temp(Src) 98.1 F (36.7 C) (Oral)  Resp 18  Ht 5\' 11"  (1.803 m)  Wt 344 lb 6.4 oz (156.219 kg)  BMI 48.06 kg/m2  SpO2 97% NST category 1 toco q 1 minute cx unchanged I explained to the pt that latent labor can last this long and we can stop the pitocin and restart it to try to establish a better labor pattern She desires elective CS.  Risks of bleeding, infection damage to internal organs all reviewed with the pt.   She desires to proceed.  Consent signed Date of Initial H&P: 2/10  History reviewed, patient examined, no change in status, stable for surgery.

## 2013-02-19 NOTE — Transfer of Care (Signed)
Immediate Anesthesia Transfer of Care Note  Patient: Baron HamperStephanie A Gossen  Procedure(s) Performed: Procedure(s): CESAREAN SECTION (N/A)  Patient Location: PACU  Anesthesia Type:Epidural  Level of Consciousness: awake, alert  and oriented  Airway & Oxygen Therapy: Patient Spontanous Breathing  Post-op Assessment: Report given to PACU RN and Post -op Vital signs reviewed and stable  Post vital signs: Reviewed and stable  Complications: No apparent anesthesia complications

## 2013-02-19 NOTE — Op Note (Signed)
Cesarean Section Procedure Note   Martha HamperStephanie A Thompson  02/18/2013 - 02/19/2013  Indications: Pt with PROM in latent phase labor and requested cesarean section   Pre-operative Diagnosis: PROM AND MATERNAL REQUEST   Post-operative Diagnosis: Same   Surgeon: Surgeon(s) and Role:    * Michael LitterNaima A Demarkus Remmel, MD - Primary   Assistants: none   Anesthesia: epidural   Procedure Details:  The patient was seen in the Holding Room. The risks, benefits, complications, treatment options, and expected outcomes were discussed with the patient. The patient concurred with the proposed plan, giving informed consent. identified as Martha Thompson and the procedure verified as C-Section Delivery. A Time Out was held and the above information confirmed.  After induction of anesthesia, the patient was draped and prepped in the usual sterile manner. A transverse incision was made and carried down through the subcutaneous tissue to the fascia. Fascial incision was made in the midline and extended transversely. The fascia was separated from the underlying rectus muscle superiorly and inferiorly. The peritoneum was identified and entered. Peritoneal incision was extended longitudinally with good visualization of bowel and bladder. The utero-vesical peritoneal reflection was incised transversely and the bladder flap was bluntly freed from the lower uterine segment.  An alexsis retractor was placed in the abdomen.   A low transverse uterine incision was made. Delivered from cephalic presentation was a  infant, with Apgar scores of 8 at one minute and 9 at five minutes. Cord ph was not sent the umbilical cord was clamped and cut cord blood was obtained for evaluation. The placenta was removed Intact and appeared normal. The uterine outline, tubes and ovaries appeared normal}. The uterine incision was closed with running locked sutures of 0Vicryl. A second layer 0 vicrlyl was used to imbricate the uterine incision     Hemostasis was observed. Lavage was carried out until clear. The alexsis was removed.  The peritoneum was closed with 0 chromic.  The muscles were examined and any bleeders were made hemostatic using bovie cautery device.   The fascia was then reapproximated with running sutures of 0 vicryl. Gelfoam placed on the muscle in the right lower quadrant.  The JP drain was placed in the SQ tissue.    The subcutaneous tissue was reapproximated  With interrupted stitches using 2-0 plain gut. The subcuticular closure was performed using 3-690monocryl     Instrument, sponge, and needle counts were correct prior the abdominal closure and were correct at the conclusion of the case.    Findings: infant was delivered from vertex  presentation. The fluid was clear.  The uterus tubes and ovaries appeared normal.     Estimated Blood Loss: 1000ml   Total IV Fluids: 2000ml   Urine Output: 200CC OF clear urine  Specimens: placenta to pathology  Complications: no complications  Disposition: PACU - hemodynamically stable.   Maternal Condition: stable   Baby condition / location:  Couplet care / Skin to Skin  Attending Attestation: I performed the procedure.   Signed: Surgeon(s): Michael LitterNaima A Shadell Brenn, MD

## 2013-02-19 NOTE — Progress Notes (Signed)
  Subjective: Comfortable with epidural, has been sleeping at intervals.  Family at bedside.  Objective: BP 137/80  Pulse 102  Temp(Src) 97.9 F (36.6 C) (Oral)  Resp 18  Ht 5\' 11"  (1.803 m)  Wt 344 lb 6.4 oz (156.219 kg)  BMI 48.06 kg/m2  SpO2 97%   Total I/O In: -  Out: 50 [Urine:50]  FHT:  Category 1 UC:   irregular, every 2-5 minutes SVE:   Dilation: 3.5 Effacement (%): 80 Station: -2 Exam by:: Manfred ArchV. Pierre Dellarocco, CNM MVUs ranging from 170-220 Pitocin on 18 mu/min.  Assessment / Plan: Early labor Inadequate contractions. Reviewed status with patient and family, including inadequate labor likely rationale for no progress.  Advised them we would continue to work toward persistently adequate labor--only then can we determine if progress is or is not going to occur, as long as baby and patient remain stable. Patient and family seem to understand. Martha Thompson 02/19/2013, 3:01 AM

## 2013-02-19 NOTE — Anesthesia Postprocedure Evaluation (Signed)
  Anesthesia Post-op Note  Patient: Martha HamperStephanie A Thompson  Procedure(s) Performed: Procedure(s): CESAREAN SECTION (N/A)  Patient Location: Mother/Baby  Anesthesia Type:Epidural  Level of Consciousness: awake, alert , oriented and patient cooperative  Airway and Oxygen Therapy: Patient Spontanous Breathing  Post-op Pain: mild  Post-op Assessment: Post-op Vital signs reviewed, Patient's Cardiovascular Status Stable, Respiratory Function Stable, Patent Airway, No signs of Nausea or vomiting, Adequate PO intake and Pain level controlled  Post-op Vital Signs: Reviewed  Complications: No apparent anesthesia complications

## 2013-02-19 NOTE — Progress Notes (Signed)
Noted wide flat space between breast. Very little breast tisuue. Cone shaped nipples. Expresses small amount colostrum. Electric pump applied w/no results. Explained needed to stimulate breast. Baby not interested in feeding. Lots of skin to skin provided. Mom has generalized edema.

## 2013-02-19 NOTE — Lactation Note (Signed)
This note was copied from the chart of Martha Jobe IgoStephanie Nishikawa. Lactation Consultation Note Initial visit at 6 hours of age.  Mom holding baby skin to skin and recently offered breast.  Baby asleep not showing feeding cues.  Mom reports that she did not notice breast changes during pregnancy, but has had some leaking.  Breast are tubular shaped and wide spaced while mom is reclined recovering from c-section.  Hand expression demonstrated with colostrum visible.  Mom has [facility] av[facility] pump [facility], but is willing to use hospital pump as needed.  Mom has had a 1000 estimated blood loss.  Encouraged mom to hold skin to skin and feed with early feeding cues and consider pumping later, as colostrum in now available.  [facility] resources given and discussed.    Patient Name: Martha Thompson Today's Date: 02/19/2013 Reason for consult: Initial assessment   Maternal Data Formula Feeding for Exclusion: No Has patient been taught Hand Expression?: Yes Does the patient have breastfeeding experience prior to this delivery?: No  Feeding Feeding Type: Breast Fed Length of feed: 0 min  LATCH Score/Interventions                Intervention(s): Breastfeeding basics reviewed     Lactation Tools Discussed/Used     Consult Status Consult Status: Follow-up Date: 02/20/13 Follow-up type: In-patient    Jannifer RodneyShoptaw, Tyrome Donatelli Lynn 02/19/2013, 6:40 PM

## 2013-02-19 NOTE — Progress Notes (Addendum)
  Subjective: Comfortable with epidural.  Objective: BP 134/71  Pulse 98  Temp(Src) 97.9 F (36.6 C) (Oral)  Resp 18  Ht 5\' 11"  (1.803 m)  Wt 344 lb 6.4 oz (156.219 kg)  BMI 48.06 kg/m2  SpO2 97%   Total I/O In: -  Out: 50 [Urine:50]  FHT:  Category 1 UC:   irregular, every 2-4 minutes SVE:   Dilation: 3.5 Effacement (%): 80 Station: -2 Exam by:: Manfred ArchV. Nori Winegar, CNM MVUs have ranged from 175-225 since MN, but pattern remains dysfunctional, with coupling/tripling at times. Pitocin on 22 mu/min  Assessment / Plan: Arrest of early labor GBS positive S/P SROM 9:50am Reviewed lack of progress, generally adequate MVUs, dysfunctional pattern of contractions, but reassuring maternal/fetal status with patient and family. Consulted with Dr. Stringer--currently recommends continuing to observe at present, in light of still early labor and reassuring maternal/fetal status. Reviewed plan with patient and family--they seem to understand and are willing to continue to observe at present, but hope, if no progress as the morning progresses, that the decision will be made for C/S. Dr. Stefano GaulStringer will update Dr. Normand Sloopillard regarding status.   Nigel BridgemanLATHAM, Jed Kutch 02/19/2013, 5:55 AM

## 2013-02-19 NOTE — Anesthesia Postprocedure Evaluation (Signed)
  Anesthesia Post Note  Patient: Baron HamperStephanie A Rump  Procedure(s) Performed: Procedure(s) (LRB): CESAREAN SECTION (N/A)  Anesthesia type: Epidural  Patient location: PACU  Post pain: Pain level controlled  Post assessment: Post-op Vital signs reviewed  Last Vitals:  Filed Vitals:   02/19/13 1230  BP: 141/72  Pulse: 97  Temp:   Resp: 12    Post vital signs: Reviewed  Level of consciousness: awake  Complications: No apparent anesthesia complications

## 2013-02-20 ENCOUNTER — Encounter (HOSPITAL_COMMUNITY): Payer: Self-pay | Admitting: Obstetrics and Gynecology

## 2013-02-20 LAB — CBC
HCT: 29.4 % — ABNORMAL LOW (ref 36.0–46.0)
HEMOGLOBIN: 9.9 g/dL — AB (ref 12.0–15.0)
MCH: 30.9 pg (ref 26.0–34.0)
MCHC: 33.7 g/dL (ref 30.0–36.0)
MCV: 91.9 fL (ref 78.0–100.0)
Platelets: 155 10*3/uL (ref 150–400)
RBC: 3.2 MIL/uL — AB (ref 3.87–5.11)
RDW: 13.6 % (ref 11.5–15.5)
WBC: 14.9 10*3/uL — ABNORMAL HIGH (ref 4.0–10.5)

## 2013-02-20 NOTE — Progress Notes (Signed)
Subjective: Postpartum Day 2: Cesarean Delivery due to PROM in latent phase, elective C/S after trial of augmentation. Patient up ad lib, reports no syncope or dizziness. Feeding:  Breast Contraceptive plan:  Undecided  Objective: Vital signs in last 24 hours: Temp:  [97.9 F (36.6 C)-98.3 F (36.8 C)] 98.3 F (36.8 C) (02/12 1846) Pulse Rate:  [97-119] 119 (02/12 1846) Resp:  [18-20] 20 (02/12 1846) BP: (109-144)/(64-71) 144/64 mmHg (02/12 1846) SpO2:  [91 %-96 %] 96 % (02/12 1042)  Physical Exam:  General: alert Lochia: appropriate Uterine Fundus: firm Incision: Honeycomb dressing CDI DT Evaluation: No evidence of DVT seen on physical exam. Negative Homan's sign. JP drain:   63 cc last shift.   Recent Labs  02/18/13 1205 02/20/13 0605  HGB 13.6 9.9*  HCT 38.9 29.4*    Assessment/Plan: Status post Cesarean section day 2 Doing well postoperatively.  Continue current care. Plan for discharge tomorrow    Nigel BridgemanLATHAM, Meighan Treto 02/20/2013, 11:10 PM

## 2013-02-20 NOTE — Addendum Note (Signed)
Addendum created 02/20/13 0802 by Turner DanielsJennifer L Remiel Corti, CRNA   Modules edited: Notes Section   Notes Section:  File: [id number]

## 2013-02-20 NOTE — Lactation Note (Addendum)
This note was copied from the chart of Martha Jobe IgoStephanie Holts. Lactation Consultation Note  Patient Name: Martha Thompson ZOXWR'UToday's Date: 02/20/2013 Reason for consult: Follow-up assessment Baby 29 hours old. Mom and nurse report that baby keeps a small/tight oral gape. Performed suck training with gloved finger. Baby's mouth did relax and lips continued to stay flanged once coaxed outward. Mom able to coax lips out after latching baby. Enc mom to hold baby close with baby's cheeks to mom's breast. Baby sustained a deep latch and was able to show bursts of rhythmic sucking with some swallows. Mom is able to hand express colostrum. Mom's breasts are wide-spaced and cone-shaped. Enc mom to post-pump for 15 minutes, a minimum of 4 times a day, and to pump after any unsuccessful latching. Enc mom to call out for assistance with latch as needed. Reviewed basics and enc lots of STS. Mom has already been pumping, pump was set up during the night per mom.  Maternal Data    Feeding Feeding Type: Breast Fed Length of feed:  (Still latched when LC left the room.)  LATCH Score/Interventions Latch: Repeated attempts needed to sustain latch, nipple held in mouth throughout feeding, stimulation needed to elicit sucking reflex. Intervention(s): Adjust position;Assist with latch;Breast massage;Breast compression  Audible Swallowing: A few with stimulation  Type of Nipple: Everted at rest and after stimulation  Comfort (Breast/Nipple): Soft / non-tender     Hold (Positioning): Assistance needed to correctly position infant at breast and maintain latch. Intervention(s): Breastfeeding basics reviewed;Support Pillows;Position options  LATCH Score: 7  Lactation Tools Discussed/Used     Consult Status Consult Status: Follow-up Date: 02/21/13 Follow-up type: In-patient    Geralynn OchsWILLIARD, Bern Fare 02/20/2013, 4:14 PM

## 2013-02-20 NOTE — Anesthesia Postprocedure Evaluation (Signed)
  Anesthesia Post-op Note  Anesthesia Post Note  Patient: Martha Thompson  Procedure(s) Performed: Procedure(s) (LRB): CESAREAN SECTION (N/A)  Anesthesia type: Epidural  Patient location: Mother/Baby  Post pain: Pain level controlled  Post assessment: Post-op Vital signs reviewed  Last Vitals:  Filed Vitals:   02/20/13 0601  BP: 109/70  Pulse: 100  Temp: 36.8 C  Resp: 18    Post vital signs: Reviewed  Level of consciousness:alert  Complications: No apparent anesthesia complications

## 2013-02-20 NOTE — Progress Notes (Signed)
Patient was referred for history of depression/anxiety. * Referral screened out by Clinical Social Worker because none of the following criteria appear to apply:  ~ History of anxiety/depression during this pregnancy, or of post-partum depression.  ~ Diagnosis of anxiety and/or depression within last 3 years  ~ History of depression due to pregnancy loss/loss of child  OR * Patient's symptoms currently being treated with medication (Xanax, PRN) and/or therapy.  Please contact the Clinical Social Worker if needs arise, or by the patient's request. Pt describes symptoms as manageable & does not express the need for further intervention.  

## 2013-02-20 NOTE — Progress Notes (Signed)
UR chart review completed.  

## 2013-02-20 NOTE — Progress Notes (Signed)
Subjective: Postpartum Day 1: Cesarean Delivery Patient reports tolerating PO, + flatus and no problems voiding.  Ambulating without difficulty.  Breast feeding.  Pt planning to take shower and then remove dressing with RN assistance.  Objective: Vital signs in last 24 hours: Temp:  [97.7 F (36.5 C)-98.6 F (37 C)] 98.2 F (36.8 C) (02/12 1042) Pulse Rate:  [89-128] 100 (02/12 1042) Resp:  [14-20] 20 (02/12 1042) BP: (108-147)/(63-96) 115/70 mmHg (02/12 1042) SpO2:  [91 %-98 %] 96 % (02/12 1042) JP drain in place - about 5cc in there now  Physical Exam:  General: alert and no distress Lochia: appropriate Uterine Fundus: firm Incision: pressure dressing c/d intact DVT Evaluation: No evidence of DVT seen on physical exam.   Recent Labs  02/18/13 1205 02/20/13 0605  HGB 13.6 9.9*  HCT 38.9 29.4*    Assessment/Plan: Status post Cesarean section. Doing well postoperatively.  Continue current care.   Purcell NailsROBERTS,Saje Gallop Y 02/20/2013, 12:42 PM

## 2013-02-21 DIAGNOSIS — Z98891 History of uterine scar from previous surgery: Secondary | ICD-10-CM

## 2013-02-21 MED ORDER — FERROUS SULFATE 325 (65 FE) MG PO TABS: 325.0000 mg | ORAL_TABLET | Freq: Two times a day (BID) | ORAL | Status: DC

## 2013-02-21 MED ORDER — IBUPROFEN 600 MG PO TABS: 600.0000 mg | ORAL_TABLET | Freq: Four times a day (QID) | ORAL | Status: DC

## 2013-02-21 MED ORDER — OXYCODONE-ACETAMINOPHEN 5-325 MG PO TABS: 1.0000 | ORAL_TABLET | ORAL | Status: DC | PRN

## 2013-02-21 NOTE — Discharge Instructions (Signed)
Postpartum Care After Cesarean Delivery °After you deliver your newborn (postpartum period), the usual stay in the hospital is 24 72 hours. If there were problems with your labor or delivery, or if you have other medical problems, you might be in the hospital longer.  °While you are in the hospital, you will receive help and instructions on how to care for yourself and your newborn during the postpartum period.  °While you are in the hospital: °· It is normal for you to have pain or discomfort from the incision in your abdomen. Be sure to tell your nurses when you are having pain, where the pain is located, and what makes the pain worse. °· If you are breastfeeding, you may feel uncomfortable contractions of your uterus for a couple of weeks. This is normal. The contractions help your uterus get back to normal size. °· It is normal to have some bleeding after delivery. °· For the first 1 3 days after delivery, the flow is red and the amount may be similar to a period. °· It is common for the flow to start and stop. °· In the first few days, you may pass some small clots. Let your nurses know if you begin to pass large clots or your flow increases. °· Do not  flush blood clots down the toilet before having the nurse look at them. °· During the next 3 10 days after delivery, your flow should become more watery and pink or brown-tinged in color. °· Ten to fourteen days after delivery, your flow should be a small amount of yellowish-white discharge. °· The amount of your flow will decrease over the first few weeks after delivery. Your flow may stop in 6 8 weeks. Most women have had their flow stop by 12 weeks after delivery. °· You should change your sanitary pads frequently. °· Wash your hands thoroughly with soap and water for at least 20 seconds after changing pads, using the toilet, or before holding or feeding your newborn. °· Your intravenous (IV) tubing will be removed when you are drinking enough fluids. °· The  urine drainage tube (urinary catheter) that was inserted before delivery may be removed within 6 8 hours after delivery or when feeling returns to your legs. You should feel like you need to empty your bladder within the first 6 8 hours after the catheter has been removed. °· In case you become weak, lightheaded, or faint, call your nurse before you get out of bed for the first time and before you take a shower for the first time. °· Within the first few days after delivery, your breasts may begin to feel tender and full. This is called engorgement. Breast tenderness usually goes away within 48 72 hours after engorgement occurs. You may also notice milk leaking from your breasts. If you are not breastfeeding, do not stimulate your breasts. Breast stimulation can make your breasts produce more milk. °· Spending as much time as possible with your newborn is very important. During this time, you and your newborn can feel close and get to know each other. Having your newborn stay in your room (rooming in) will help to strengthen the bond with your newborn. It will give you time to get to know your newborn and become comfortable caring for your newborn. °· Your hormones change after delivery. Sometimes the hormone changes can temporarily cause you to feel sad or tearful. These feelings should not last more than a few days. If these feelings last longer   than that, you should talk to your caregiver.  If desired, talk to your caregiver about methods of family planning or contraception.  Talk to your caregiver about immunizations. Your caregiver may want you to have the following immunizations before leaving the hospital:  Tetanus, diphtheria, and pertussis (Tdap) or tetanus and diphtheria (Td) immunization. It is very important that you and your family (including grandparents) or others caring for your newborn are up-to-date with the Tdap or Td immunizations. The Tdap or Td immunization can help protect your newborn  from getting ill.  Rubella immunization.  Varicella (chickenpox) immunization.  Influenza immunization. You should receive this annual immunization if you did not receive the immunization during your pregnancy. Document Released: 09/20/2011 Document Reviewed: 09/20/2011 [facility] Patient Information 2014 [place], [place].  Postpartum Depression and Baby Blues The postpartum period begins right after the birth of a baby. During this time, there is often a great amount of joy and excitement. It is also a time of considerable changes in the life of the parent(s). Regardless of how many times a mother gives birth, each child brings new challenges and dynamics to the family. It is not unusual to have feelings of excitement accompanied by confusing shifts in moods, emotions, and thoughts. All mothers are at risk of developing postpartum depression or the "baby blues." These mood changes can occur right after giving birth, or they may occur many months after giving birth. The baby blues or postpartum depression can be mild or severe. Additionally, postpartum depression can resolve rather quickly, or it can be a long-term condition. CAUSES Elevated hormones and their rapid decline are thought to be a main cause of postpartum depression and the baby blues. There are a number of hormones that radically change during and after pregnancy. Estrogen and progesterone usually decrease immediately after delivering your baby. The level of thyroid hormone and various cortisol steroids also rapidly drop. Other factors that play a major role in these changes include major life events and genetics.  RISK FACTORS If you have any of the following risks for the baby blues or postpartum depression, know what symptoms to watch out for during the postpartum period. Risk factors that may increase the likelihood of getting the baby blues or postpartum depression include:  Havinga personal or family history of  depression.  Having depression while being pregnant.  Having premenstrual or oral contraceptive-associated mood issues.  Having exceptional life stress.  Having marital conflict.  Lacking a social support network.  Having a baby with special needs.  Having health problems such as diabetes. SYMPTOMS Baby blues symptoms include:  Brief fluctuations in mood, such as going from extreme happiness to sadness.  Decreased concentration.  Difficulty sleeping.  Crying spells, tearfulness.  Irritability.  Anxiety. Postpartum depression symptoms typically begin within the first month after giving birth. These symptoms include:  Difficulty sleeping or excessive sleepiness.  Marked weight loss.  Agitation.  Feelings of worthlessness.  Lack of interest in activity or food. Postpartum psychosis is a very concerning condition and can be dangerous. Fortunately, it is rare. Displaying any of the following symptoms is cause for immediate medical attention. Postpartum psychosis symptoms include:  Hallucinations and delusions.  Bizarre or disorganized behavior.  Confusion or disorientation. DIAGNOSIS  A diagnosis is made by an evaluation of your symptoms. There are no medical or lab tests that lead to a diagnosis, but there are various questionnaires that a caregiver may use to identify those with the baby blues, postpartum depression, or psychosis. Often times,  a screening tool called the [place] Postnatal Depression Scale is used to diagnose depression in the postpartum period.  TREATMENT The baby blues usually goes away on its own in 1 to 2 weeks. Social support is often all that is needed. You should be encouraged to get adequate sleep and rest. Occasionally, you may be given medicines to help you sleep.  Postpartum depression requires treatment as it can last several months or longer if it is not treated. Treatment may include individual or group therapy, medicine, or both to  address any social, physiological, and psychological factors that may play a role in the depression. Regular exercise, a healthy diet, rest, and social support may also be strongly recommended.  Postpartum psychosis is more serious and needs treatment right away. Hospitalization is often needed. HOME CARE INSTRUCTIONS  Get as much rest as you can. Nap when the baby sleeps.  Exercise regularly. Some women find yoga and walking to be beneficial.  Eat a balanced and nourishing diet.  Do little things that you enjoy. Have a cup of tea, take a bubble bath, read your favorite magazine, or listen to your favorite music.  Avoid alcohol.  Ask for help with household chores, cooking, grocery shopping, or running errands as needed. Do not try to do everything.  Talk to people close to you about how you are feeling. Get support from your partner, family members, friends, or other new moms.  Try to stay positive in how you think. Think about the things you are grateful for.  Do not spend a lot of time alone.  Only take medicine as directed by your caregiver.  Keep all your postpartum appointments.  Let your caregiver know if you have any concerns. SEEK MEDICAL CARE IF: You are having a reaction or problems with your medicine. SEEK IMMEDIATE MEDICAL CARE IF:  You have suicidal feelings.  You feel you may harm the baby or someone else. Document Released: 09/30/2003 Document Revised: 03/20/2011 Document Reviewed: 10/07/2012 [facility] Patient Information 2014 [place], [place].  Breastfeeding Deciding to breastfeed is one of the best choices you can make for you and your baby. A change in hormones during pregnancy causes your breast tissue to grow and increases the number and size of your milk ducts. These hormones also allow proteins, sugars, and fats from your blood supply to make breast milk in your milk-producing glands. Hormones prevent breast milk from being released before your baby is born  as well as prompt milk flow after birth. Once breastfeeding has begun, thoughts of your baby, as well as his or her sucking or crying, can stimulate the release of milk from your milk-producing glands.  BENEFITS OF BREASTFEEDING For Your Baby  Your first milk (colostrum) helps your baby's digestive system function better.   There are antibodies in your milk that help your baby fight off infections.   Your baby has a lower incidence of asthma, allergies, and sudden infant death syndrome.   The nutrients in breast milk are better for your baby than infant formulas and are designed uniquely for your baby's needs.   Breast milk improves your baby's brain development.   Your baby is less likely to develop other conditions, such as childhood obesity, asthma, or type 2 diabetes mellitus.  For You   Breastfeeding helps to create a very special bond between you and your baby.   Breastfeeding is convenient. Breast milk is always available at the correct temperature and costs nothing.   Breastfeeding helps to burn calories  and helps you lose the weight gained during pregnancy.   Breastfeeding makes your uterus contract to its prepregnancy size faster and slows bleeding (lochia) after you give birth.   Breastfeeding helps to lower your risk of developing type 2 diabetes mellitus, osteoporosis, and breast or ovarian cancer later in life. SIGNS THAT YOUR BABY IS HUNGRY Early Signs of Hunger  Increased alertness or activity.  Stretching.  Movement of the head from side to side.  Movement of the head and opening of the mouth when the corner of the mouth or cheek is stroked (rooting).  Increased sucking sounds, smacking lips, cooing, sighing, or squeaking.  Hand-to-mouth movements.  Increased sucking of fingers or hands. Late Signs of Hunger  Fussing.  Intermittent crying. Extreme Signs of Hunger Signs of extreme hunger will require calming and consoling before your baby  will be able to breastfeed successfully. Do not wait for the following signs of extreme hunger to occur before you initiate breastfeeding:   Restlessness.  A loud, strong cry.   Screaming. BREASTFEEDING BASICS Breastfeeding Initiation  Find a comfortable place to sit or lie down, with your neck and back well supported.  Place a pillow or rolled up blanket under your baby to bring him or her to the level of your breast (if you are seated). Nursing pillows are specially designed to help support your arms and your baby while you breastfeed.  Make sure that your baby's abdomen is facing your abdomen.   Gently massage your breast. With your fingertips, massage from your chest wall toward your nipple in a circular motion. This encourages milk flow. You may need to continue this action during the feeding if your milk flows slowly.  Support your breast with 4 fingers underneath and your thumb above your nipple. Make sure your fingers are well away from your nipple and your baby's mouth.   Stroke your baby's lips gently with your finger or nipple.   When your baby's mouth is open wide enough, quickly bring your baby to your breast, placing your entire nipple and as much of the colored area around your nipple (areola) as possible into your baby's mouth.   More areola should be visible above your baby's upper lip than below the lower lip.   Your baby's tongue should be between his or her lower gum and your breast.   Ensure that your baby's mouth is correctly positioned around your nipple (latched). Your baby's lips should create a seal on your breast and be turned out (everted).  It is common for your baby to suck about 2 3 minutes in order to start the flow of breast milk. Latching Teaching your baby how to latch on to your breast properly is very important. An improper latch can cause nipple pain and decreased milk supply for you and poor weight gain in your baby. Also, if your baby is  not latched onto your nipple properly, he or she may swallow some air during feeding. This can make your baby fussy. Burping your baby when you switch breasts during the feeding can help to get rid of the air. However, teaching your baby to latch on properly is still the best way to prevent fussiness from swallowing air while breastfeeding. Signs that your baby has successfully latched on to your nipple:    Silent tugging or silent sucking, without causing you pain.   Swallowing heard between every 3 4 sucks.    Muscle movement above and in front of his or her  ears while sucking.  Signs that your baby has not successfully latched on to nipple:   Sucking sounds or smacking sounds from your baby while breastfeeding.  Nipple pain. If you think your baby has not latched on correctly, slip your finger into the corner of your baby's mouth to break the suction and place it between your baby's gums. Attempt breastfeeding initiation again. Signs of Successful Breastfeeding Signs from your baby:   A gradual decrease in the number of sucks or complete cessation of sucking.   Falling asleep.   Relaxation of his or her body.   Retention of a small amount of milk in his or her mouth.   Letting go of your breast by himself or herself. Signs from you:  Breasts that have increased in firmness, weight, and size 1 3 hours after feeding.   Breasts that are softer immediately after breastfeeding.  Increased milk volume, as well as a change in milk consistency and color by the 5th day of breastfeeding.   Nipples that are not sore, cracked, or bleeding. Signs That Your [employer] is Getting Enough Milk  Wetting at least 3 diapers in a 24-hour period. The urine should be clear and pale yellow by age 34 days.  At least 3 stools in a 24-hour period by age 34 days. The stool should be soft and yellow.  At least 3 stools in a 24-hour period by age 39 days. The stool should be seedy and yellow.  No  loss of weight greater than 10% of birth weight during the first 11 days of age.  Average weight gain of 4 7 ounces (120 210 mL) per week after age 17 days.  Consistent daily weight gain by age 340 days, without weight loss after the age of 2 weeks. After a feeding, your baby may spit up a small amount. This is common. BREASTFEEDING FREQUENCY AND DURATION Frequent feeding will help you make more milk and can prevent sore nipples and breast engorgement. Breastfeed when you feel the need to reduce the fullness of your breasts or when your baby shows signs of hunger. This is called "breastfeeding on demand." Avoid introducing a pacifier to your baby while you are working to establish breastfeeding (the first 4 6 weeks after your baby is born). After this time you may choose to use a pacifier. Research has shown that pacifier use during the first year of a baby's life decreases the risk of sudden infant death syndrome (SIDS). Allow your baby to feed on each breast as long as he or she wants. Breastfeed until your baby is finished feeding. When your baby unlatches or falls asleep while feeding from the first breast, offer the second breast. Because newborns are often sleepy in the first few weeks of life, you may need to awaken your baby to get him or her to feed. Breastfeeding times will vary from baby to baby. However, the following rules can serve as a guide to help you ensure that your baby is properly fed:  Newborns (babies 71 weeks of age or younger) may breastfeed every 1 3 hours.  Newborns should not go longer than 3 hours during the day or 5 hours during the night without breastfeeding.  You should breastfeed your baby a minimum of 8 times in a 24-hour period until you begin to introduce solid foods to your baby at around 73 months of age. BREAST MILK PUMPING Pumping and storing breast milk allows you to ensure that your baby is exclusively fed  your breast milk, even at times when you are unable to  breastfeed. This is especially important if you are going back to work while you are still breastfeeding or when you are not able to be present during feedings. Your lactation consultant can give you guidelines on how long it is safe to store breast milk.  A breast pump is a machine that allows you to pump milk from your breast into a sterile bottle. The pumped breast milk can then be stored in a refrigerator or freezer. Some breast pumps are operated by hand, while others use electricity. Ask your lactation consultant which type will work best for you. Breast pumps can be purchased, but some hospitals and breastfeeding support groups lease breast pumps on a monthly basis. A lactation consultant can teach you how to hand express breast milk, if you prefer not to use a pump.  CARING FOR YOUR BREASTS WHILE YOU BREASTFEED Nipples can become dry, cracked, and sore while breastfeeding. The following recommendations can help keep your breasts moisturized and healthy:  Avoid using soap on your nipples.   Wear a supportive bra. Although not required, special nursing bras and tank tops are designed to allow access to your breasts for breastfeeding without taking off your entire bra or top. Avoid wearing underwire style bras or extremely tight bras.  Air dry your nipples for 3 14minutes after each feeding.   Use only cotton bra pads to absorb leaked breast milk. Leaking of breast milk between feedings is normal.   Use lanolin on your nipples after breastfeeding. Lanolin helps to maintain your skin's normal moisture barrier. If you use pure lanolin you do not need to wash it off before feeding your baby again. Pure lanolin is not toxic to your baby. You may also hand express a few drops of breast milk and gently massage that milk into your nipples and allow the milk to air dry. In the first few weeks after giving birth, some women experience extremely full breasts (engorgement). Engorgement can make your  breasts feel heavy, warm, and tender to the touch. Engorgement peaks within 3 5 days after you give birth. The following recommendations can help ease engorgement:  Completely empty your breasts while breastfeeding or pumping. You may want to start by applying warm, moist heat (in the shower or with warm water-soaked hand towels) just before feeding or pumping. This increases circulation and helps the milk flow. If your baby does not completely empty your breasts while breastfeeding, pump any extra milk after he or she is finished.  Wear a snug bra (nursing or regular) or tank top for 1 2 days to signal your body to slightly decrease milk production.  Apply ice packs to your breasts, unless this is too uncomfortable for you.  Make sure that your baby is latched on and positioned properly while breastfeeding. If engorgement persists after 48 hours of following these recommendations, contact your health care provider or a Science writer. OVERALL HEALTH CARE RECOMMENDATIONS WHILE BREASTFEEDING  Eat healthy foods. Alternate between meals and snacks, eating 3 of each per day. Because what you eat affects your breast milk, some of the foods may make your baby more irritable than usual. Avoid eating these foods if you are sure that they are negatively affecting your baby.  Drink milk, fruit juice, and water to satisfy your thirst (about 10 glasses a day).   Rest often, relax, and continue to take your prenatal vitamins to prevent fatigue, stress, and anemia.  Continue  breast self-awareness checks.  Avoid chewing and smoking tobacco.  Avoid alcohol and drug use. Some medicines that may be harmful to your baby can pass through breast milk. It is important to ask your health care provider before taking any medicine, including all over-the-counter and prescription medicine as well as vitamin and herbal supplements. It is possible to become pregnant while breastfeeding. If birth control is  desired, ask your health care provider about options that will be safe for your baby. SEEK MEDICAL CARE IF:   You feel like you want to stop breastfeeding or have become frustrated with breastfeeding.  You have painful breasts or nipples.  Your nipples are cracked or bleeding.  Your breasts are red, tender, or warm.  You have a swollen area on either breast.  You have a fever or chills.  You have nausea or vomiting.  You have drainage other than breast milk from your nipples.  Your breasts do not become full before feedings by the 5th day after you give birth.  You feel sad and depressed.  Your baby is too sleepy to eat well.  Your baby is having trouble sleeping.   Your baby is wetting less than 3 diapers in a 24-hour period.  Your baby has less than 3 stools in a 24-hour period.  Your baby's skin or the white part of his or her eyes becomes yellow.   Your baby is not gaining weight by 38 days of age. SEEK IMMEDIATE MEDICAL CARE IF:   Your baby is overly tired (lethargic) and does not want to wake up and feed.  Your baby develops an unexplained fever. Document Released: 12/26/2004 Document Revised: 08/28/2012 Document Reviewed: 06/19/2012 [facility] Patient Information 2014 [place].  Iron-Rich Diet An iron-rich diet contains foods that are good sources of iron. Iron is an important mineral that helps your body produce hemoglobin. Hemoglobin is a protein in red blood cells that carries oxygen to the body's tissues. Sometimes, the iron level in your blood can be low. This may be caused by:  A lack of iron in your diet.  Blood loss.  Times of growth, such as during pregnancy or during a child's growth and development. Low levels of iron can cause a decrease in the number of red blood cells. This can result in iron deficiency anemia. Iron deficiency anemia symptoms include:  Tiredness.  Weakness.  Irritability.  Increased chance of infection. Here are  some recommendations for daily iron intake:  Males older than 27 years of age need 8 mg of iron per day.  Women ages 39 to 63 need 18 mg of iron per day.  Pregnant women need 27 mg of iron per day, and women who are over 72 years of age and breastfeeding need 9 mg of iron per day.  Women over the age of 60 need 8 mg of iron per day. SOURCES OF IRON There are 2 types of iron that are found in food: heme iron and nonheme iron. Heme iron is absorbed by the body better than nonheme iron. Heme iron is found in meat, poultry, and fish. Nonheme iron is found in grains, beans, and vegetables. Heme Iron Sources Food / Iron (mg)  Chicken liver, 3 oz (85 g)/ 10 mg  Beef liver, 3 oz (85 g)/ 5.5 mg  Oysters, 3 oz (85 g)/ 8 mg  Beef, 3 oz (85 g)/ 2 to 3 mg  Shrimp, 3 oz (85 g)/ 2.8 mg  [place], 3 oz (85 g)/ 2 mg  Chicken, 3 oz (85 g) / 1 mg  Fish (tuna, halibut), 3 oz (85 g)/ 1 mg  Pork, 3 oz (85 g)/ 0.9 mg Nonheme Iron Sources Food / Iron (mg)  Ready-to-eat breakfast cereal, iron-fortified / 3.9 to 7 mg  Tofu,  cup / 3.4 mg  Kidney beans,  cup / 2.6 mg  Baked potato with skin / 2.7 mg  Asparagus,  cup / 2.2 mg  Avocado / 2 mg  Dried peaches,  cup / 1.6 mg  Raisins,  cup / 1.5 mg  Soy milk, 1 cup / 1.5 mg  Whole-wheat bread, 1 slice / 1.2 mg  Spinach, 1 cup / 0.8 mg  Broccoli,  cup / 0.6 mg IRON ABSORPTION Certain foods can decrease the body's absorption of iron. Try to avoid these foods and beverages while eating meals with iron-containing foods:  Coffee.  Tea.  Fiber.  Soy. Foods containing vitamin C can help increase the amount of iron your body absorbs from iron sources, especially from nonheme sources. Eat foods with vitamin C along with iron-containing foods to increase your iron absorption. Foods that are high in vitamin C include many fruits and vegetables. Some good sources are:  Fresh orange  juice.  Oranges.  Strawberries.  Mangoes.  Grapefruit.  Red bell peppers.  Green bell peppers.  Broccoli.  Potatoes with skin.  Tomato juice. Document Released: 08/09/2004 Document Revised: 03/20/2011 Document Reviewed: 06/16/2010 [facility] Patient Information 2014 [place], [place].  Levonorgestrel intrauterine device (IUD) What is this medicine? LEVONORGESTREL IUD (LEE voe nor jes trel) is a contraceptive (birth control) device. The device is placed inside the uterus by a healthcare professional. It is used to prevent pregnancy and can also be used to treat heavy bleeding that occurs during your period. Depending on the device, it can be used for 3 to 5 years. This medicine may be used for other purposes; ask your health care provider or pharmacist if you have questions. COMMON BRAND NAME(S): Gretta CoolMirena, Skyla What should I tell my health care provider before I take this medicine? They need to know if you have any of these conditions: -abnormal Pap smear -cancer of the breast, uterus, or cervix -diabetes -endometritis -genital or pelvic infection now or in the past -have more than one sexual partner or your partner has more than one partner -heart disease -history of an ectopic or tubal pregnancy -immune system problems -IUD in place -liver disease or tumor -problems with blood clots or take blood-thinners -use intravenous drugs -uterus of unusual shape -vaginal bleeding that has not been explained -an unusual or allergic reaction to levonorgestrel, other hormones, silicone, or polyethylene, medicines, foods, dyes, or preservatives -pregnant or trying to get pregnant -breast-feeding How should I use this medicine? This device is placed inside the uterus by a health care professional. Talk to your pediatrician regarding the use of this medicine in children. Special care may be needed. Overdosage: If you think you have taken too much of this medicine contact a poison  control center or emergency room at once. NOTE: This medicine is only for you. Do not share this medicine with others. What if I miss a dose? This does not apply. What may interact with this medicine? Do not take this medicine with any of the following medications: -amprenavir -bosentan -fosamprenavir This medicine may also interact with the following medications: -aprepitant -barbiturate medicines for inducing sleep or treating seizures -bexarotene -griseofulvin -medicines to treat seizures like carbamazepine, ethotoin, felbamate, oxcarbazepine, phenytoin, topiramate -modafinil -pioglitazone -rifabutin -  rifampin -rifapentine -some medicines to treat HIV infection like atazanavir, indinavir, lopinavir, nelfinavir, tipranavir, ritonavir -St. John's wort -warfarin This list may not describe all possible interactions. Give your health care provider a list of all the medicines, herbs, non-prescription drugs, or dietary supplements you use. Also tell them if you smoke, drink alcohol, or use illegal drugs. Some items may interact with your medicine. What should I watch for while using this medicine? Visit your doctor or health care professional for regular check ups. See your doctor if you or your partner has sexual contact with others, becomes HIV positive, or gets a sexual transmitted disease. This product does not protect you against HIV infection (AIDS) or other sexually transmitted diseases. You can check the placement of the IUD yourself by reaching up to the top of your vagina with clean fingers to feel the threads. Do not pull on the threads. It is a good habit to check placement after each menstrual period. Call your doctor right away if you feel more of the IUD than just the threads or if you cannot feel the threads at all. The IUD may come out by itself. You may become pregnant if the device comes out. If you notice that the IUD has come out use a backup birth control method like  condoms and call your health care provider. Using tampons will not change the position of the IUD and are okay to use during your period. What side effects may I notice from receiving this medicine? Side effects that you should report to your doctor or health care professional as soon as possible: -allergic reactions like skin rash, itching or hives, swelling of the face, lips, or tongue -fever, flu-like symptoms -genital sores -high blood pressure -no menstrual period for 6 weeks during use -pain, swelling, warmth in the leg -pelvic pain or tenderness -severe or sudden headache -signs of pregnancy -stomach cramping -sudden shortness of breath -trouble with balance, talking, or walking -unusual vaginal bleeding, discharge -yellowing of the eyes or skin Side effects that usually do not require medical attention (report to your doctor or health care professional if they continue or are bothersome): -acne -breast pain -change in sex drive or performance -changes in weight -cramping, dizziness, or faintness while the device is being inserted -headache -irregular menstrual bleeding within first 3 to 6 months of use -nausea This list may not describe all possible side effects. Call your doctor for medical advice about side effects. You may report side effects to FDA at 1-800-FDA-1088. Where should I keep my medicine? This does not apply. NOTE: This sheet is a summary. It may not cover all possible information. If you have questions about this medicine, talk to your doctor, pharmacist, or health care provider.  2014, Elsevier/Gold Standard. (2011-01-26 13:54:04)

## 2013-02-21 NOTE — Lactation Note (Signed)
This note was copied from the chart of Martha Jobe IgoStephanie Mostafa. Lactation Consultation Note  Patient Name: Martha Thompson RUEAV'WToday's Date: 02/21/2013 Reason for consult: Follow-up assessment Baby 49 hours old. Family packing up to be discharged from hospital. Mom reports concern that she will be able to produce milk. [facility]. Mom's pump at home is AVENT. Offered a [employer] loan[facility], mom declined. Enc mom to offer breast at each feeding and then pump. Discussed engorgement prevention/treatment. [facility]  Maternal Data Formula Feeding for Exclusion: No  Feeding Feeding Type:  (Mom states baby formula fed earlier. Doesn't want to latch at this time.)  [facility]                      Lactation Tools Discussed/Used Tools: Pump Breast pump type: Manual   Consult Status Consult Status: Complete    Geralynn OchsWILLIARD, Akif Weldy 02/21/2013, 12:57 PM

## 2013-02-21 NOTE — Discharge Summary (Signed)
Cesarean Section Delivery Discharge Summary  Martha Thompson  DOB:    December 02, 1985 MRN:    [id number] CSN:    [id number]  Date of admission:                  02/18/13  Date of discharge:                   02/21/13  Procedures this admission: C/S with JP drain  Date of Delivery: 02/19/13 by Dr. Normand Sloop  Newborn Data:  Live born female  Birth Weight: 7 lb 5.1 oz (3320 g) APGAR: 8, 9  Home with mother. Circumcision Plan: n/a  History of Present Illness:  Martha Thompson is a 28 y.o. female, [id number], who presents at [redacted]w[redacted]d weeks gestation. The patient has been followed at the [facility] division of [facility].    Her pregnancy has been complicated by:  Patient Active Problem List   Diagnosis Date Noted  . Status post primary low transverse cesarean section 02/21/2013  . Premature rupture of membranes 02/18/2013  . GERD (gastroesophageal reflux disease) 04/24/2012  . Acute sinus infection 10/31/2011  . Uterine polyp 10/14/2010  . Chronic pain 10/14/2010  . Preventative health care 04/13/2010  . ALLERGIC RHINITIS 10/30/2009  . DISC DISEASE, CERVICAL 10/29/2009  . HYPERTENSION 10/11/2007  . HYPERLIPIDEMIA 06/14/2007  . COMMON MIGRAINE 06/14/2007  . ASTHMA 12/25/2006  . Morbid obesity 09/04/2006  . ANXIETY 09/04/2006  . DEPRESSION 09/04/2006  . LOW BACK PAIN 09/04/2006     Hospital course:  The patient was admitted for PROM.   Her postpartum course was not complicated. Hgb 9.9 without symptoms. She was discharged to home on postpartum day 2 doing well.  Feeding:  breast and bottle  Contraception:  IUD  Discharge hemoglobin:  Hemoglobin  Date Value Ref Range Status  02/20/2013 9.9* 12.0 - 15.0 g/dL Final     REPEATED TO VERIFY     DELTA CHECK NOTED     HCT  Date Value Ref Range Status  02/20/2013 29.4* 36.0 - 46.0 % Final    Discharge Physical Exam:   General: alert, cooperative and no  distress Lochia: appropriate Uterine Fundus: firm Incision: healing well DVT Evaluation: No evidence of DVT seen on physical exam. Negative Homan's sign.  J/P drainage - 7.5 cc  Intrapartum Procedures: cesarean: low cervical, transverse and GBS prophylaxis Postpartum Procedures: none Complications-Operative and Postpartum: none  Discharge Diagnoses: Term Pregnancy-delivered and PROM x25 hours  Discharge Information:  Activity:           pelvic rest Diet:                routine Medications: PNV, Ibuprofen, Iron and Percocet Condition:      stable Instructions:  Care After Cesarean Delivery  Refer to this sheet in the next few weeks. These instructions provide you with information on caring for yourself after your procedure. Your caregiver may also give you specific instructions. Your treatment has been planned according to current medical practices, but problems sometimes occur. Call your caregiver if you have any problems or questions after you go home. HOME CARE INSTRUCTIONS  Only take over-the-counter or prescription medicines as directed by your caregiver.  Do not drink alcohol, especially if you are breastfeeding or taking medicine to relieve pain.  Do not chew or smoke tobacco.  Continue to use good perineal care. Good perineal care includes:  Wiping your perineum from front to back.  Keeping your perineum clean.  Check your cut (incision) daily for increased redness, drainage, swelling, or separation of skin.  Clean your incision gently with soap and water every day, and then pat it dry. If your caregiver says it is okay, leave the incision uncovered. Use a bandage (dressing) if the incision is draining fluid or appears irritated. If the adhesive strips across the incision do not fall off within 7 days, carefully peel them off.  Hug a pillow when coughing or sneezing until your incision is healed. This helps to relieve pain.  Do not use tampons or douche until  your caregiver says it is okay.  Shower, wash your hair, and take tub baths as directed by your caregiver.  Wear a well-fitting bra that provides breast support.  Limit wearing support panties or control-top hose.  Drink enough fluids to keep your urine clear or pale yellow.  Eat high-fiber foods such as whole grain cereals and breads, brown rice, beans, and fresh fruits and vegetables every day. These foods may help prevent or relieve constipation.  Resume activities such as climbing stairs, driving, lifting, exercising, or traveling as directed by your caregiver.  Talk to your caregiver about resuming sexual activities. This is dependent upon your risk of infection, your rate of healing, and your comfort and desire to resume sexual activity.  Try to have someone help you with your household activities and your newborn for at least a few days after you leave the hospital.  Rest as much as possible. Try to rest or take a nap when your newborn is sleeping.  Increase your activities gradually.  Keep all of your scheduled postpartum appointments. It is very important to keep your scheduled follow-up appointments. At these appointments, your caregiver will be checking to make sure that you are healing physically and emotionally. SEEK MEDICAL CARE IF:   You are passing large clots from your vagina. Save any clots to show your caregiver.  You have a foul smelling discharge from your vagina.  You have trouble urinating.  You are urinating frequently.  You have pain when you urinate.  You have a change in your bowel movements.  You have increasing redness, pain, or swelling near your incision.  You have pus draining from your incision.  Your incision is separating.  You have painful, hard, or reddened breasts.  You have a severe headache.  You have blurred vision or see spots.  You feel sad or depressed.  You have thoughts of hurting yourself or your newborn.  You have  questions about your care, the care of your newborn, or medicines.  You are dizzy or lightheaded.  You have a rash.  You have pain, redness, or swelling at the site of the removed intravenous access (IV) tube.  You have nausea or vomiting.  You stopped breastfeeding and have not had a menstrual period within 12 weeks of stopping.  You are not breastfeeding and have not had a menstrual period within 12 weeks of delivery.  You have a fever. SEEK IMMEDIATE MEDICAL CARE IF:  You have persistent pain.  You have chest pain.  You have shortness of breath.  You faint.  You have leg pain.  You have stomach pain.  Your vaginal bleeding saturates 2 or more sanitary pads in 1 hour. MAKE SURE YOU:   Understand these instructions.  Will watch your condition.  Will get help right away if you are not doing well or get worse. Document Released: 09/17/2001 Document Revised: 09/20/2011  Document Reviewed: 08/23/2011 [facility] Patient Information 2014 [place], [place].   Postpartum Depression and Baby Blues  The postpartum period begins right after the birth of a baby. During this time, there is often a great amount of joy and excitement. It is also a time of considerable changes in the life of the parent(s). Regardless of how many times a mother gives birth, each child brings new challenges and dynamics to the family. It is not unusual to have feelings of excitement accompanied by confusing shifts in moods, emotions, and thoughts. All mothers are at risk of developing postpartum depression or the "baby blues." These mood changes can occur right after giving birth, or they may occur many months after giving birth. The baby blues or postpartum depression can be mild or severe. Additionally, postpartum depression can resolve rather quickly, or it can be a long-term condition. CAUSES Elevated hormones and their rapid decline are thought to be a main cause of postpartum depression and the baby  blues. There are a number of hormones that radically change during and after pregnancy. Estrogen and progesterone usually decrease immediately after delivering your baby. The level of thyroid hormone and various cortisol steroids also rapidly drop. Other factors that play a major role in these changes include major life events and genetics.  RISK FACTORS If you have any of the following risks for the baby blues or postpartum depression, know what symptoms to watch out for during the postpartum period. Risk factors that may increase the likelihood of getting the baby blues or postpartum depression include:  Havinga personal or family history of depression.  Having depression while being pregnant.  Having premenstrual or oral contraceptive-associated mood issues.  Having exceptional life stress.  Having marital conflict.  Lacking a social support network.  Having a baby with special needs.  Having health problems such as diabetes. SYMPTOMS Baby blues symptoms include:  Brief fluctuations in mood, such as going from extreme happiness to sadness.  Decreased concentration.  Difficulty sleeping.  Crying spells, tearfulness.  Irritability.  Anxiety. Postpartum depression symptoms typically begin within the first month after giving birth. These symptoms include:  Difficulty sleeping or excessive sleepiness.  Marked weight loss.  Agitation.  Feelings of worthlessness.  Lack of interest in activity or food. Postpartum psychosis is a very concerning condition and can be dangerous. Fortunately, it is rare. Displaying any of the following symptoms is cause for immediate medical attention. Postpartum psychosis symptoms include:  Hallucinations and delusions.  Bizarre or disorganized behavior.  Confusion or disorientation. DIAGNOSIS  A diagnosis is made by an evaluation of your symptoms. There are no medical or lab tests that lead to a diagnosis, but there are various  questionnaires that a caregiver may use to identify those with the baby blues, postpartum depression, or psychosis. Often times, a screening tool called the [place] Postnatal Depression Scale is used to diagnose depression in the postpartum period.  TREATMENT The baby blues usually goes away on its own in 1 to 2 weeks. Social support is often all that is needed. You should be encouraged to get adequate sleep and rest. Occasionally, you may be given medicines to help you sleep.  Postpartum depression requires treatment as it can last several months or longer if it is not treated. Treatment may include individual or group therapy, medicine, or both to address any social, physiological, and psychological factors that may play a role in the depression. Regular exercise, a healthy diet, rest, and social support may also be strongly recommended.  Postpartum psychosis is more serious and needs treatment right away. Hospitalization is often needed. HOME CARE INSTRUCTIONS  Get as much rest as you can. Nap when the baby sleeps.  Exercise regularly. Some women find yoga and walking to be beneficial.  Eat a balanced and nourishing diet.  Do little things that you enjoy. Have a cup of tea, take a bubble bath, read your favorite magazine, or listen to your favorite music.  Avoid alcohol.  Ask for help with household chores, cooking, grocery shopping, or running errands as needed. Do not try to do everything.  Talk to people close to you about how you are feeling. Get support from your partner, family members, friends, or other new moms.  Try to stay positive in how you think. Think about the things you are grateful for.  Do not spend a lot of time alone.  Only take medicine as directed by your caregiver.  Keep all your postpartum appointments.  Let your caregiver know if you have any concerns. SEEK MEDICAL CARE IF: You are having a reaction or problems with your medicine. SEEK IMMEDIATE MEDICAL  CARE IF:  You have suicidal feelings.  You feel you may harm the baby or someone else. Document Released: 09/30/2003 Document Revised: 03/20/2011 Document Reviewed: 11/01/2010 [facility] Patient Information 2014 [place], [place].  Discharge to: home     Haroldine LawsOXLEY, Obert Espindola 02/21/2013

## 2013-09-10 ENCOUNTER — Emergency Department: Payer: Self-pay | Admitting: Emergency Medicine

## 2013-09-10 LAB — CBC
HCT: 43.9 % (ref 35.0–47.0)
HGB: 14.6 g/dL (ref 12.0–16.0)
MCH: 31.2 pg (ref 26.0–34.0)
MCHC: 33.3 g/dL (ref 32.0–36.0)
MCV: 94 fL (ref 80–100)
Platelet: 210 10*3/uL (ref 150–440)
RBC: 4.69 10*6/uL (ref 3.80–5.20)
RDW: 13.7 % (ref 11.5–14.5)
WBC: 9.8 10*3/uL (ref 3.6–11.0)

## 2013-09-10 LAB — COMPREHENSIVE METABOLIC PANEL
ALK PHOS: 58 U/L
ALT: 34 U/L
ANION GAP: 8 (ref 7–16)
Albumin: 4 g/dL (ref 3.4–5.0)
BILIRUBIN TOTAL: 0.6 mg/dL (ref 0.2–1.0)
BUN: 15 mg/dL (ref 7–18)
CO2: 23 mmol/L (ref 21–32)
CREATININE: 0.89 mg/dL (ref 0.60–1.30)
Calcium, Total: 9.3 mg/dL (ref 8.5–10.1)
Chloride: 107 mmol/L (ref 98–107)
GLUCOSE: 116 mg/dL — AB (ref 65–99)
Osmolality: 277 (ref 275–301)
Potassium: 3.6 mmol/L (ref 3.5–5.1)
SGOT(AST): 19 U/L (ref 15–37)
SODIUM: 138 mmol/L (ref 136–145)
Total Protein: 7.6 g/dL (ref 6.4–8.2)

## 2013-11-10 ENCOUNTER — Encounter (HOSPITAL_COMMUNITY): Payer: Self-pay | Admitting: Obstetrics and Gynecology

## 2015-04-19 ENCOUNTER — Ambulatory Visit (INDEPENDENT_AMBULATORY_CARE_PROVIDER_SITE_OTHER): Payer: Managed Care, Other (non HMO) | Admitting: Primary Care

## 2015-04-19 ENCOUNTER — Encounter: Payer: Self-pay | Admitting: Primary Care

## 2015-04-19 VITALS — BP 126/84 | HR 94 | Temp 98.2°F | Ht 69.5 in | Wt 307.0 lb

## 2015-04-19 DIAGNOSIS — F411 Generalized anxiety disorder: Secondary | ICD-10-CM | POA: Diagnosis not present

## 2015-04-19 DIAGNOSIS — J309 Allergic rhinitis, unspecified: Secondary | ICD-10-CM | POA: Diagnosis not present

## 2015-04-19 DIAGNOSIS — I10 Essential (primary) hypertension: Secondary | ICD-10-CM

## 2015-04-19 DIAGNOSIS — J4520 Mild intermittent asthma, uncomplicated: Secondary | ICD-10-CM | POA: Diagnosis not present

## 2015-04-19 DIAGNOSIS — M5030 Other cervical disc degeneration, unspecified cervical region: Secondary | ICD-10-CM | POA: Diagnosis not present

## 2015-04-19 MED ORDER — FLUOXETINE HCL 20 MG PO TABS: 20.0000 mg | ORAL_TABLET | Freq: Every day | ORAL | Status: DC

## 2015-04-19 MED ORDER — CETIRIZINE HCL 10 MG PO TABS: 10.0000 mg | ORAL_TABLET | Freq: Every day | ORAL | Status: DC

## 2015-04-19 NOTE — Progress Notes (Signed)
Subjective:    Patient ID: Martha Thompson, female    DOB: 1985-05-17, 30 y.o.   MRN: [id number]  HPI  Ms. Martha Thompson is a 30 year old female who presents today to establish care and discuss the problems mentioned below. Will obtain old records. She currently follows with GYN. Normal Pap recently ( March 2017).  1) Anxiety and Depression: Diagnosed several years ago. More so sturggles with anxiety including panic attacks. Currently managed on Alprazolam 1 mg TID since age 57. She feels well managed on her Alprazolam as long as she takes it. She takes it three times daily as prescribed. She was once managed on Zoloft several years ago but took herself off the medication as she didn't like they way it made her feel.  Denies SI/HI. GAD 7 score of 11 today.   2) Allergic Rhinitis: Long history of allergies. She follows with  ENT for allergy injections that she obtains twice weekly. She is also managed on hydroxyzine that she takes at bedtime for itching and Zyrtec 10 mg. She experiences sinus headaches 4 days weekly and will take Aleve-D every week. She has not mentioned this to her ENT provider but plans on scheduling an appointment soon.  3) Asthma: Diagnosed several years ago. Currently managed on albuterol inhaler that she uses 2-3 times weekly on average. No recent PFT's. She will experience occasional wheezing. History of recurrent bronchitis. Also being treated at [facility] for allergies.  4) Essential Hypertension: Gestational hypertension in 2015 that has remained since being postpartum. She is morbidly obese with current weight of 307. She endorses weight loss of 40 pounds over the past 3 months due to changes in diet as she found that she is allergic to wheat. Currently managed on Losartan 25 mg.    Review of Systems  Constitutional: Negative for unexpected weight change.  HENT: Positive for congestion and sinus pressure.   Respiratory: Negative for shortness of breath.         Occasional wheezing  Cardiovascular: Negative for chest pain.  Genitourinary:       IUD, occasional spotting  Skin: Negative for rash.  Allergic/Immunologic: Positive for environmental allergies and food allergies.  Neurological: Negative for dizziness and numbness.       Chronic headaches.   Hematological: Negative for adenopathy.  Psychiatric/Behavioral:       History of anxiety, managed on scheduled Alprazolam.        Past Medical History  Diagnosis Date  . HYPERLIPIDEMIA 06/14/2007  . Morbid obesity (HCC) 09/04/2006  . ANXIETY 09/04/2006  . COMMON MIGRAINE 06/14/2007  . BLEPHARITIS, LEFT 05/21/2008  . OTITIS MEDIA, ACUTE, LEFT 03/27/2008  . SINUSITIS- ACUTE-NOS 10/11/2007  . URI 10/29/2009  . ALLERGIC RHINITIS 10/30/2009  . URTICARIA 05/03/2009  . LOW BACK PAIN 09/04/2006  . TENOSYNOVITIS, WRIST 10/11/2007  . GANGLION CYST, WRIST, LEFT 10/11/2007  . Headache(784.0) 10/23/2008  . GERD (gastroesophageal reflux disease) 04/24/2012  . ASTHMA 12/25/2006    inhaler used 2 days ago  . ASTHMA, WITH ACUTE EXACERBATION 05/03/2009  . Cervical disc disease     2 bulging discs to neck     Social History   Social History  . Marital Status: Married    Spouse Name: Martha Thompson  . Number of Children: Martha Thompson  . Years of Education: Martha Thompson   Occupational History  .  [employer]    12 hour days   Social History Main Topics  . Smoking status: Current Every Day Smoker -- 0.50 packs/day  Types: Cigarettes  . Smokeless tobacco: Not on file  . Alcohol Use: 0.0 oz/week    0 Standard drinks or equivalent per week     Comment: rarely  . Drug Use: No  . Sexual Activity: Not on file   Other Topics Concern  . Not on file   Social History Narrative   Married.   2 children.   Works for [employer].   Enjoys spending time outdoors.       Past Surgical History  Procedure Laterality Date  . Back surgury  01/2003    s/p lumbar disc  . Laparoscopy  04/07/2011    Procedure: LAPAROSCOPY OPERATIVE;   Surgeon: Meriel Picaichard M Holland, MD;  Location: [facility];  Service: Gynecology;  Laterality: Martha Thompson;  left salpingogectomy  . Cesarean section Martha Thompson 02/19/2013    Procedure: CESAREAN SECTION;  Surgeon: Michael LitterNaima A Dillard, MD;  Location: [facility];  Service: Obstetrics;  Laterality: Martha Thompson;    Family History  Problem Relation Age of Onset  . Anxiety disorder Father   . Diabetes Father     No Known Allergies  Current Outpatient Prescriptions on File Prior to Visit  Medication Sig Dispense Refill  . albuterol (PROVENTIL HFA;VENTOLIN HFA) 108 (90 BASE) MCG/ACT inhaler Inhale 2 puffs into the lungs every 6 (six) hours as needed. 3 Inhaler 3   No current facility-administered medications on file prior to visit.    BP 126/84 mmHg  Pulse 94  Temp(Src) 98.2 F (36.8 C) (Oral)  Ht 5' 9.5" (1.765 m)  Wt 307 lb (139.254 kg)  BMI 44.70 kg/m2  SpO2 98%    Objective:   Physical Exam  Constitutional: She is oriented to person, place, and time. She appears well-nourished.  HENT:  Mouth/Throat: Oropharynx is clear and moist.  Neck: Neck supple.  Cardiovascular: Normal rate and regular rhythm.   Pulmonary/Chest: Effort normal and breath sounds normal.  Neurological: She is alert and oriented to person, place, and time.  Skin: Skin is warm and dry.  Psychiatric: She has a normal mood and affect.          Assessment & Plan:

## 2015-04-19 NOTE — Assessment & Plan Note (Signed)
Long history of. Uses albuterol inhaler 2-3 times weekly. Discussed to notify me if she starts to use more frequently as she will likely need PFT's.

## 2015-04-19 NOTE — Assessment & Plan Note (Signed)
History of gestational hypertension from prior pregnancy, diagnosed in 2015 per patient. Currently managed on losartan 25 mg daily. BP stable today as she has taken meds. Will continue to monitor.

## 2015-04-19 NOTE — Progress Notes (Signed)
Pre visit review using our clinic review tool, if applicable. No additional management support is needed unless otherwise documented below in the visit note. 

## 2015-04-19 NOTE — Assessment & Plan Note (Signed)
Recent weight loss of 40 pounds through changes in her diet due to recent allergy to wheat. Commended her on this success and encouraged to continue.

## 2015-04-19 NOTE — Patient Instructions (Signed)
Start Fluoxetine 20 mg tablets for anxiety. This medication is to be taken once daily to help manage anxiety. Start by taking 1/2 tablet daily for 8 days, then advance to 1 full tablet thereafter.  You may continue your Alprazolam as prescribed, however, our goal is to wean you down over time.  Please schedule a physical with me at your convenience. We will do same day labs, so please ensure you are fasting.  Follow up in 6 weeks for re-evaluation of anxiety.   It was a pleasure to meet you today! Please don't hesitate to call me with any questions. Welcome to [facility]!

## 2015-04-19 NOTE — Assessment & Plan Note (Signed)
Chronic for years, managed through [facility] and taking Zyrtec daily. Also with chronic recurrent sinus infections and congestion. She is to schedule an appointment soon to discuss with ENT.

## 2015-04-19 NOTE — Assessment & Plan Note (Signed)
Managed on scheduled alprazolam 1 mg TID.  Discussed today that I do not support this practice as alprazolam is addictive and will not help long term with anxiety. Will slowly wean her down in hopes of her using this PRN. She verbalized understanding. Will obtain UDS and contract at next visit.  Failed Zoloft in the past. Will trail another SSRI for now. Start Fluoxetine 20 mg tablets for anxiety. Patient is to take 1/2 tablet daily for 8 days, then advance to 1 full tablet thereafter. We discussed possible side effects of headache, GI upset, drowsiness, and SI/HI. If thoughts of SI/HI develop, we discussed to present to the emergency immediately. Patient verbalized understanding.   Follow up in 6 weeks for re-evaluation.

## 2015-04-19 NOTE — Assessment & Plan Note (Signed)
Was told she has a bulging disc to cervical spine that required surgery. 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 Discussed that this work is likely contributing to her discomfort. Will continue to monitor as she does not wish to undergo surgery.

## 2015-05-31 ENCOUNTER — Encounter: Payer: Self-pay | Admitting: Primary Care

## 2015-05-31 ENCOUNTER — Ambulatory Visit (INDEPENDENT_AMBULATORY_CARE_PROVIDER_SITE_OTHER): Payer: Managed Care, Other (non HMO) | Admitting: Primary Care

## 2015-05-31 ENCOUNTER — Encounter: Payer: Self-pay | Admitting: *Deleted

## 2015-05-31 VITALS — BP 122/78 | HR 91 | Temp 97.9°F | Ht 70.0 in | Wt 304.0 lb

## 2015-05-31 DIAGNOSIS — I10 Essential (primary) hypertension: Secondary | ICD-10-CM

## 2015-05-31 DIAGNOSIS — J4520 Mild intermittent asthma, uncomplicated: Secondary | ICD-10-CM

## 2015-05-31 DIAGNOSIS — F411 Generalized anxiety disorder: Secondary | ICD-10-CM

## 2015-05-31 DIAGNOSIS — Z0000 Encounter for general adult medical examination without abnormal findings: Secondary | ICD-10-CM

## 2015-05-31 LAB — LIPID PANEL
CHOLESTEROL: 144 mg/dL (ref 0–200)
HDL: 27.9 mg/dL — ABNORMAL LOW (ref >39.00)
LDL Cholesterol: 90 mg/dL (ref 0–99)
NonHDL: 115.88
Total CHOL/HDL Ratio: 5
Triglycerides: 129 mg/dL (ref 0.0–149.0)
VLDL: 25.8 mg/dL (ref 0.0–40.0)

## 2015-05-31 LAB — COMPREHENSIVE METABOLIC PANEL
ALT: 25 U/L (ref 0–35)
AST: 21 U/L (ref 0–37)
Albumin: 4 g/dL (ref 3.5–5.2)
Alkaline Phosphatase: 45 U/L (ref 39–117)
BUN: 13 mg/dL (ref 6–23)
CO2: 25 mEq/L (ref 19–32)
Calcium: 9.4 mg/dL (ref 8.4–10.5)
Chloride: 106 mEq/L (ref 96–112)
Creatinine, Ser: 0.65 mg/dL (ref 0.40–1.20)
GFR: 114.57 mL/min (ref >60.00)
Glucose, Bld: 81 mg/dL (ref 70–99)
Potassium: 4.2 mEq/L (ref 3.5–5.1)
SODIUM: 137 meq/L (ref 135–145)
TOTAL PROTEIN: 6.8 g/dL (ref 6.0–8.3)
Total Bilirubin: 0.4 mg/dL (ref 0.2–1.2)

## 2015-05-31 LAB — TSH: TSH: 1.53 u[IU]/mL (ref 0.35–4.50)

## 2015-05-31 LAB — HEMOGLOBIN A1C: HEMOGLOBIN A1C: 5.5 % (ref 4.6–6.5)

## 2015-05-31 NOTE — Assessment & Plan Note (Signed)
Mild inspiratory wheezing to bilateral upper fields and LLF.

## 2015-05-31 NOTE — Assessment & Plan Note (Signed)
Td UTD. Pap UTD. She is working to lose weight as she is now on a gluten free diet. She is not exercising. Discussed the importance of a healthy diet and regular exercise in order for weight loss and to reduce risk of other medical diseases. Exam unremarkable. Labs pending. Will complete work form once labs received.  Follow up in 1 year for repeat physical.

## 2015-05-31 NOTE — Assessment & Plan Note (Signed)
Stable on current regimen. Continue same. 

## 2015-05-31 NOTE — Assessment & Plan Note (Signed)
Has noticed improvement, some days only requiring alprazolam twice daily. Reminded patient that she will need to be weaned of and that our goal was to gradually do so. Will continue Prozac and monitor for now. Next RX will be for BID dosing of Alprazolam. If no improvement, will add in Buspar.

## 2015-05-31 NOTE — Progress Notes (Signed)
Pre visit review using our clinic review tool, if applicable. No additional management support is needed unless otherwise documented below in the visit note. 

## 2015-05-31 NOTE — Progress Notes (Signed)
Subjective:    Patient ID: Martha Thompson, female    DOB: 11/22/1985, 30 y.o.   MRN: [id number]  HPI  Martha Thompson is a 30 year old female who presents today for follow up of Generalized Anxiety Disorder. She was evaluated in April 2017 as a new patient who was managed solely on alprazolam 1 mg TID (scheduled) that she was taking for anxiety. We discussed last visit that this was not an effective way to manage her anxiety, we would be weaning her off of her alprazolam, and initiating Fluoxetine.  Since her last visit she's still taking her alprazolam, most days three times daily, sometimes twice daily. She initially noticed increased dreaming/nightmares after taking the Fluoxetine, but feels this has improved overall. She feels like she needs her alprazolam as her job is stressful. She's tried hydroxyzine in the past for breakthrough anxiety without improvement.   She also presents today for complete physical.  Immunizations: -Tetanus: Completed within 10 years -Influenza: Did not complete last season.    Diet: She endorses a healthy diet. Breakfast: Protein shake Lunch: Left overs Dinner: Vegetables, chicken, steak, gluten free pasta Snacks: Fruit, protein bar Desserts: Three times weekly Beverages: Water  Exercise: She does not currently exercise. Eye exam: Completed in 2016, normal. Dental exam: Completes semi-annually Pap Smear: Completed March 2017  Wt Readings from Last 3 Encounters:  05/31/15 304 lb (137.893 kg)  04/19/15 307 lb (139.254 kg)  02/18/13 344 lb 6.4 oz (156.219 kg)      Review of Systems  Constitutional: Negative for unexpected weight change.  HENT: Negative for rhinorrhea.   Respiratory: Negative for cough and shortness of breath.   Cardiovascular: Negative for chest pain.  Gastrointestinal: Negative for diarrhea and constipation.  Genitourinary: Negative for difficulty urinating.       IUD placed in May 2015  Musculoskeletal:   Chronic neck pain  Skin:       Follows with allergist for hives.  Allergic/Immunologic: Positive for environmental allergies.  Neurological: Positive for headaches. Negative for dizziness and numbness.  Psychiatric/Behavioral:       See HPI       Past Medical History  Diagnosis Date  . HYPERLIPIDEMIA 06/14/2007  . Morbid obesity (HCC) 09/04/2006  . ANXIETY 09/04/2006  . COMMON MIGRAINE 06/14/2007  . BLEPHARITIS, LEFT 05/21/2008  . OTITIS MEDIA, ACUTE, LEFT 03/27/2008  . SINUSITIS- ACUTE-NOS 10/11/2007  . URI 10/29/2009  . ALLERGIC RHINITIS 10/30/2009  . URTICARIA 05/03/2009  . LOW BACK PAIN 09/04/2006  . TENOSYNOVITIS, WRIST 10/11/2007  . GANGLION CYST, WRIST, LEFT 10/11/2007  . Headache(784.0) 10/23/2008  . GERD (gastroesophageal reflux disease) 04/24/2012  . ASTHMA 12/25/2006    inhaler used 2 days ago  . ASTHMA, WITH ACUTE EXACERBATION 05/03/2009  . Cervical disc disease     2 bulging discs to neck      Social History   Social History  . Marital Status: Married    Spouse Name: N/A  . Number of Children: N/A  . Years of Education: N/A   Occupational History  .  [employer]    12 hour days   Social History Main Topics  . Smoking status: Current Every Day Smoker -- 0.50 packs/day    Types: Cigarettes  . Smokeless tobacco: Not on file  . Alcohol Use: 0.0 oz/week    0 Standard drinks or equivalent per week     Comment: rarely  . Drug Use: No  . Sexual Activity: Not on file   Other Topics  Concern  . Not on file   Social History Narrative   Married.   2 children.   Works for [employer].   Enjoys spending time outdoors.       Past Surgical History  Procedure Laterality Date  . Back surgury  01/2003    s/p lumbar disc  . Laparoscopy  04/07/2011    Procedure: LAPAROSCOPY OPERATIVE;  Surgeon: Meriel Pica, MD;  Location: [facility];  Service: Gynecology;  Laterality: N/A;  left salpingogectomy  . Cesarean section N/A 02/19/2013    Procedure: CESAREAN SECTION;  Surgeon:  Michael Litter, MD;  Location: [facility];  Service: Obstetrics;  Laterality: N/A;    Family History  Problem Relation Age of Onset  . Anxiety disorder Father   . Diabetes Father     No Known Allergies  Current Outpatient Prescriptions on File Prior to Visit  Medication Sig Dispense Refill  . albuterol (PROVENTIL HFA;VENTOLIN HFA) 108 (90 BASE) MCG/ACT inhaler Inhale 2 puffs into the lungs every 6 (six) hours as needed. 3 Inhaler 3  . ALPRAZolam (XANAX) 1 MG tablet Take 1 mg by mouth 3 (three) times daily as needed for anxiety.    . cetirizine (ZYRTEC) 10 MG tablet Take 1 tablet (10 mg total) by mouth daily. 90 tablet 3  . FLUoxetine (PROZAC) 20 MG tablet Take 1 tablet (20 mg total) by mouth daily. 30 tablet 3  . losartan (COZAAR) 25 MG tablet Take 25 mg by mouth daily.     No current facility-administered medications on file prior to visit.    BP 122/78 mmHg  Pulse 91  Temp(Src) 97.9 F (36.6 C) (Oral)  Ht  (1.778 m)  Wt 304 lb (137.893 kg)  BMI 43.62 kg/m2  SpO2 90%    Objective:   Physical Exam  Constitutional: She is oriented to person, place, and time. She appears well-nourished.  HENT:  Right Ear: Tympanic membrane and ear canal normal.  Left Ear: Tympanic membrane and ear canal normal.  Nose: Nose normal.  Mouth/Throat: Oropharynx is clear and moist.  Eyes: Conjunctivae and EOM are normal. Pupils are equal, round, and reactive to light.  Neck: Neck supple. No thyromegaly present.  Cardiovascular: Normal rate and regular rhythm.   No murmur heard. Pulmonary/Chest: Effort normal and breath sounds normal. She has no rales.  Abdominal: Soft. Bowel sounds are normal. There is no tenderness.  Musculoskeletal: Normal range of motion.  Lymphadenopathy:    She has no cervical adenopathy.  Neurological: She is alert and oriented to person, place, and time. She has normal reflexes. No cranial nerve deficit.  Skin: Skin is warm and dry. No rash noted.  Psychiatric:  She has a normal mood and affect.  See HPI.          Assessment & Plan:

## 2015-05-31 NOTE — Patient Instructions (Signed)
Complete lab work prior to leaving today. I will notify you of your results once received.   Complete the urine drug screen and controlled substance contract prior to leaving today.  Attempt to wean off the Alprazolam as discussed. I would like for you to try taking twice daily as needed for several weeks, then once daily as needed for several weeks, then only as needed.  Start exercising. You should be getting 1 hour of moderate intensity exercise 5 days weekly.  Ensure you are consuming 64 ounces of water daily.  Follow up in 2 months for re-evaluation of anxiety.  It was a pleasure to see you today!

## 2015-06-01 ENCOUNTER — Telehealth: Payer: Self-pay | Admitting: Primary Care

## 2015-06-01 ENCOUNTER — Encounter: Payer: Self-pay | Admitting: *Deleted

## 2015-06-01 NOTE — Telephone Encounter (Signed)
Spoken and notified patient of Kate's comments. Patient verbalized understanding. 

## 2015-06-01 NOTE — Telephone Encounter (Signed)
Best number [phone] Pt returned your call

## 2015-06-16 ENCOUNTER — Encounter: Payer: Self-pay | Admitting: Primary Care

## 2015-06-17 ENCOUNTER — Other Ambulatory Visit: Payer: Self-pay

## 2015-06-17 MED ORDER — LOSARTAN POTASSIUM 25 MG PO TABS: 25.0000 mg | ORAL_TABLET | Freq: Every day | ORAL | Status: DC

## 2015-06-17 NOTE — Telephone Encounter (Signed)
Pt said she had been without BP med for one week due to no response for med refill from [facility]; I [facility]gan at [facility] and refill request was sent to Marletta LorJulie Barr NP that prescribed losartan. Per protocol filled losartan 25 mg taking one daily po; pt seen 05/31/15 and pt voiced understanding and will pick up med.

## 2015-06-25 ENCOUNTER — Other Ambulatory Visit: Payer: Self-pay | Admitting: Primary Care

## 2015-06-25 DIAGNOSIS — F411 Generalized anxiety disorder: Secondary | ICD-10-CM

## 2015-06-25 MED ORDER — ALPRAZOLAM 1 MG PO TABS: 1.0000 mg | ORAL_TABLET | Freq: Two times a day (BID) | ORAL | Status: DC | PRN

## 2015-06-25 NOTE — Telephone Encounter (Signed)
Called in Xanax to [facility].

## 2015-06-25 NOTE — Telephone Encounter (Signed)
Received refill request from [facility] for   ALPRAZolam Prudy Feeler(XANAX) 1 MG tablet       Take 1 mg by mouth 3 (three) times daily as needed for anxiety.   This has not been prescribed by Jae DireKate. Patient was last seen on 05/31/2015. Next appt on 08/04/2015 for Re-eval of anxiety.

## 2015-08-02 ENCOUNTER — Other Ambulatory Visit: Payer: Self-pay | Admitting: Primary Care

## 2015-08-02 DIAGNOSIS — F411 Generalized anxiety disorder: Secondary | ICD-10-CM

## 2015-08-02 NOTE — Telephone Encounter (Signed)
Received faxed refill request for alprazolam 1 mg tablet. Last prescribed on 06/25/2015. Last seen on 05/31/2015. No future appointment. Patient canceled appointment on 08/04/2015.

## 2015-08-03 ENCOUNTER — Telehealth: Payer: Self-pay | Admitting: Primary Care

## 2015-08-03 MED ORDER — ALPRAZOLAM 0.5 MG PO TABS: 0.5000 mg | ORAL_TABLET | Freq: Two times a day (BID) | ORAL | 0 refills | Status: DC | PRN

## 2015-08-03 NOTE — Telephone Encounter (Signed)
Please notify patient that I am weaning her off slowly over a period of 6 months as it has been 3 months thus far (half way through). We discussed on her initial visit that we will gradually be weaning her down as we have done so. She is welcome to look for a new provider or schedule follow up with me at her convenience to discuss, but I stand firm at the reduced dose.

## 2015-08-03 NOTE — Telephone Encounter (Signed)
Please notify patient that probably reducing her alprazolam to 0.5 mg tablets in an attempt to wean her down as discussed during the past several visits. She may take 1 tablet by mouth twice daily as needed for anxiety. Please remind her this is only to be used as needed. I see that she canceled her appointment for tomorrow. She will need an office visit for any further refills on her Xanax.  Please call in alprazolam 0.5 mg. Take 1 tablet by mouth twice daily as needed for anxiety. #60, 0 refills.

## 2015-08-03 NOTE — Telephone Encounter (Signed)
Called in alprazolam 0.5 mg to [facility] pharmacy.  Message left for patient to return my call.

## 2015-08-03 NOTE — Telephone Encounter (Signed)
Patient called back. Patient stated that she is concern of the reducing of the alprazolam. She thought she would have more time like 6 months or then start reducing the dosage. Patient stated she knows her body and the stress that she is having at work. She does not understanding why she would need to reduce at this moment. Patient called the appointment because of work and with limited staff at work, she had to cancel. Notified patient that already called in the alprazolam 0.5 mg tablet. Patient stated that she does not understand why she cannot have more time to wean down. Patient stated that she wants to think about it. She may pick up the alprazolam 0.5 mg or will look for another provider.

## 2015-08-04 ENCOUNTER — Ambulatory Visit: Payer: Managed Care, Other (non HMO) | Admitting: Primary Care

## 2015-08-04 ENCOUNTER — Telehealth: Payer: Self-pay | Admitting: Primary Care

## 2015-08-04 NOTE — Telephone Encounter (Signed)
Ok with me 

## 2015-08-04 NOTE — Telephone Encounter (Signed)
Okay with me 

## 2015-08-04 NOTE — Telephone Encounter (Signed)
Patient is requesting to transfer from [facility] to [facility].  Patient use to be a patient of Dr. Jonny Ruiz and would like to go back.  Please advise.

## 2015-08-04 NOTE — Telephone Encounter (Signed)
Message left for patient to return my call.  

## 2015-08-05 NOTE — Telephone Encounter (Signed)
Message left for patient to return my call.  

## 2015-08-05 NOTE — Telephone Encounter (Signed)
Got scheduled  °

## 2015-08-13 ENCOUNTER — Encounter: Payer: Self-pay | Admitting: Internal Medicine

## 2015-08-13 ENCOUNTER — Ambulatory Visit (INDEPENDENT_AMBULATORY_CARE_PROVIDER_SITE_OTHER): Payer: Managed Care, Other (non HMO) | Admitting: Internal Medicine

## 2015-08-13 VITALS — BP 116/78 | HR 110 | Temp 98.5°F | Ht 71.0 in | Wt 311.0 lb

## 2015-08-13 DIAGNOSIS — J309 Allergic rhinitis, unspecified: Secondary | ICD-10-CM

## 2015-08-13 DIAGNOSIS — J4520 Mild intermittent asthma, uncomplicated: Secondary | ICD-10-CM | POA: Diagnosis not present

## 2015-08-13 DIAGNOSIS — I10 Essential (primary) hypertension: Secondary | ICD-10-CM

## 2015-08-13 DIAGNOSIS — J069 Acute upper respiratory infection, unspecified: Secondary | ICD-10-CM

## 2015-08-13 DIAGNOSIS — L509 Urticaria, unspecified: Secondary | ICD-10-CM

## 2015-08-13 DIAGNOSIS — R739 Hyperglycemia, unspecified: Secondary | ICD-10-CM

## 2015-08-13 DIAGNOSIS — F411 Generalized anxiety disorder: Secondary | ICD-10-CM

## 2015-08-13 DIAGNOSIS — Z0001 Encounter for general adult medical examination with abnormal findings: Secondary | ICD-10-CM

## 2015-08-13 DIAGNOSIS — R6889 Other general symptoms and signs: Secondary | ICD-10-CM

## 2015-08-13 MED ORDER — LEVOFLOXACIN 500 MG PO TABS: 500.0000 mg | ORAL_TABLET | Freq: Every day | ORAL | 0 refills | Status: AC

## 2015-08-13 MED ORDER — PREDNISONE 10 MG PO TABS: ORAL_TABLET | ORAL | 0 refills | Status: DC

## 2015-08-13 MED ORDER — METHYLPREDNISOLONE ACETATE 80 MG/ML IJ SUSP
80.0000 mg | Freq: Once | INTRAMUSCULAR | Status: AC
  Administered 2015-08-13: 80 mg via INTRAMUSCULAR

## 2015-08-13 MED ORDER — BECLOMETHASONE DIPROPIONATE 80 MCG/ACT IN AERS: 2.0000 | INHALATION_SPRAY | Freq: Two times a day (BID) | RESPIRATORY_TRACT | 12 refills | Status: DC

## 2015-08-13 MED ORDER — LOSARTAN POTASSIUM 25 MG PO TABS: 25.0000 mg | ORAL_TABLET | Freq: Every day | ORAL | 3 refills | Status: DC

## 2015-08-13 MED ORDER — ALBUTEROL SULFATE HFA 108 (90 BASE) MCG/ACT IN AERS: 2.0000 | INHALATION_SPRAY | Freq: Four times a day (QID) | RESPIRATORY_TRACT | 3 refills | Status: DC | PRN

## 2015-08-13 MED ORDER — CETIRIZINE HCL 10 MG PO TABS: 10.0000 mg | ORAL_TABLET | Freq: Every day | ORAL | 3 refills | Status: DC

## 2015-08-13 MED ORDER — ALPRAZOLAM 1 MG PO TABS: 1.0000 mg | ORAL_TABLET | Freq: Two times a day (BID) | ORAL | 5 refills | Status: DC | PRN

## 2015-08-13 MED ORDER — FLUCONAZOLE 150 MG PO TABS: ORAL_TABLET | ORAL | 1 refills | Status: DC

## 2015-08-13 NOTE — Assessment & Plan Note (Signed)
Chronic persistent, for benzo refill, to f/u any worsening symptoms or concerns

## 2015-08-13 NOTE — Progress Notes (Signed)
Subjective:    Patient ID: Martha Thompson, female    DOB: 1985/09/15, 30 y.o.   MRN: [id number]  HPI  Here to f/u to re-establish, Pt denies chest pain, orthopnea, PND, increased LE swelling, palpitations, dizziness or syncope.  Pt denies new neurological symptoms such as new headache, or facial or extremity weakness or numbness   Pt denies polydipsia, polyuria,  Gained 70 lbs with pregnancy, then lost 50 lbs. Denies worsening depressive symptoms, suicidal ideation, or panic; has ongoing anxiety, increased recently with attempted reduced xanax,.but chronic persistent with what sounds like benzo use since 30yo.  Couldn't take the prozac due to first few day adverse new severe insomnia. Was not able to toelrate half her chronic xanax dosing, despite also atarax 50 mg per allergist for chronic hives.  Sees GYN yearly, last seen may 2017   Has IUD, wants to cont the losartan Incidentally -  Here with 2-3 days acute onset fever, facial pain, pressure, headache, general weakness and malaise, and greenish d/c, with mild ST and cough  Also still with recurring Hives x 4 mo, none at the moment but has used over 40 50 mg benadryl pill in the past 2 wks.  Does have several wks ongoing nasal allergy symptoms with clearish congestion, itch and sneezing, and also mild worsening 1 mo sob/doe/wheezing despite inhaler more freq use. Past Medical History:  Diagnosis Date  . ALLERGIC RHINITIS 10/30/2009  . ANXIETY 09/04/2006  . ASTHMA 12/25/2006   inhaler used 2 days ago  . ASTHMA, WITH ACUTE EXACERBATION 05/03/2009  . BLEPHARITIS, LEFT 05/21/2008  . Cervical disc disease    2 bulging discs to neck   . COMMON MIGRAINE 06/14/2007  . GANGLION CYST, WRIST, LEFT 10/11/2007  . GERD (gastroesophageal reflux disease) 04/24/2012  . Headache(784.0) 10/23/2008  . HYPERLIPIDEMIA 06/14/2007  . LOW BACK PAIN 09/04/2006  . Morbid obesity (HCC) 09/04/2006  . OTITIS MEDIA, ACUTE, LEFT 03/27/2008  . SINUSITIS- ACUTE-NOS 10/11/2007    . TENOSYNOVITIS, WRIST 10/11/2007  . URI 10/29/2009  . URTICARIA 05/03/2009   Past Surgical History:  Procedure Laterality Date  . back surgury  01/2003   s/p lumbar disc  . CESAREAN SECTION N/A 02/19/2013   Procedure: CESAREAN SECTION;  Surgeon: Michael Litter, MD;  Location: [facility];  Service: Obstetrics;  Laterality: N/A;  . LAPAROSCOPY  04/07/2011   Procedure: LAPAROSCOPY OPERATIVE;  Surgeon: Meriel Pica, MD;  Location: [facility];  Service: Gynecology;  Laterality: N/A;  left salpingogectomy    reports that she has been smoking Cigarettes.  She has been smoking about 0.50 packs per day. She does not have any smokeless tobacco history on file. She reports that she drinks alcohol. She reports that she does not use drugs. family history includes Anxiety disorder in her father; Diabetes in her father. Allergies  Allergen Reactions  . Prozac [Fluoxetine Hcl] Other (See Comments)   No current outpatient prescriptions on file prior to visit.   No current facility-administered medications on file prior to visit.    Review of Systems  Constitutional: Negative for unusual diaphoresis or night sweats HENT: Negative for ear swelling or discharge Eyes: Negative for worsening visual haziness  Respiratory: Negative for choking and stridor.   Gastrointestinal: Negative for distension or worsening eructation Genitourinary: Negative for retention or change in urine volume.  Musculoskeletal: Negative for other MSK pain or swelling Skin: Negative for color change and worsening wound Neurological: Negative for tremors and numbness other than noted  Psychiatric/Behavioral: Negative  for decreased concentration or agitation other than above       Objective:   Physical Exam BP 116/78   Pulse (!) 110   Temp 98.5 F (36.9 C) (Oral)   Ht 5\' 11"  (1.803 m)   Wt (!) 311 lb (141.1 kg)   SpO2 97%   BMI 43.38 kg/m  VS noted, morbid obese, mild ill Constitutional: Pt appears in no apparent  distress HENT: Head: NCAT.  Right Ear: External ear normal.  Left Ear: External ear normal.  Bilat tm's with mild erythema.  Max sinus areas mild tender.  Pharynx with mild erythema, no exudate Eyes: . Pupils are equal, round, and reactive to light. Conjunctivae and EOM are normal Neck: Normal range of motion. Neck supple.  Cardiovascular: Normal rate and regular rhythm.   Pulmonary/Chest: Effort normal and breath sounds without rales or wheezing.  Abd:  Soft, NT, ND, + BS Neurological: Pt is alert. Not confused , motor grossly intact Skin: Skin is warm. No rash, no LE edema Psychiatric: Pt behavior is normal. No agitation.      Assessment & Plan:

## 2015-08-13 NOTE — Assessment & Plan Note (Signed)
stable overall by history and exam, recent data reviewed with pt, and pt to continue medical treatment as before,  to f/u any worsening symptoms or concerns BP Readings from Last 3 Encounters:  08/13/15 116/78  05/31/15 122/78  04/19/15 126/84

## 2015-08-13 NOTE — Assessment & Plan Note (Signed)
Chronic recurrent, improved at the moment but has pics on cell phone obvious wheal and flares to extremities, etiology unclear, persistent symptoms, for depomedrol IM, predpac asd,  to f/u any worsening symptoms or concerns

## 2015-08-13 NOTE — Assessment & Plan Note (Signed)
Also should improve with steroid tx today,  to f/u any worsening symptoms or concerns, for otc zyrtec and nasacort

## 2015-08-13 NOTE — Patient Instructions (Addendum)
You had the steroid shot today  Please take all new medication as prescribed - the prednisone, and Qvar inhaler, the antibiotic, and the diflucan  Please continue all other medications as before, and refills have been done if requested.  Please have the pharmacy call with any other refills you may need.  Please keep your appointments with your specialists as you may have planned  Please return in 6 months, or sooner if needed, with Lab testing done 3-5 days before

## 2015-08-13 NOTE — Assessment & Plan Note (Signed)
stable overall by history and exam, recent data reviewed with pt, and pt to continue medical treatment as before,  to f/u any worsening symptoms or concerns Lab Results  Component Value Date   HGBA1C 5.5 05/31/2015

## 2015-08-13 NOTE — Assessment & Plan Note (Signed)
Mild uncontrolled, for add qvar,  to f/u any worsening symptoms or concerns

## 2015-08-13 NOTE — Progress Notes (Signed)
Pre visit review using our clinic review tool, if applicable. No additional management support is needed unless otherwise documented below in the visit note. 

## 2015-08-13 NOTE — Assessment & Plan Note (Signed)
Mild to mod, for antibx course,  to f/u any worsening symptoms or concerns  Note:  Total time for pt hx, exam, review of record with pt in the room, determination of diagnoses and plan for further eval and tx is > 40 min, with over 50% spent in coordination and counseling of patient 

## 2015-09-16 ENCOUNTER — Encounter: Payer: Self-pay | Admitting: Internal Medicine

## 2015-09-16 ENCOUNTER — Ambulatory Visit (INDEPENDENT_AMBULATORY_CARE_PROVIDER_SITE_OTHER): Payer: Managed Care, Other (non HMO) | Admitting: Internal Medicine

## 2015-09-16 DIAGNOSIS — M5416 Radiculopathy, lumbar region: Secondary | ICD-10-CM

## 2015-09-16 DIAGNOSIS — R739 Hyperglycemia, unspecified: Secondary | ICD-10-CM

## 2015-09-16 DIAGNOSIS — M5030 Other cervical disc degeneration, unspecified cervical region: Secondary | ICD-10-CM | POA: Diagnosis not present

## 2015-09-16 MED ORDER — CYCLOBENZAPRINE HCL 5 MG PO TABS: 5.0000 mg | ORAL_TABLET | Freq: Three times a day (TID) | ORAL | 1 refills | Status: DC | PRN

## 2015-09-16 MED ORDER — OXYCODONE-ACETAMINOPHEN 7.5-325 MG PO TABS: 1.0000 | ORAL_TABLET | Freq: Four times a day (QID) | ORAL | 0 refills | Status: DC | PRN

## 2015-09-16 MED ORDER — PREDNISONE 10 MG PO TABS: ORAL_TABLET | ORAL | 0 refills | Status: DC

## 2015-09-16 NOTE — Progress Notes (Signed)
Pre visit review using our clinic review tool, if applicable. No additional management support is needed unless otherwise documented below in the visit note. 

## 2015-09-16 NOTE — Patient Instructions (Signed)
Please take all new medication as prescribed - the pain medication, muscle relaxer as needed, and prednisone  Please continue all other medications as before, and refills have been done if requested.  Please have the pharmacy call with any other refills you may need.  Please keep your appointments with your specialists as you may have planned  You will be contacted regarding the referral for: MRI LS spine, and Neurosurgury

## 2015-09-16 NOTE — Progress Notes (Signed)
Subjective:    Patient ID: Martha Thompson, female    DOB: [birth year], 30 y.o.   MRN: [id number]  HPI  Here with 2.5 mo ongoing left lower back pain, now suddely severely worse in last 7 days, limps to walk, pain 8-9/10, constant, radiates to the left calf and assoc with LLE weakness mild, and fell getting up to BR at night 2 nights ago.. Worse to cough, or left leg straithening at night. Denies urinary symptoms such as dysuria, frequency, urgency, flank pain, hematuria or n/v, fever, chills.  Denies worsening reflux, abd pain, dysphagia, n/v, bowel change or blood.  Is sp prior lumbar surgury at 30yo for disc issue, no issues since.  No recent MRI, PT or surgical evaluation.  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   Pt denies fever, wt loss, night sweats, loss of appetite, or other constitutional symptoms   Wt Readings from Last 3 Encounters:  09/16/15 (!) 309 lb (140.2 kg)  08/13/15 (!) 311 lb (141.1 kg)  05/31/15 (!) 304 lb (137.9 kg)  Wt remians an issue and has gained further ever since her c-section.    Past Medical History:  Diagnosis Date  . ALLERGIC RHINITIS 10/30/2009  . ANXIETY 09/04/2006  . ASTHMA 12/25/2006   inhaler used 2 days ago  . ASTHMA, WITH ACUTE EXACERBATION 05/03/2009  . BLEPHARITIS, LEFT 05/21/2008  . Cervical disc disease    2 bulging discs to neck   . COMMON MIGRAINE 06/14/2007  . GANGLION CYST, WRIST, LEFT 10/11/2007  . GERD (gastroesophageal reflux disease) 04/24/2012  . Headache(784.0) 10/23/2008  . HYPERLIPIDEMIA 06/14/2007  . LOW BACK PAIN 09/04/2006  . Morbid obesity (HCC) 09/04/2006  . OTITIS MEDIA, ACUTE, LEFT 03/27/2008  . SINUSITIS- ACUTE-NOS 10/11/2007  . TENOSYNOVITIS, WRIST 10/11/2007  . URI 10/29/2009  . URTICARIA 05/03/2009   Past Surgical History:  Procedure Laterality Date  . back surgury   01/2003   s/p lumbar disc  . CESAREAN SECTION N/A 02/19/2013   Procedure: CESAREAN SECTION;  Surgeon: Michael Litter, MD;  Location: [facility];  Service: Obstetrics;  Laterality: N/A;  . LAPAROSCOPY  04/07/2011   Procedure: LAPAROSCOPY OPERATIVE;  Surgeon: Meriel Pica, MD;  Location: [facility];  Service: Gynecology;  Laterality: N/A;  left salpingogectomy    reports that she has been smoking Cigarettes.  She has been smoking about 0.50 packs per day. She does not have any smokeless tobacco history on file. She reports that she drinks alcohol. She reports that she does not use drugs. family history includes Anxiety disorder in her father; Diabetes in her father. Allergies  Allergen Reactions  . Prozac [Fluoxetine Hcl] Other (See Comments)   Current Outpatient Prescriptions on File Prior to Visit  Medication Sig Dispense Refill  . albuterol (PROVENTIL HFA;VENTOLIN HFA) 108 (90 Base) MCG/ACT inhaler Inhale 2 puffs into the lungs every 6 (six) hours as needed. 3 Inhaler 3  . ALPRAZolam (XANAX) 1 MG tablet Take 1 tablet (1 mg total) by mouth 2 (two) times daily as needed for anxiety. 60 tablet 5  . beclomethasone (QVAR) 80 MCG/ACT inhaler Inhale 2 puffs into the lungs 2 (two) times daily. 1 Inhaler 12  . cetirizine (ZYRTEC) 10 MG tablet Take 1 tablet (10 mg total) by mouth daily. 90 tablet 3  . fluconazole (DIFLUCAN) 150  MG tablet 1 tab by mouth every 3 days as needed 2 tablet 1  . losartan (COZAAR) 25 MG tablet Take 1 tablet (25 mg total) by mouth daily. 90 tablet 3   No current facility-administered medications on file prior to visit.    Review of Systems  Constitutional: Negative for unusual diaphoresis or night sweats HENT: Negative for ear swelling or discharge Eyes: Negative for worsening visual haziness  Respiratory: Negative for choking and stridor.   Gastrointestinal: Negative for distension or worsening eructation Genitourinary: Negative for retention or change in urine volume.    Musculoskeletal: Negative for other MSK pain or swelling Skin: Negative for color change and worsening wound Neurological: Negative for tremors and numbness other than noted  Psychiatric/Behavioral: Negative for decreased concentration or agitation other than above       Objective:   Physical Exam BP 134/74   Pulse 100   Temp 98.4 F (36.9 C) (Oral)   Resp 20   Wt (!) 309 lb (140.2 kg)   SpO2 96%   BMI 43.10 kg/m  VS noted,  Constitutional: Pt appears in no apparent distress HENT: Head: NCAT.  Right Ear: External ear normal.  Left Ear: External ear normal.  Eyes: . Pupils are equal, round, and reactive to light. Conjunctivae and EOM are normal Neck: Normal range of motion. Neck supple.  Cardiovascular: Normal rate and regular rhythm.   Pulmonary/Chest: Effort normal and breath sounds without rales or wheezing.  Abd:  Soft, NT, ND, + BS Neurological: Pt is alert. Not confused , motor 4/5 LLE weakness o/w intact, sens intact to LT but diminished patellar and L5 reflex, no rash or Leg swelling. Spine nontender in midline + tender Left lumbar paravertebral with some muscle spasm as well Skin: Skin is warm. No rash, no LE edema Psychiatric: Pt behavior is normal. No agitation.     Assessment & Plan:

## 2015-09-16 NOTE — Assessment & Plan Note (Signed)
stable overall by history and exam, recent data reviewed with pt, and pt to continue medical treatment as before,  to f/u any worsening symptoms or concerns le Lab Results  Component Value Date   HGBA1C 5.5 05/31/2015   Pt to call for onset polys or cbg > 200 on steroid

## 2015-09-16 NOTE — Assessment & Plan Note (Signed)
With intermittent LBP x 2-3 mo, now suddently worsened with neuro change on exam; for LS spine MRI and NS eval as soon as possible, also for percocet 7.5's, flexeril prn, and trial empiric predpac asd.

## 2015-09-16 NOTE — Assessment & Plan Note (Signed)
Has been referred to neurology with appt per pt in mid oct 2017, I suggested she d/w NS as well in addition to her lower back

## 2015-09-26 ENCOUNTER — Ambulatory Visit
Admission: RE | Admit: 2015-09-26 | Discharge: 2015-09-26 | Disposition: A | Discharge status: Home / Self Care | Payer: Managed Care, Other (non HMO) | Source: Ambulatory Visit | Attending: Internal Medicine | Admitting: Internal Medicine

## 2015-09-26 DIAGNOSIS — M5416 Radiculopathy, lumbar region: Secondary | ICD-10-CM

## 2015-09-27 ENCOUNTER — Telehealth: Payer: Self-pay

## 2015-09-27 NOTE — Telephone Encounter (Signed)
 imaging called with results for MRI. Please advise on what to do. Dr. Jonny RuizJohn pt and he is not here on mondays

## 2015-09-27 NOTE — Telephone Encounter (Signed)
Please inform the patient that she has 2 levels of her spine with disc bulging and extrusion. Therefore the next step is to follow up with neurosurgery as scheduled.

## 2015-09-28 ENCOUNTER — Encounter: Payer: Self-pay | Admitting: Internal Medicine

## 2015-09-28 NOTE — Telephone Encounter (Signed)
Let pt know this. Her appointment for neurosurgery is on 10/19. She states she will need another refill of pain medications before then. I advised her to call and let us know closer to the time its due.

## 2015-10-11 ENCOUNTER — Telehealth: Payer: Self-pay | Admitting: *Deleted

## 2015-10-11 NOTE — Telephone Encounter (Signed)
Rec'd call pt requesting refill on her pain med "Oxycodone". MD out of office will hold until MD return tomorrow...Raechel Chute/lmb

## 2015-10-12 MED ORDER — OXYCODONE-ACETAMINOPHEN 7.5-325 MG PO TABS: 1.0000 | ORAL_TABLET | Freq: Four times a day (QID) | ORAL | 0 refills | Status: DC | PRN

## 2015-10-12 NOTE — Telephone Encounter (Signed)
Notified pt rx ready for pick-up.../lmb 

## 2015-10-12 NOTE — Telephone Encounter (Signed)
Done hardcopy to Corinne  

## 2015-11-09 ENCOUNTER — Telehealth: Payer: Self-pay | Admitting: Internal Medicine

## 2015-11-09 MED ORDER — OXYCODONE-ACETAMINOPHEN 7.5-325 MG PO TABS: 1.0000 | ORAL_TABLET | Freq: Four times a day (QID) | ORAL | 0 refills | Status: DC | PRN

## 2015-11-09 NOTE — Telephone Encounter (Signed)
Notified pt rx ready for pick-up.../lmb 

## 2015-11-09 NOTE — Telephone Encounter (Signed)
Done hardcopy to Corinne  

## 2015-11-09 NOTE — Telephone Encounter (Signed)
oxyCODONE-acetaminophen (PERCOCET) 7.5-325 MG tablet   Patient is requesting a refill on this medication. She states her surgery is not until December. Please follow up, thank you.

## 2016-02-08 ENCOUNTER — Ambulatory Visit (INDEPENDENT_AMBULATORY_CARE_PROVIDER_SITE_OTHER): Payer: 59 | Admitting: Internal Medicine

## 2016-02-08 ENCOUNTER — Other Ambulatory Visit (INDEPENDENT_AMBULATORY_CARE_PROVIDER_SITE_OTHER): Payer: 59

## 2016-02-08 VITALS — BP 130/80 | HR 112 | Temp 98.0°F | Resp 20 | Wt 337.0 lb

## 2016-02-08 DIAGNOSIS — G4700 Insomnia, unspecified: Secondary | ICD-10-CM | POA: Diagnosis not present

## 2016-02-08 DIAGNOSIS — R739 Hyperglycemia, unspecified: Secondary | ICD-10-CM

## 2016-02-08 DIAGNOSIS — Z0001 Encounter for general adult medical examination with abnormal findings: Secondary | ICD-10-CM

## 2016-02-08 DIAGNOSIS — F411 Generalized anxiety disorder: Secondary | ICD-10-CM | POA: Diagnosis not present

## 2016-02-08 LAB — CBC WITH DIFFERENTIAL/PLATELET
BASOS ABS: 0 10*3/uL (ref 0.0–0.1)
Basophils Relative: 0.4 % (ref 0.0–3.0)
Eosinophils Absolute: 0.5 10*3/uL (ref 0.0–0.7)
Eosinophils Relative: 4.3 % (ref 0.0–5.0)
HCT: 45.3 % (ref 36.0–46.0)
Hemoglobin: 15.1 g/dL — ABNORMAL HIGH (ref 12.0–15.0)
LYMPHS ABS: 3.5 10*3/uL (ref 0.7–4.0)
Lymphocytes Relative: 32 % (ref 12.0–46.0)
MCHC: 33.4 g/dL (ref 30.0–36.0)
MCV: 93 fl (ref 78.0–100.0)
MONOS PCT: 7.4 % (ref 3.0–12.0)
Monocytes Absolute: 0.8 10*3/uL (ref 0.1–1.0)
NEUTROS ABS: 6.2 10*3/uL (ref 1.4–7.7)
NEUTROS PCT: 55.9 % (ref 43.0–77.0)
PLATELETS: 213 10*3/uL (ref 150.0–400.0)
RBC: 4.87 Mil/uL (ref 3.87–5.11)
RDW: 12.8 % (ref 11.5–15.5)
WBC: 11 10*3/uL — ABNORMAL HIGH (ref 4.0–10.5)

## 2016-02-08 MED ORDER — TRAZODONE HCL 50 MG PO TABS: 25.0000 mg | ORAL_TABLET | Freq: Every evening | ORAL | 1 refills | Status: DC | PRN

## 2016-02-08 MED ORDER — ALPRAZOLAM 1 MG PO TABS: 1.0000 mg | ORAL_TABLET | Freq: Three times a day (TID) | ORAL | 5 refills | Status: DC | PRN

## 2016-02-08 MED ORDER — MELOXICAM 15 MG PO TABS: 15.0000 mg | ORAL_TABLET | Freq: Every day | ORAL | 2 refills | Status: DC

## 2016-02-08 MED ORDER — LOSARTAN POTASSIUM 25 MG PO TABS: 25.0000 mg | ORAL_TABLET | Freq: Every day | ORAL | 3 refills | Status: DC

## 2016-02-08 NOTE — Assessment & Plan Note (Signed)
Asympt, for a1c with labs, cont wt loss efforts

## 2016-02-08 NOTE — Assessment & Plan Note (Signed)
Ok for trazodone qhs  Prn, to f/u any worsening symptoms or concerns

## 2016-02-08 NOTE — Progress Notes (Signed)
Subjective:    Patient ID: Martha Thompson, female    DOB: [birth year], 31 y.o.   MRN: [id number]  HPI  Here for wellness and f/u;  Overall doing ok;  Pt denies Chest pain, worsening SOB, DOE, wheezing, orthopnea, PND, worsening LE edema, palpitations, dizziness or syncope.  Pt denies neurological change such as new headache, facial or extremity weakness.  Pt denies polydipsia, polyuria, or low sugar symptoms. Pt states overall good compliance with treatment and medications, good tolerability, and has been trying to follow appropriate diet. No fever, night sweats, wt loss, loss of appetite, or other constitutional symptoms.  Pt states good ability with ADL's, has low fall risk, home safety reviewed and adequate, no other significant changes in hearing or vision, and only occasionally active with exercise. No other changes to basic hx except:  S/p lumbar surgury x 2 mo, has f/u next wk, may go back to work hopefully mar 1. On small amount of ST disability, financial pressure mod to severe, cant sleep at night, anxiety through the roof.  Has been takin xanax tid instead of bid since she had this before.  Not pregnant, has IUD.  Has 58 yo child, starting to be more on her nerves. Denies worsening depressive symptoms, suicidal ideation, or panic; gained some wt staying at home, trying to lose wt with diet, but cannot even vacuum until recently.   Wt Readings from Last 3 Encounters:  02/08/16 (!) 337 lb (152.9 kg)  09/16/15 (!) 309 lb (140.2 kg)  08/13/15 (!) 311 lb (141.1 kg)  Has tried lexapro and zoloft and intolerable.  Has fatigue without hypersomnolence Past Medical History:  Diagnosis Date  . ALLERGIC RHINITIS 10/30/2009  . ANXIETY 09/04/2006  . ASTHMA 12/25/2006   inhaler used 2 days ago  . ASTHMA, WITH ACUTE EXACERBATION 05/03/2009  . BLEPHARITIS, LEFT 05/21/2008  . Cervical disc disease    2 bulging discs to neck   . COMMON MIGRAINE 06/14/2007  . GANGLION CYST, WRIST, LEFT 10/11/2007  .  GERD (gastroesophageal reflux disease) 04/24/2012  . Headache(784.0) 10/23/2008  . HYPERLIPIDEMIA 06/14/2007  . LOW BACK PAIN 09/04/2006  . Morbid obesity (HCC) 09/04/2006  . OTITIS MEDIA, ACUTE, LEFT 03/27/2008  . SINUSITIS- ACUTE-NOS 10/11/2007  . TENOSYNOVITIS, WRIST 10/11/2007  . URI 10/29/2009  . URTICARIA 05/03/2009   Past Surgical History:  Procedure Laterality Date  . back surgury  01/2003   s/p lumbar disc  . CESAREAN SECTION N/A 02/19/2013   Procedure: CESAREAN SECTION;  Surgeon: Michael Litter, MD;  Location: [facility];  Service: Obstetrics;  Laterality: N/A;  . LAPAROSCOPY  04/07/2011   Procedure: LAPAROSCOPY OPERATIVE;  Surgeon: Meriel Pica, MD;  Location: [facility];  Service: Gynecology;  Laterality: N/A;  left salpingogectomy    reports that she has been smoking Cigarettes.  She has been smoking about 0.50 packs per day. She does not have any smokeless tobacco history on file. She reports that she drinks alcohol. She reports that she does not use drugs. family history includes Anxiety disorder in her father; Diabetes in her father. Allergies  Allergen Reactions  . Prozac [Fluoxetine Hcl] Other (See Comments)   Current Outpatient Prescriptions on File Prior to Visit  Medication Sig Dispense Refill  . albuterol (PROVENTIL HFA;VENTOLIN HFA) 108 (90 Base) MCG/ACT inhaler Inhale 2 puffs into the lungs every 6 (six) hours as needed. 3 Inhaler 3  . beclomethasone (QVAR) 80 MCG/ACT inhaler Inhale 2 puffs into the lungs 2 (two) times daily. 1  Inhaler 12  . cetirizine (ZYRTEC) 10 MG tablet Take 1 tablet (10 mg total) by mouth daily. 90 tablet 3  . cyclobenzaprine (FLEXERIL) 5 MG tablet Take 1 tablet (5 mg total) by mouth 3 (three) times daily as needed for muscle spasms. 60 tablet 1  . fluconazole (DIFLUCAN) 150 MG tablet 1 tab by mouth every 3 days as needed 2 tablet 1  . oxyCODONE-acetaminophen (PERCOCET) 7.5-325 MG tablet Take 1 tablet by mouth every 6 (six) hours as needed for severe  pain. 90 tablet 0   No current facility-administered medications on file prior to visit.    Review of Systems Constitutional: Negative for increased diaphoresis, or other activity, appetite or siginficant weight change other than noted HENT: Negative for worsening hearing loss, ear pain, facial swelling, mouth sores and neck stiffness.   Eyes: Negative for other worsening pain, redness or visual disturbance.  Respiratory: Negative for choking or stridor Cardiovascular: Negative for other chest pain and palpitations.  Gastrointestinal: Negative for worsening diarrhea, blood in stool, or abdominal distention Genitourinary: Negative for hematuria, flank pain or change in urine volume.  Musculoskeletal: Negative for myalgias or other joint complaints.  Skin: Negative for other color change and wound or drainage.  Neurological: Negative for syncope and numbness. other than noted Hematological: Negative for adenopathy. or other swelling Psychiatric/Behavioral: Negative for hallucinations, SI, self-injury, decreased concentration or other worsening agitation.  All other system neg per pt    Objective:   Physical Exam BP 130/80   Pulse (!) 112   Temp 98 F (36.7 C) (Oral)   Resp 20   Wt (!) 337 lb (152.9 kg)   SpO2 98%   BMI 47.00 kg/m  VS noted, not ill apeparing Constitutional: Pt is oriented to person, place, and time. Appears well-developed and well-nourished, in no significant distress Head: Normocephalic and atraumatic  Eyes: Conjunctivae and EOM are normal. Pupils are equal, round, and reactive to light Right Ear: External ear normal.  Left Ear: External ear normal Nose: Nose normal.  Mouth/Throat: Oropharynx is clear and moist  Neck: Normal range of motion. Neck supple. No JVD present. No tracheal deviation present or significant neck LA or mass Cardiovascular: Normal rate, regular rhythm, normal heart sounds and intact distal pulses.   Pulmonary/Chest: Effort normal and  breath sounds without rales or wheezing  Abdominal: Soft. Bowel sounds are normal. NT. No HSM  Musculoskeletal: Normal range of motion. Exhibits no edema Lymphadenopathy: Has no cervical adenopathy.  Neurological: Pt is alert and oriented to person, place, and time. Pt has normal reflexes. No cranial nerve deficit. Motor grossly intact Skin: Skin is warm and dry. No rash noted or new ulcers Psychiatric:  Has nervous mood and affect. Behavior is normal.  No other new exam findings    Assessment & Plan:

## 2016-02-08 NOTE — Assessment & Plan Note (Addendum)
Chronic persistent, ssri intolerant, for xanax prn,  to f/u any worsening symptoms or concerns  In addition to the time spent performing CPE, I spent an additional 15 minutes face to face,in which greater than 50% of this time was spent in counseling and coordination of care for patient's illness as documented.

## 2016-02-08 NOTE — Patient Instructions (Signed)
Please take all new medication as prescribed - the trazodone  Please continue all other medications as before, and refills have been done if requested - the xanax  Please have the pharmacy call with any other refills you may need.  Please continue your efforts at being more active, low cholesterol diet, and weight control.  You are otherwise up to date with prevention measures today.  Please keep your appointments with your specialists as you may have planned  Please go to the LAB in the Basement (turn left off the elevator) for the tests to be done today  You will be contacted by phone if any changes need to be made immediately.  Otherwise, you will receive a letter about your results with an explanation, but please check with MyChart first.  Please remember to sign up for MyChart if you have not done so, as this will be important to you in the future with finding out test results, communicating by private email, and scheduling acute appointments online when needed.  Please return in 1 year for your yearly visit, or sooner if needed, with Lab testing done 3-5 days before

## 2016-02-08 NOTE — Progress Notes (Signed)
Pre visit review using our clinic review tool, if applicable. No additional management support is needed unless otherwise documented below in the visit note. 

## 2016-02-08 NOTE — Assessment & Plan Note (Signed)

## 2016-02-09 ENCOUNTER — Encounter: Payer: Self-pay | Admitting: Internal Medicine

## 2016-02-09 LAB — URINALYSIS, ROUTINE W REFLEX MICROSCOPIC
Bilirubin Urine: NEGATIVE
HGB URINE DIPSTICK: NEGATIVE
KETONES UR: NEGATIVE
Leukocytes, UA: NEGATIVE
NITRITE: NEGATIVE
RBC / HPF: NONE SEEN (ref 0–?)
Total Protein, Urine: NEGATIVE
UROBILINOGEN UA: 0.2 (ref 0.0–1.0)
Urine Glucose: NEGATIVE
WBC UA: NONE SEEN (ref 0–?)
pH: 6 (ref 5.0–8.0)

## 2016-02-09 LAB — BASIC METABOLIC PANEL
CALCIUM: 9.5 mg/dL (ref 8.4–10.5)
CHLORIDE: 109 meq/L (ref 96–112)
CO2: 23 meq/L (ref 19–32)
Creatinine, Ser: 0.67 mg/dL (ref 0.40–1.20)
GFR: 110.1 mL/min (ref >60.00)
Glucose, Bld: 87 mg/dL (ref 70–99)
POTASSIUM: 4.2 meq/L (ref 3.5–5.1)
SODIUM: 139 meq/L (ref 135–145)

## 2016-02-09 LAB — HEPATIC FUNCTION PANEL
ALK PHOS: 46 U/L (ref 39–117)
ALT: 20 U/L (ref 0–35)
AST: 21 U/L (ref 0–37)
Albumin: 4.1 g/dL (ref 3.5–5.2)
BILIRUBIN DIRECT: 0.1 mg/dL (ref 0.0–0.3)
TOTAL PROTEIN: 7.6 g/dL (ref 6.0–8.3)
Total Bilirubin: 0.4 mg/dL (ref 0.2–1.2)

## 2016-02-09 LAB — LIPID PANEL
Cholesterol: 175 mg/dL (ref 0–200)
HDL: 30.9 mg/dL — ABNORMAL LOW (ref >39.00)
NONHDL: 143.61
Total CHOL/HDL Ratio: 6

## 2016-02-09 LAB — TSH: TSH: 1.32 u[IU]/mL (ref 0.35–4.50)

## 2016-02-09 LAB — HEMOGLOBIN A1C: Hgb A1c MFr Bld: 5.5 % (ref 4.6–6.5)

## 2016-02-28 ENCOUNTER — Telehealth: Payer: Self-pay | Admitting: Internal Medicine

## 2016-02-28 NOTE — Telephone Encounter (Signed)
Since this is a new problem not addressed in 2017 or 2018, please consider ROV, or try Benadryl 25-50 mg every 6 hrs prn OTC

## 2016-02-28 NOTE — Telephone Encounter (Signed)
Routing to dr john, please advise, I will call patient back, thanks 

## 2016-02-28 NOTE — Telephone Encounter (Signed)
Patient states she has hives. She states you are aware. She would like to know if there is anything she can do. Because it is getting worse. Please follow up with patient. Thank you.

## 2016-02-29 NOTE — Telephone Encounter (Signed)
Patient has already tried benadryl, she will call back to schedule office visit to see dr Jonny Ruizjohn

## 2016-08-06 ENCOUNTER — Other Ambulatory Visit: Payer: Self-pay | Admitting: Internal Medicine

## 2016-08-07 NOTE — Telephone Encounter (Signed)
Check [place] registry last filled 07/06/2016 @ [facility]...Raechel Chute/lmb

## 2016-08-08 ENCOUNTER — Ambulatory Visit (INDEPENDENT_AMBULATORY_CARE_PROVIDER_SITE_OTHER)
Admission: RE | Admit: 2016-08-08 | Discharge: 2016-08-08 | Disposition: A | Discharge status: Home / Self Care | Payer: 59 | Source: Ambulatory Visit | Attending: Family Medicine | Admitting: Family Medicine

## 2016-08-08 ENCOUNTER — Encounter: Payer: Self-pay | Admitting: Internal Medicine

## 2016-08-08 ENCOUNTER — Ambulatory Visit (INDEPENDENT_AMBULATORY_CARE_PROVIDER_SITE_OTHER): Payer: 59 | Admitting: Family Medicine

## 2016-08-08 ENCOUNTER — Ambulatory Visit (INDEPENDENT_AMBULATORY_CARE_PROVIDER_SITE_OTHER): Payer: 59 | Admitting: Internal Medicine

## 2016-08-08 VITALS — BP 130/86 | HR 95 | Ht 71.0 in | Wt 328.0 lb

## 2016-08-08 DIAGNOSIS — G8929 Other chronic pain: Secondary | ICD-10-CM

## 2016-08-08 DIAGNOSIS — M25562 Pain in left knee: Secondary | ICD-10-CM

## 2016-08-08 DIAGNOSIS — R062 Wheezing: Secondary | ICD-10-CM

## 2016-08-08 DIAGNOSIS — R059 Cough, unspecified: Secondary | ICD-10-CM | POA: Insufficient documentation

## 2016-08-08 DIAGNOSIS — R05 Cough: Secondary | ICD-10-CM | POA: Diagnosis not present

## 2016-08-08 DIAGNOSIS — M545 Low back pain: Secondary | ICD-10-CM

## 2016-08-08 DIAGNOSIS — Z0000 Encounter for general adult medical examination without abnormal findings: Secondary | ICD-10-CM | POA: Diagnosis not present

## 2016-08-08 MED ORDER — ALPRAZOLAM 1 MG PO TABS: 1.0000 mg | ORAL_TABLET | Freq: Three times a day (TID) | ORAL | 5 refills | Status: DC | PRN

## 2016-08-08 MED ORDER — TRAZODONE HCL 50 MG PO TABS: ORAL_TABLET | ORAL | 1 refills | Status: DC

## 2016-08-08 MED ORDER — LOSARTAN POTASSIUM 25 MG PO TABS
25.0000 mg | ORAL_TABLET | Freq: Every day | ORAL | 3 refills | Status: DC
Start: 2016-08-08 — End: 2017-02-19

## 2016-08-08 MED ORDER — AZITHROMYCIN 250 MG PO TABS: ORAL_TABLET | ORAL | 1 refills | Status: DC

## 2016-08-08 MED ORDER — HYDROCODONE-HOMATROPINE 5-1.5 MG/5ML PO SYRP: 5.0000 mL | ORAL_SOLUTION | Freq: Four times a day (QID) | ORAL | 0 refills | Status: AC | PRN

## 2016-08-08 MED ORDER — PREDNISONE 10 MG PO TABS: ORAL_TABLET | ORAL | 0 refills | Status: DC

## 2016-08-08 NOTE — Assessment & Plan Note (Signed)
She may have a bony contusion but has been since February with this pain. It is worse with walking to suggest that there is possibly related to her patellar tendon. Does not seem to be an intra-articular problem. - X-rays - Encouraged ice and compression therapy - Pennsaid provided  - If no improvement will follow-up and can consider referral to physical therapy versus an MRI

## 2016-08-08 NOTE — Telephone Encounter (Signed)
Faxed

## 2016-08-08 NOTE — Assessment & Plan Note (Signed)
For f/u with Dr Jordan LikesSchmitz today

## 2016-08-08 NOTE — Assessment & Plan Note (Signed)
Mild to mod, for antibx course, cough med prn, to f/u any worsening symptoms or concerns 

## 2016-08-08 NOTE — Assessment & Plan Note (Signed)
Mild to mod, for predpac asd, cont inhaler prn,  to f/u any worsening symptoms or concerns 

## 2016-08-08 NOTE — Patient Instructions (Addendum)
Please take all new medication as prescribed - the antibiotic, cough medicine and prednisone as directed  /Please continue all other medications as before, and refills have been done if requested - the losartan, trazodone and xanax  Please have the pharmacy call with any other refills you may need.  Please continue your efforts at being more active, low cholesterol diet, and weight control.  Please keep your appointments with your specialists as you may have planned  Please see Dr Jordan LikesSchmitz for lower back and left knee pain  Please return in 6 months, or sooner if needed, with Lab testing done 3-5 days before

## 2016-08-08 NOTE — Progress Notes (Signed)
Erleen A Niu - 31 y.o. female MRN [name]  Date of birth: 09/06/1985  SUBJECTIVE:  Including CC & ROS.  No chief complaint on file.  Ms. Merlinda FrederickMcLaughlin is a 31 year old female that is presenting with left knee pain. The pain is occurring on the left anterior proximal tibia. This occurred after a fall in February. She landed on her left knee on pavement. The pain is worse after she walks through the course of the day. She takes between 18,000 and 20,000 steps per day. She denies any prior surgery on her knee. She denies any locking. She does endorse some popping. She has taken meloxicam for her back but has not helped her knee. She has not tried any compression sleeve or bracing.     Review of Systems  Musculoskeletal: Positive for arthralgias. Negative for gait problem, joint swelling and myalgias.  Skin: Negative for rash.  Neurological: Negative for seizures and weakness.   otherwise negative  HISTORY: Past Medical, Surgical, Social, and Family History Reviewed & Updated per EMR.   Pertinent Historical Findings include:  Past Medical History:  Diagnosis Date  . ALLERGIC RHINITIS 10/30/2009  . ANXIETY 09/04/2006  . ASTHMA 12/25/2006   inhaler used 2 days ago  . ASTHMA, WITH ACUTE EXACERBATION 05/03/2009  . BLEPHARITIS, LEFT 05/21/2008  . Cervical disc disease    2 bulging discs to neck   . COMMON MIGRAINE 06/14/2007  . GANGLION CYST, WRIST, LEFT 10/11/2007  . GERD (gastroesophageal reflux disease) 04/24/2012  . Headache(784.0) 10/23/2008  . HYPERLIPIDEMIA 06/14/2007  . LOW BACK PAIN 09/04/2006  . Morbid obesity (HCC) 09/04/2006  . OTITIS MEDIA, ACUTE, LEFT 03/27/2008  . SINUSITIS- ACUTE-NOS 10/11/2007  . TENOSYNOVITIS, WRIST 10/11/2007  . URI 10/29/2009  . URTICARIA 05/03/2009    Past Surgical History:  Procedure Laterality Date  . back surgury  01/2003   s/p lumbar disc  . CESAREAN SECTION N/A 02/19/2013   Procedure: CESAREAN SECTION;  Surgeon: Michael LitterNaima A Dillard, MD;  Location:  [facility];  Service: Obstetrics;  Laterality: N/A;  . LAPAROSCOPY  04/07/2011   Procedure: LAPAROSCOPY OPERATIVE;  Surgeon: Meriel Picaichard M Holland, MD;  Location: [facility];  Service: Gynecology;  Laterality: N/A;  left salpingogectomy    Allergies  Allergen Reactions  . Prozac [Fluoxetine Hcl] Other (See Comments)    Family History  Problem Relation Age of Onset  . Anxiety disorder Father   . Diabetes Father      Social History   Social History  . Marital status: Married    Spouse name: N/A  . Number of children: N/A  . Years of education: N/A   Occupational History  .  [employer]    12 hour days   Social History Main Topics  . Smoking status: Current Every Day Smoker    Packs/day: 0.50    Types: Cigarettes  . Smokeless tobacco: Never Used  . Alcohol use 0.0 oz/week     Comment: rarely  . Drug use: No  . Sexual activity: Not on file   Other Topics Concern  . Not on file   Social History Narrative   Married.   2 children.   Works for [employer].   Enjoys spending time outdoors.        PHYSICAL EXAM:  VS: There were no vitals taken for this visit. Physical Exam Gen: NAD, alert, cooperative with exam, well-appearing ENT: normal lips, normal nasal mucosa,  Eye: PERRL, normal conjunctiva and lids CV:  no edema, +2 pedal pulses   Resp:  no accessory muscle use, non-labored,  Skin: no rashes, no areas of induration  Neuro: Normal tone, normal sensation to touch Psych:  normal insight, alert and oriented MSK:  Left knee: No obvious effusion. Some tenderness to palpation over the proximal lateral tibia. Some tenderness to palpation over the insertion of the patellar tendon. No significant tenderness to palpation over the medial lateral joint line or the quad tendon. Normal range of motion. Normal strength to resistance. Negative McMurray's test, negative Thessaly test. Negative Lachman's. Normal gait. Neurovascular intact.  Limited ultrasound: left knee:  No  significant effusion in the suprapatellar pouch Normal-appearing medial and lateral meniscus The insertion of the patellar tendon has a bony prominence consistent with a history of osgood schlatter  Summary: Normal exam  Ultrasound and interpretation by Clare GandyJeremy Connelly Spruell, MD            ASSESSMENT & PLAN:   Chronic pain of left knee She may have a bony contusion but has been since February with this pain. It is worse with walking to suggest that there is possibly related to her patellar tendon. Does not seem to be an intra-articular problem. - X-rays - Encouraged ice and compression therapy - Pennsaid provided  - If no improvement will follow-up and can consider referral to physical therapy versus an MRI

## 2016-08-08 NOTE — Patient Instructions (Signed)
Thank you for coming in,   We will call with the x-ray results. Please try icing this area. He can also try wearing a compression sleeve. Going to [email or web address] is a good compression sleeve. Please follow-up with me in a couple weeks if you don't have any improvement.   Please feel free to call with any questions or concerns at any time, at [phone]. --Dr. Jordan LikesSchmitz

## 2016-08-08 NOTE — Telephone Encounter (Signed)
Done hardcopy to Shirron  

## 2016-08-08 NOTE — Progress Notes (Signed)
Subjective:    Patient ID: [name], female    DOB: 04/13/1985, 31 y.o.   MRN: [id number]  HPI  Here with acute onset mild to mod 2-3 days ST, HA, general weakness and malaise, with prod cough greenish sputum, but Pt denies chest pain, increased sob or doe, wheezing, orthopnea, PND, increased LE swelling, palpitations, dizziness or syncope, except for onset mild wheezing and sob/wheezing since 1 day.    Fell in Feb to left knee, now with popping and pain daily when has stand for hours during her job; normally walks at [id number] steps per da  Pt continues to have recurring LBP without change in severity, bowel or bladder change, fever, wt loss,  worsening LE pain/numbness/weakness, gait change or falls.  Denies worsening depressive symptoms, suicidal ideation, or panic; has ongoing anxiety, not increased recently, but needs refill for xanax and trazodone Past Medical History:  Diagnosis Date  . ALLERGIC RHINITIS 10/30/2009  . ANXIETY 09/04/2006  . ASTHMA 12/25/2006   inhaler used 2 days ago  . ASTHMA, WITH ACUTE EXACERBATION 05/03/2009  . BLEPHARITIS, LEFT 05/21/2008  . Cervical disc disease    2 bulging discs to neck   . COMMON MIGRAINE 06/14/2007  . GANGLION CYST, WRIST, LEFT 10/11/2007  . GERD (gastroesophageal reflux disease) 04/24/2012  . Headache(784.0) 10/23/2008  . HYPERLIPIDEMIA 06/14/2007  . LOW BACK PAIN 09/04/2006  . Morbid obesity (HCC) 09/04/2006  . OTITIS MEDIA, ACUTE, LEFT 03/27/2008  . SINUSITIS- ACUTE-NOS 10/11/2007  . TENOSYNOVITIS, WRIST 10/11/2007  . URI 10/29/2009  . URTICARIA 05/03/2009   Past Surgical History:  Procedure Laterality Date  . back surgury  01/2003   s/p lumbar disc  . CESAREAN SECTION N/A 02/19/2013   Procedure: CESAREAN SECTION;  Surgeon: Michael LitterNaima A Dillard, MD;  Location: [facility];  Service: Obstetrics;  Laterality: N/A;  . LAPAROSCOPY  04/07/2011   Procedure: LAPAROSCOPY OPERATIVE;  Surgeon: Meriel Picaichard M Holland, MD;  Location: [facility];  Service:  Gynecology;  Laterality: N/A;  left salpingogectomy    reports that she has been smoking Cigarettes.  She has been smoking about 0.50 packs per day. She has never used smokeless tobacco. She reports that she drinks alcohol. She reports that she does not use drugs. family history includes Anxiety disorder in her father; Diabetes in her father. Allergies  Allergen Reactions  . Prozac [Fluoxetine Hcl] Other (See Comments)   Current Outpatient Prescriptions on File Prior to Visit  Medication Sig Dispense Refill  . albuterol (PROVENTIL HFA;VENTOLIN HFA) 108 (90 Base) MCG/ACT inhaler Inhale 2 puffs into the lungs every 6 (six) hours as needed. 3 Inhaler 3  . ALPRAZolam (XANAX) 1 MG tablet Take 1 tablet (1 mg total) by mouth 3 (three) times daily as needed for anxiety. 90 tablet 5  . beclomethasone (QVAR) 80 MCG/ACT inhaler Inhale 2 puffs into the lungs 2 (two) times daily. 1 Inhaler 12  . cetirizine (ZYRTEC) 10 MG tablet Take 1 tablet (10 mg total) by mouth daily. 90 tablet 3  . cyclobenzaprine (FLEXERIL) 5 MG tablet Take 1 tablet (5 mg total) by mouth 3 (three) times daily as needed for muscle spasms. 60 tablet 1  . fluconazole (DIFLUCAN) 150 MG tablet 1 tab by mouth every 3 days as needed 2 tablet 1  . losartan (COZAAR) 25 MG tablet Take 1 tablet (25 mg total) by mouth daily. 90 tablet 3  . meloxicam (MOBIC) 15 MG tablet Take 1 tablet (15 mg total) by mouth daily. 90 tablet 2  .  oxyCODONE-acetaminophen (PERCOCET) 7.5-325 MG tablet Take 1 tablet by mouth every 6 (six) hours as needed for severe pain. 90 tablet 0  . traZODone (DESYREL) 50 MG tablet TAKE ONE-HALF TO 1 TABLET BY MOUTH AT BEDTIME AS NEEDED FOR SLEEP 90 tablet 0   No current facility-administered medications on file prior to visit.     Review of Systems Constitutional: Negative for other unusual diaphoresis, sweats, appetite or weight changes HENT: Negative for other worsening hearing loss, ear pain, facial swelling, mouth sores or  neck stiffness.   Eyes: Negative for other worsening pain, redness or other visual disturbance.  Respiratory: Negative for other stridor or swelling Cardiovascular: Negative for other palpitations or other chest pain  Gastrointestinal: Negative for worsening diarrhea or loose stools, blood in stool, distention or other pain Genitourinary: Negative for hematuria, flank pain or other change in urine volume.  Musculoskeletal: Negative for myalgias or other joint swelling.  Skin: Negative for other color change, or other wound or worsening drainage.  Neurological: Negative for other syncope or numbness. Hematological: Negative for other adenopathy or swelling Psychiatric/Behavioral: Negative for hallucinations, other worsening agitation, SI, self-injury, or new decreased concentration All other system neg per pt    Objective:   Physical Exam BP 130/86   Pulse 95   Ht 5\' 11"  (1.803 m)   Wt (!) 328 lb (148.8 kg)   SpO2 98%   BMI 45.75 kg/m  VS noted, mild ill Constitutional: Pt appears in NAD HENT: Head: NCAT.  Right Ear: External ear normal.  Left Ear: External ear normal.  Eyes: . Pupils are equal, round, and reactive to light. Conjunctivae and EOM are normal Nose: without d/c or deformity Bilat tm's with mild erythema.  Max sinus areas non tender.  Pharynx with mild erythema, no exudate Neck: Neck supple. Gross normal ROM Cardiovascular: Normal rate and regular rhythm.   Pulmonary/Chest: Effort normal and breath sounds decresaed without rales obut with mild wheezing.  Abd:  Soft, NT, ND, + BS, no organomegaly Left knee with trace effusion, mild medial tender, decresaed ROM Neurological: Pt is alert. At baseline orientation, motor grossly intact but tender low mid lumbar in midline, o/w not done in detail Skin: Skin is warm. No rashes, other new lesions, no LE edema Psychiatric: Pt behavior is normal without agitation  No other exam findings     Assessment & Plan:

## 2016-08-09 ENCOUNTER — Telehealth: Payer: Self-pay | Admitting: Family Medicine

## 2016-08-09 NOTE — Telephone Encounter (Signed)
Spoke with patient about her x-ray results. She'll try patellar strap and follow-up as needed.  Myra RudeSchmitz, Suzann Lazaro E, MD [facility] 08/09/2016, 2:00 PM

## 2016-10-23 ENCOUNTER — Telehealth: Payer: Self-pay | Admitting: Internal Medicine

## 2016-10-23 MED ORDER — FLUCONAZOLE 150 MG PO TABS: ORAL_TABLET | ORAL | 1 refills | Status: DC

## 2016-10-23 NOTE — Telephone Encounter (Signed)
Patient had a cysts removed at urgent care and was given antibiotics, she now has a yeast infection and wanted Dr. Jonny Ruiz to call in the little pill he normally does for her yeast infections, she states it always clears it up.  Please advise and call back.

## 2016-10-23 NOTE — Telephone Encounter (Signed)
Diflucan done erx 

## 2017-01-16 ENCOUNTER — Ambulatory Visit
Admission: EM | Admit: 2017-01-16 | Discharge: 2017-01-16 | Disposition: A | Discharge status: Home / Self Care | Payer: 59 | Attending: Family Medicine | Admitting: Family Medicine

## 2017-01-16 ENCOUNTER — Other Ambulatory Visit: Payer: Self-pay

## 2017-01-16 ENCOUNTER — Encounter: Payer: Self-pay | Admitting: *Deleted

## 2017-01-16 DIAGNOSIS — B379 Candidiasis, unspecified: Secondary | ICD-10-CM | POA: Diagnosis not present

## 2017-01-16 DIAGNOSIS — B373 Candidiasis of vulva and vagina: Secondary | ICD-10-CM | POA: Diagnosis not present

## 2017-01-16 LAB — WET PREP, GENITAL
CLUE CELLS WET PREP: NONE SEEN
Sperm: NONE SEEN
TRICH WET PREP: NONE SEEN

## 2017-01-16 LAB — URINALYSIS, COMPLETE (UACMP) WITH MICROSCOPIC
BACTERIA UA: NONE SEEN
Bilirubin Urine: NEGATIVE
Glucose, UA: NEGATIVE mg/dL
Hgb urine dipstick: NEGATIVE
Ketones, ur: NEGATIVE mg/dL
LEUKOCYTES UA: NEGATIVE
Nitrite: NEGATIVE
PROTEIN: NEGATIVE mg/dL
RBC / HPF: NONE SEEN RBC/hpf (ref 0–5)
Specific Gravity, Urine: 1.015 (ref 1.005–1.030)
pH: 7 (ref 5.0–8.0)

## 2017-01-16 MED ORDER — FLUCONAZOLE 150 MG PO TABS: 150.0000 mg | ORAL_TABLET | Freq: Once | ORAL | 1 refills | Status: AC

## 2017-01-16 NOTE — Discharge Instructions (Signed)
Medication as prescribed.  We will call if you urine culture is positive.  Take care  Dr. Adriana Simasook

## 2017-01-16 NOTE — ED Triage Notes (Signed)
PAtient started having symptoms of urinary frequency 6 days ago.

## 2017-01-16 NOTE — ED Provider Notes (Signed)
[facility]    CSN: [id number] Arrival date & time: 01/16/17  0919  History   Chief Complaint Chief Complaint  Patient presents with  . Urinary Frequency   HPI  32 year old female presents with urinary frequency, dysuria, vaginal discharge.  Patient reports that she has had urinary frequency, stinging/sharp pain with urination, and vaginal discharge for the past 6 days.  She also reports some pain in her perineal region.  She has taken Azo and Monistat as well as 1 dose of Diflucan without improvement.  She reports that her discharge is white/thick.  No known exacerbating factors.  She also reports some lower abdominal pain.  No other associated symptoms.  No other complaints or concerns at this time.  Past Medical History:  Diagnosis Date  . ALLERGIC RHINITIS 10/30/2009  . ANXIETY 09/04/2006  . ASTHMA 12/25/2006   inhaler used 2 days ago  . ASTHMA, WITH ACUTE EXACERBATION 05/03/2009  . BLEPHARITIS, LEFT 05/21/2008  . Cervical disc disease    2 bulging discs to neck   . COMMON MIGRAINE 06/14/2007  . GANGLION CYST, WRIST, LEFT 10/11/2007  . GERD (gastroesophageal reflux disease) 04/24/2012  . Headache(784.0) 10/23/2008  . HYPERLIPIDEMIA 06/14/2007  . LOW BACK PAIN 09/04/2006  . Morbid obesity (HCC) 09/04/2006  . OTITIS MEDIA, ACUTE, LEFT 03/27/2008  . SINUSITIS- ACUTE-NOS 10/11/2007  . TENOSYNOVITIS, WRIST 10/11/2007  . URI 10/29/2009  . URTICARIA 05/03/2009   Patient Active Problem List   Diagnosis Date Noted  . Cough 08/08/2016  . Chronic pain of left knee 08/08/2016  . Insomnia 02/08/2016  . Acute left lumbar radiculopathy 09/16/2015  . Hyperglycemia 08/13/2015  . GERD (gastroesophageal reflux disease) 04/24/2012  . Wheezing 04/17/2011  . Preventative health care 04/13/2010  . Allergic rhinitis 10/30/2009  . Acute upper respiratory infection 10/29/2009  . DISC DISEASE, CERVICAL 10/29/2009  . Hives 05/03/2009  . Essential hypertension 10/11/2007  . HYPERLIPIDEMIA  06/14/2007  . Asthma 12/25/2006  . Morbid obesity (HCC) 09/04/2006  . Generalized anxiety disorder 09/04/2006  . Anxiety with depression 09/04/2006  . LOW BACK PAIN 09/04/2006   Past Surgical History:  Procedure Laterality Date  . back surgury  01/2003   s/p lumbar disc  . CESAREAN SECTION N/A 02/19/2013   Procedure: CESAREAN SECTION;  Surgeon: Michael LitterNaima A Dillard, MD;  Location: [facility];  Service: Obstetrics;  Laterality: N/A;  . LAPAROSCOPY  04/07/2011   Procedure: LAPAROSCOPY OPERATIVE;  Surgeon: Meriel Picaichard M Holland, MD;  Location: [facility];  Service: Gynecology;  Laterality: N/A;  left salpingogectomy   OB History    Gravida Para Term Preterm AB Living   3 1 1   2 1    SAB TAB Ectopic Multiple Live Births   1   1   1      Home Medications    Prior to Admission medications   Medication Sig Start Date End Date Taking? Authorizing Provider  albuterol (PROVENTIL HFA;VENTOLIN HFA) 108 (90 Base) MCG/ACT inhaler Inhale 2 puffs into the lungs every 6 (six) hours as needed. 08/13/15  Yes Corwin LevinsJohn, James W, MD  ALPRAZolam Prudy Feeler(XANAX) 1 MG tablet Take 1 tablet (1 mg total) by mouth 3 (three) times daily as needed for anxiety. 08/08/16  Yes Corwin LevinsJohn, James W, MD  losartan (COZAAR) 25 MG tablet Take 1 tablet (25 mg total) by mouth daily. 08/08/16  Yes Corwin LevinsJohn, James W, MD  traZODone (DESYREL) 50 MG tablet TAKE ONE-HALF TO 1 TABLET BY MOUTH AT BEDTIME AS NEEDED FOR SLEEP 08/08/16  Yes John,  Len Blalock, MD  beclomethasone (QVAR) 80 MCG/ACT inhaler Inhale 2 puffs into the lungs 2 (two) times daily. 08/13/15   Corwin Levins, MD  fluconazole (DIFLUCAN) 150 MG tablet Take 1 tablet (150 mg total) by mouth once for 1 dose. Additional dose in 72 hours. 01/16/17 01/16/17  Tommie Sams, DO    Family History Family History  Problem Relation Age of Onset  . Anxiety disorder Father   . Diabetes Father     Social History Social History   Tobacco Use  . Smoking status: Current Every Day Smoker    Packs/day: 0.50    Types: Cigarettes    . Smokeless tobacco: Never Used  Substance Use Topics  . Alcohol use: Yes    Alcohol/week: 0.0 oz    Comment: rarely  . Drug use: No   Allergies   Prozac [fluoxetine hcl]  Review of Systems Review of Systems  Constitutional: Negative.   Genitourinary: Positive for dysuria, frequency, pelvic pain and vaginal discharge.   Physical Exam Triage Vital Signs ED Triage Vitals  Enc Vitals Group     BP 01/16/17 0936 (!) 142/75     Pulse Rate 01/16/17 0936 94     Resp 01/16/17 0936 16     Temp 01/16/17 0936 98.7 F (37.1 C)     Temp Source 01/16/17 0936 Oral     SpO2 01/16/17 0936 100 %     Weight 01/16/17 0936 286 lb (129.7 kg)     Height 01/16/17 0936 5\' 11"  (1.803 m)     Head Circumference --      Peak Flow --      Pain Score 01/16/17 0937 9     Pain Loc --      Pain Edu? --      Excl. in GC? --    Updated Vital Signs BP (!) 142/75 (BP Location: Left Arm)   Pulse 94   Temp 98.7 F (37.1 C) (Oral)   Resp 16   Ht 5\' 11"  (1.803 m)   Wt 286 lb (129.7 kg)   SpO2 100%   BMI 39.89 kg/m      Physical Exam  Constitutional: She is oriented to person, place, and time. She appears well-developed and well-nourished. No distress.  HENT:  Head: Normocephalic and atraumatic.  Nose: Nose normal.  Cardiovascular: Normal rate and regular rhythm.  Pulmonary/Chest: Effort normal and breath sounds normal.  Abdominal: Soft. She exhibits no distension.  Tender to palpation in the lower abdomen.  Genitourinary:  Genitourinary Comments: Pelvic Exam: External: normal female genitalia without lesions or masses Vagina: large amount of thick, white discharge. Cervix: normal without lesions or masses. Sample for Wet prep obtained.   Neurological: She is alert and oriented to person, place, and time.  Psychiatric: She has a normal mood and affect. Her behavior is normal.  Nursing note and vitals reviewed.  UC Treatments / Results  Labs (all labs ordered are listed, but only abnormal  results are displayed) Labs Reviewed  WET PREP, GENITAL - Abnormal; Notable for the following components:      Result Value   Yeast Wet Prep HPF POC PRESENT (*)    WBC, Wet Prep HPF POC FEW (*)    All other components within normal limits  URINALYSIS, COMPLETE (UACMP) WITH MICROSCOPIC - Abnormal; Notable for the following components:   Squamous Epithelial / LPF 0-5 (*)    All other components within normal limits  URINE CULTURE    EKG  EKG  Interpretation None       Radiology No results found.  Procedures Procedures (including critical care time)  Medications Ordered in UC Medications - No data to display   Initial Impression / Assessment and Plan / UC Course  I have reviewed the triage vital signs and the nursing notes.  Pertinent labs & imaging results that were available during my care of the patient were reviewed by me and considered in my medical decision making (see chart for details).     32 year old female presents with urinary symptoms as well as vaginal discharge.  Urinalysis negative.  Sending culture.  Wet prep with yeast.  Treating with Diflucan.  Final Clinical Impressions(s) / UC Diagnoses   Final diagnoses:  Yeast infection    ED Discharge Orders        Ordered    fluconazole (DIFLUCAN) 150 MG tablet   Once     01/16/17 1027     Controlled Substance Prescriptions [place] Controlled Substance Registry consulted? Not Applicable   Tommie Sams, DO 01/16/17 1044

## 2017-01-17 LAB — URINE CULTURE

## 2017-02-06 ENCOUNTER — Other Ambulatory Visit: Payer: Self-pay | Admitting: Internal Medicine

## 2017-02-07 NOTE — Telephone Encounter (Signed)
Done erx 

## 2017-02-19 ENCOUNTER — Ambulatory Visit: Payer: 59 | Admitting: Internal Medicine

## 2017-02-19 ENCOUNTER — Other Ambulatory Visit (INDEPENDENT_AMBULATORY_CARE_PROVIDER_SITE_OTHER): Payer: 59

## 2017-02-19 ENCOUNTER — Encounter: Payer: Self-pay | Admitting: Internal Medicine

## 2017-02-19 VITALS — BP 122/82 | HR 89 | Temp 98.3°F | Ht 71.0 in | Wt 279.0 lb

## 2017-02-19 DIAGNOSIS — R062 Wheezing: Secondary | ICD-10-CM | POA: Diagnosis not present

## 2017-02-19 DIAGNOSIS — R05 Cough: Secondary | ICD-10-CM | POA: Diagnosis not present

## 2017-02-19 DIAGNOSIS — R739 Hyperglycemia, unspecified: Secondary | ICD-10-CM | POA: Diagnosis not present

## 2017-02-19 DIAGNOSIS — J45901 Unspecified asthma with (acute) exacerbation: Secondary | ICD-10-CM | POA: Diagnosis not present

## 2017-02-19 DIAGNOSIS — Z202 Contact with and (suspected) exposure to infections with a predominantly sexual mode of transmission: Secondary | ICD-10-CM | POA: Insufficient documentation

## 2017-02-19 DIAGNOSIS — Z23 Encounter for immunization: Secondary | ICD-10-CM | POA: Diagnosis not present

## 2017-02-19 DIAGNOSIS — Z0001 Encounter for general adult medical examination with abnormal findings: Secondary | ICD-10-CM

## 2017-02-19 DIAGNOSIS — R059 Cough, unspecified: Secondary | ICD-10-CM

## 2017-02-19 LAB — BASIC METABOLIC PANEL
BUN: 10 mg/dL (ref 6–23)
CHLORIDE: 106 meq/L (ref 96–112)
CO2: 24 meq/L (ref 19–32)
Calcium: 9.7 mg/dL (ref 8.4–10.5)
Creatinine, Ser: 0.61 mg/dL (ref 0.40–1.20)
GFR: 121.84 mL/min (ref >60.00)
Glucose, Bld: 90 mg/dL (ref 70–99)
POTASSIUM: 3.9 meq/L (ref 3.5–5.1)
Sodium: 139 mEq/L (ref 135–145)

## 2017-02-19 LAB — HEPATIC FUNCTION PANEL
ALBUMIN: 4.5 g/dL (ref 3.5–5.2)
ALT: 17 U/L (ref 0–35)
AST: 12 U/L (ref 0–37)
Alkaline Phosphatase: 44 U/L (ref 39–117)
Bilirubin, Direct: 0.1 mg/dL (ref 0.0–0.3)
Total Bilirubin: 0.5 mg/dL (ref 0.2–1.2)
Total Protein: 7.6 g/dL (ref 6.0–8.3)

## 2017-02-19 LAB — CBC WITH DIFFERENTIAL/PLATELET
Basophils Absolute: 0.1 10*3/uL (ref 0.0–0.1)
Basophils Relative: 0.5 % (ref 0.0–3.0)
EOS PCT: 2.3 % (ref 0.0–5.0)
Eosinophils Absolute: 0.3 10*3/uL (ref 0.0–0.7)
HCT: 45.6 % (ref 36.0–46.0)
Hemoglobin: 15.5 g/dL — ABNORMAL HIGH (ref 12.0–15.0)
LYMPHS ABS: 3.8 10*3/uL (ref 0.7–4.0)
Lymphocytes Relative: 32.4 % (ref 12.0–46.0)
MCHC: 34.1 g/dL (ref 30.0–36.0)
MCV: 93 fl (ref 78.0–100.0)
MONO ABS: 0.6 10*3/uL (ref 0.1–1.0)
Monocytes Relative: 5.2 % (ref 3.0–12.0)
NEUTROS PCT: 59.6 % (ref 43.0–77.0)
Neutro Abs: 7 10*3/uL (ref 1.4–7.7)
Platelets: 236 10*3/uL (ref 150.0–400.0)
RBC: 4.9 Mil/uL (ref 3.87–5.11)
RDW: 12.8 % (ref 11.5–15.5)
WBC: 11.8 10*3/uL — ABNORMAL HIGH (ref 4.0–10.5)

## 2017-02-19 LAB — URINALYSIS, ROUTINE W REFLEX MICROSCOPIC
Bilirubin Urine: NEGATIVE
HGB URINE DIPSTICK: NEGATIVE
Ketones, ur: NEGATIVE
Leukocytes, UA: NEGATIVE
NITRITE: NEGATIVE
RBC / HPF: NONE SEEN (ref 0–?)
Specific Gravity, Urine: 1.01 (ref 1.000–1.030)
TOTAL PROTEIN, URINE-UPE24: NEGATIVE
URINE GLUCOSE: NEGATIVE
UROBILINOGEN UA: 0.2 (ref 0.0–1.0)
pH: 7 (ref 5.0–8.0)

## 2017-02-19 LAB — LIPID PANEL
CHOL/HDL RATIO: 4
Cholesterol: 131 mg/dL (ref 0–200)
HDL: 37.1 mg/dL — ABNORMAL LOW (ref >39.00)
LDL Cholesterol: 75 mg/dL (ref 0–99)
NonHDL: 94.37
TRIGLYCERIDES: 96 mg/dL (ref 0.0–149.0)
VLDL: 19.2 mg/dL (ref 0.0–40.0)

## 2017-02-19 LAB — HEMOGLOBIN A1C: Hgb A1c MFr Bld: 5.2 % (ref 4.6–6.5)

## 2017-02-19 LAB — TSH: TSH: 1.21 u[IU]/mL (ref 0.35–4.50)

## 2017-02-19 MED ORDER — LOSARTAN POTASSIUM 25 MG PO TABS: 25.0000 mg | ORAL_TABLET | Freq: Every day | ORAL | 3 refills | Status: DC

## 2017-02-19 MED ORDER — ALBUTEROL SULFATE HFA 108 (90 BASE) MCG/ACT IN AERS: 2.0000 | INHALATION_SPRAY | Freq: Four times a day (QID) | RESPIRATORY_TRACT | 3 refills | Status: DC | PRN

## 2017-02-19 MED ORDER — PREDNISONE 10 MG PO TABS: ORAL_TABLET | ORAL | 0 refills | Status: DC

## 2017-02-19 MED ORDER — CEPHALEXIN 500 MG PO CAPS: 500.0000 mg | ORAL_CAPSULE | Freq: Three times a day (TID) | ORAL | 0 refills | Status: AC

## 2017-02-19 MED ORDER — HYDROCODONE-HOMATROPINE 5-1.5 MG/5ML PO SYRP: 5.0000 mL | ORAL_SOLUTION | Freq: Four times a day (QID) | ORAL | 0 refills | Status: AC | PRN

## 2017-02-19 MED ORDER — METHYLPREDNISOLONE ACETATE 80 MG/ML IJ SUSP
80.0000 mg | Freq: Once | INTRAMUSCULAR | Status: AC
  Administered 2017-02-19: 80 mg via INTRAMUSCULAR

## 2017-02-19 NOTE — Assessment & Plan Note (Signed)
Lab Results  Component Value Date   HGBA1C 5.2 02/19/2017  stable overall by history and exam, recent data reviewed with pt, and pt to continue medical treatment as before,  to f/u any worsening symptoms or concerns

## 2017-02-19 NOTE — Assessment & Plan Note (Signed)
Mild to mod, for depomedrol IM 80, predpac asd, inhaler refill, to f/u any worsening symptoms or concerns

## 2017-02-19 NOTE — Assessment & Plan Note (Signed)
Mild to mod, for antibx course,  to f/u any worsening symptoms or concerns 

## 2017-02-19 NOTE — Progress Notes (Signed)
Subjective:    Patient ID: Martha Thompson, female    DOB: 1985-04-13, 32 y.o.   MRN: [id number]  HPI    /Here for wellness and f/u;  Overall doing ok;  Pt denies Chest pain, orthopnea, PND, worsening LE edema, palpitations, dizziness or syncope.  Pt denies neurological change such as new headache, facial or extremity weakness.  Pt denies polydipsia, polyuria, or low sugar symptoms. Pt states overall good compliance with treatment and medications, good tolerability, and has been trying to follow appropriate diet.  Pt denies worsening depressive symptoms, suicidal ideation or panic. No night sweats, wt loss, loss of appetite, or other constitutional symptoms.  Pt states good ability with ADL's, has low fall risk, home safety reviewed and adequate, no other significant changes in hearing or vision, and not active with exercise, though has lost significant wt loss she attributes to recent marital stress and possible husband infidelity.  Is very interested in STD testing.  Did have GU symptoms last month for several wks now improved vagintis and urinary retention symptoms. Concepcion Elk helped.  UA and cx neg at [facility] evaluation. Also with hair loss and wt loss with recent marital stress., almost 50 lbs. Wt Readings from Last 3 Encounters:  02/19/17 279 lb (126.6 kg)  01/16/17 286 lb (129.7 kg)  08/08/16 (!) 328 lb (148.8 kg)  Also incidentally before Here with acute onset mild to mod 2-3 days ST, HA, general weakness and malaise, with prod cough greenish sputum, but Pt denies chest pain orthopnea, PND, increased LE swelling, palpitations, dizziness or syncope assoc with mild sob/doe/wheezing x 2 days. Using inhaler with much increased frequency Past Medical History:  Diagnosis Date  . ALLERGIC RHINITIS 10/30/2009  . ANXIETY 09/04/2006  . ASTHMA 12/25/2006   inhaler used 2 days ago  . ASTHMA, WITH ACUTE EXACERBATION 05/03/2009  . BLEPHARITIS, LEFT 05/21/2008  . Cervical disc disease    2 bulging discs to  neck   . COMMON MIGRAINE 06/14/2007  . GANGLION CYST, WRIST, LEFT 10/11/2007  . GERD (gastroesophageal reflux disease) 04/24/2012  . Headache(784.0) 10/23/2008  . HYPERLIPIDEMIA 06/14/2007  . LOW BACK PAIN 09/04/2006  . Morbid obesity (HCC) 09/04/2006  . OTITIS MEDIA, ACUTE, LEFT 03/27/2008  . SINUSITIS- ACUTE-NOS 10/11/2007  . TENOSYNOVITIS, WRIST 10/11/2007  . URI 10/29/2009  . URTICARIA 05/03/2009   Past Surgical History:  Procedure Laterality Date  . back surgury  01/2003   s/p lumbar disc  . CESAREAN SECTION N/A 02/19/2013   Procedure: CESAREAN SECTION;  Surgeon: Michael Litter, MD;  Location: [facility];  Service: Obstetrics;  Laterality: N/A;  . LAPAROSCOPY  04/07/2011   Procedure: LAPAROSCOPY OPERATIVE;  Surgeon: Meriel Pica, MD;  Location: [facility];  Service: Gynecology;  Laterality: N/A;  left salpingogectomy    reports that she has been smoking cigarettes.  She has been smoking about 0.50 packs per day. she has never used smokeless tobacco. She reports that she drinks alcohol. She reports that she does not use drugs. family history includes Anxiety disorder in her father; Diabetes in her father. Allergies  Allergen Reactions  . Prozac [Fluoxetine Hcl] Other (See Comments)   Current Outpatient Medications on File Prior to Visit  Medication Sig Dispense Refill  . ALPRAZolam (XANAX) 1 MG tablet TAKE ONE TABLET BY MOUTH THREE TIMES DAILY AS NEEDED FOR ANXIETY 90 tablet 2  . beclomethasone (QVAR) 80 MCG/ACT inhaler Inhale 2 puffs into the lungs 2 (two) times daily. 1 Inhaler 12  .  traZODone (DESYREL) 50 MG tablet TAKE ONE-HALF TO 1 TABLET BY MOUTH AT BEDTIME AS NEEDED FOR SLEEP 90 tablet 1   No current facility-administered medications on file prior to visit.    Review of Systems Constitutional: Negative for other unusual diaphoresis, sweats, appetite or weight changes HENT: Negative for other worsening hearing loss, ear pain, facial swelling, mouth sores or neck stiffness.   Eyes:  Negative for other worsening pain, redness or other visual disturbance.  Respiratory: Negative for other stridor or swelling Cardiovascular: Negative for other palpitations or other chest pain  Gastrointestinal: Negative for worsening diarrhea or loose stools, blood in stool, distention or other pain Genitourinary: Negative for hematuria, flank pain or other change in urine volume.  Musculoskeletal: Negative for myalgias or other joint swelling.  Skin: Negative for other color change, or other wound or worsening drainage.  Neurological: Negative for other syncope or numbness. Hematological: Negative for other adenopathy or swelling Psychiatric/Behavioral: Negative for hallucinations, other worsening agitation, SI, self-injury, or new decreased concentration All other system neg per pt    Objective:   Physical Exam BP 122/82   Pulse 89   Temp 98.3 F (36.8 C) (Oral)   Ht 5\' 11"  (1.803 m)   Wt 279 lb (126.6 kg)   SpO2 96%   BMI 38.91 kg/m  VS noted, morbid obese, mild ill Constitutional: Pt is oriented to person, place, and time. Appears well-developed and well-nourished, in no significant distress and comfortable Head: Normocephalic and atraumatic  Eyes: Conjunctivae and EOM are normal. Pupils are equal, round, and reactive to light Bilat tm's with mild erythema.  Max sinus areas non tender.  Pharynx with mild erythema, no exudate Right Ear: External ear normal without discharge Left Ear: External ear normal without discharge Nose: Nose without discharge or deformity Mouth/Throat: Oropharynx is without other ulcerations and moist  Neck: Normal range of motion. Neck supple. No JVD present. No tracheal deviation present or significant neck LA or mass Cardiovascular: Normal rate, regular rhythm, normal heart sounds and intact distal pulses.   Pulmonary/Chest: WOB normal and breath sounds decreased without rales but with bilat scattered wheezing  Abdominal: Soft. Bowel sounds are  normal. NT. No HSM  Musculoskeletal: Normal range of motion. Exhibits no edema Lymphadenopathy: Has no other cervical adenopathy.  Neurological: Pt is alert and oriented to person, place, and time. Pt has normal reflexes. No cranial nerve deficit. Motor grossly intact, Gait intact Skin: Skin is warm and dry. No rash noted or new ulcerations Psychiatric:  Has mild nervous mood and affect. Behavior is normal without agitation No other exam findings Lab Results  Component Value Date   WBC 11.8 (H) 02/19/2017   HGB 15.5 (H) 02/19/2017   HCT 45.6 02/19/2017   PLT 236.0 02/19/2017   GLUCOSE 90 02/19/2017   CHOL 131 02/19/2017   TRIG 96.0 02/19/2017   HDL 37.10 (L) 02/19/2017   LDLCALC 75 02/19/2017   ALT 17 02/19/2017   AST 12 02/19/2017   NA 139 02/19/2017   K 3.9 02/19/2017   CL 106 02/19/2017   CREATININE 0.61 02/19/2017   BUN 10 02/19/2017   CO2 24 02/19/2017   TSH 1.21 02/19/2017   HGBA1C 5.2 02/19/2017       Assessment & Plan:

## 2017-02-19 NOTE — Assessment & Plan Note (Addendum)
Mild to mod, c/w bronchitis vs pna, declines cxr,  for antibx course, cough med prn,  to f/u any worsening symptoms or concerns  In addition to the time spent performing CPE, I spent an additional 25 minutes face to face,in which greater than 50% of this time was spent in counseling and coordination of care for patient's acute illness as documented, including the differential dx, treatment, further evaluation and other management of cough, wheezing, DM, HTN and STD exposure

## 2017-02-19 NOTE — Patient Instructions (Signed)
You had the Tdap Tetanus shot today, and the steroid shot  Please take all new medication as prescribed - the antibiotic, cough medicine, and prednisone  Please continue all other medications as before, and refills have been done if requested.  Please have the pharmacy call with any other refills you may need.  Please continue your efforts at being more active, low cholesterol diet, and weight control.  You are otherwise up to date with prevention measures today.  Please keep your appointments with your specialists as you may have planned  Please go to the LAB in the Basement (turn left off the elevator) for the tests to be done today  You will be contacted by phone if any changes need to be made immediately.  Otherwise, you will receive a letter about your results with an explanation, but please check with MyChart first.  Please remember to sign up for MyChart if you have not done so, as this will be important to you in the future with finding out test results, communicating by private email, and scheduling acute appointments online when needed.  Please return in 1 year for your yearly visit, or sooner if needed, with Lab testing done 3-5 days before

## 2017-02-20 LAB — HSV 2 ANTIBODY, IGG: HSV 2 Glycoprotein G Ab, IgG: <0.9 index

## 2017-02-20 LAB — RPR: RPR: NONREACTIVE

## 2017-02-20 LAB — HIV ANTIBODY (ROUTINE TESTING W REFLEX): HIV: NONREACTIVE

## 2017-02-26 ENCOUNTER — Telehealth: Payer: Self-pay | Admitting: Internal Medicine

## 2017-02-26 MED ORDER — FLUCONAZOLE 150 MG PO TABS: ORAL_TABLET | ORAL | 1 refills | Status: DC

## 2017-02-26 NOTE — Telephone Encounter (Signed)
I though a rx was given at last visit.  Please let me know if I did not do this, or it was incorrect in some way, Thanks

## 2017-02-26 NOTE — Telephone Encounter (Signed)
It is nothing listed in her med list for a yeast infection from the day of her OV. Please advise.

## 2017-02-26 NOTE — Telephone Encounter (Signed)
Copied from CRM [id number]. Topic: General - Other >> Feb 26, 2017 12:00 PM Cecelia ByarsGreen, Rojelio Uhrich L, RMA wrote: Reason for CRM: Patient is requesting a callback from Dr. Jonny RuizJohn CMA concerning a prescription for a yeast infection due to current antibiotics she is taking

## 2017-02-26 NOTE — Addendum Note (Signed)
Addended by: Corwin LevinsJOHN, Kimber Fritts W on: 02/26/2017 06:38 PM   Modules accepted: Orders

## 2017-02-26 NOTE — Telephone Encounter (Signed)
Done erx 

## 2017-05-06 ENCOUNTER — Other Ambulatory Visit: Payer: Self-pay | Admitting: Internal Medicine

## 2017-05-07 NOTE — Telephone Encounter (Signed)
Xanax 04/07/2017 90#

## 2017-05-07 NOTE — Telephone Encounter (Signed)
Done erx 

## 2017-08-10 ENCOUNTER — Other Ambulatory Visit: Payer: Self-pay | Admitting: Internal Medicine

## 2017-08-13 NOTE — Telephone Encounter (Signed)
Done erx 

## 2017-10-26 ENCOUNTER — Telehealth: Payer: Self-pay | Admitting: Internal Medicine

## 2017-10-26 NOTE — Telephone Encounter (Signed)
Sorry, I have not seen

## 2017-10-26 NOTE — Telephone Encounter (Signed)
This form is patients CPE completion form for her employer.  Patient is requesting this form to be faxed back to the number of the bottom of the form.  Patient is requesting a call back once this has been completed.

## 2017-10-26 NOTE — Telephone Encounter (Signed)
Copied from CRM [id number]. Topic: General - Other >> Oct 26, 2017  1:12 PM Gerrianne Scale wrote: Reason for CRM: Pt calling stating that she has a form that need to be filled out for her job and that she was going to fax it to Dr Jonny Ruiz today and if she could get a call back when the form has been completed

## 2017-10-29 NOTE — Telephone Encounter (Signed)
Patient will refax this evening (10/21) or in the morning (10/22).  Let me know if you do not get.  Thanks!

## 2017-10-30 NOTE — Telephone Encounter (Signed)
Please give patient a call once form has been completed and faxed in.

## 2017-10-30 NOTE — Telephone Encounter (Signed)
I have received the form. I will have it filled out and ready for PCP to sign once he returns. Form states that it is due by 01/08/18.

## 2017-10-30 NOTE — Telephone Encounter (Signed)
I have informed patient  °

## 2017-11-03 ENCOUNTER — Other Ambulatory Visit: Payer: Self-pay | Admitting: Internal Medicine

## 2017-11-05 ENCOUNTER — Other Ambulatory Visit: Payer: Self-pay | Admitting: Internal Medicine

## 2017-11-07 ENCOUNTER — Other Ambulatory Visit: Payer: Self-pay | Admitting: Internal Medicine

## 2017-11-12 ENCOUNTER — Telehealth: Payer: Self-pay | Admitting: Internal Medicine

## 2017-11-12 ENCOUNTER — Other Ambulatory Visit: Payer: Self-pay | Admitting: Internal Medicine

## 2017-11-12 NOTE — Telephone Encounter (Signed)
Has already been sent to PCP for review

## 2017-11-12 NOTE — Telephone Encounter (Signed)
Copied from CRM [id number]. Topic: General - Other >> Nov 12, 2017  1:08 PM Leafy Ro wrote: Reason for CRM: pt is out of alprazolam. Pt has contacted [facility] in [place] on  [address]

## 2017-11-12 NOTE — Telephone Encounter (Signed)
Done erx 

## 2017-11-13 NOTE — Telephone Encounter (Signed)
Form has been faxed. Pt informed.

## 2018-02-14 ENCOUNTER — Other Ambulatory Visit: Payer: Self-pay | Admitting: Internal Medicine

## 2018-02-15 NOTE — Telephone Encounter (Signed)
Done erx 

## 2018-02-22 ENCOUNTER — Other Ambulatory Visit (INDEPENDENT_AMBULATORY_CARE_PROVIDER_SITE_OTHER): Payer: 59

## 2018-02-22 ENCOUNTER — Ambulatory Visit: Payer: 59 | Admitting: Internal Medicine

## 2018-02-22 ENCOUNTER — Encounter: Payer: Self-pay | Admitting: Internal Medicine

## 2018-02-22 VITALS — BP 126/82 | HR 59 | Temp 98.0°F | Ht 71.0 in | Wt 314.0 lb

## 2018-02-22 DIAGNOSIS — R739 Hyperglycemia, unspecified: Secondary | ICD-10-CM

## 2018-02-22 DIAGNOSIS — Z0000 Encounter for general adult medical examination without abnormal findings: Secondary | ICD-10-CM | POA: Diagnosis not present

## 2018-02-22 DIAGNOSIS — Z23 Encounter for immunization: Secondary | ICD-10-CM | POA: Diagnosis not present

## 2018-02-22 DIAGNOSIS — J069 Acute upper respiratory infection, unspecified: Secondary | ICD-10-CM

## 2018-02-22 DIAGNOSIS — Z0001 Encounter for general adult medical examination with abnormal findings: Secondary | ICD-10-CM | POA: Diagnosis not present

## 2018-02-22 DIAGNOSIS — G8929 Other chronic pain: Secondary | ICD-10-CM

## 2018-02-22 DIAGNOSIS — M545 Low back pain: Secondary | ICD-10-CM

## 2018-02-22 DIAGNOSIS — R062 Wheezing: Secondary | ICD-10-CM

## 2018-02-22 LAB — CBC WITH DIFFERENTIAL/PLATELET
Basophils Absolute: 0.1 10*3/uL (ref 0.0–0.1)
Basophils Relative: 1 % (ref 0.0–3.0)
EOS PCT: 3.9 % (ref 0.0–5.0)
Eosinophils Absolute: 0.4 10*3/uL (ref 0.0–0.7)
HCT: 43.2 % (ref 36.0–46.0)
HEMOGLOBIN: 14.8 g/dL (ref 12.0–15.0)
Lymphocytes Relative: 32.6 % (ref 12.0–46.0)
Lymphs Abs: 3.1 10*3/uL (ref 0.7–4.0)
MCHC: 34.4 g/dL (ref 30.0–36.0)
MCV: 91.9 fl (ref 78.0–100.0)
Monocytes Absolute: 0.7 10*3/uL (ref 0.1–1.0)
Monocytes Relative: 7.9 % (ref 3.0–12.0)
Neutro Abs: 5.2 10*3/uL (ref 1.4–7.7)
Neutrophils Relative %: 54.6 % (ref 43.0–77.0)
Platelets: 225 10*3/uL (ref 150.0–400.0)
RBC: 4.7 Mil/uL (ref 3.87–5.11)
RDW: 12.8 % (ref 11.5–15.5)
WBC: 9.5 10*3/uL (ref 4.0–10.5)

## 2018-02-22 LAB — HEMOGLOBIN A1C: Hgb A1c MFr Bld: 5.2 % (ref 4.6–6.5)

## 2018-02-22 LAB — LIPID PANEL
CHOL/HDL RATIO: 5
Cholesterol: 163 mg/dL (ref 0–200)
HDL: 34.7 mg/dL — ABNORMAL LOW (ref >39.00)
NonHDL: 128.19
Triglycerides: 219 mg/dL — ABNORMAL HIGH (ref 0.0–149.0)
VLDL: 43.8 mg/dL — ABNORMAL HIGH (ref 0.0–40.0)

## 2018-02-22 LAB — BASIC METABOLIC PANEL
BUN: 14 mg/dL (ref 6–23)
CO2: 27 mEq/L (ref 19–32)
Calcium: 9.5 mg/dL (ref 8.4–10.5)
Chloride: 105 mEq/L (ref 96–112)
Creatinine, Ser: 0.72 mg/dL (ref 0.40–1.20)
GFR: 94.06 mL/min (ref >60.00)
Glucose, Bld: 71 mg/dL (ref 70–99)
Potassium: 4.1 mEq/L (ref 3.5–5.1)
SODIUM: 140 meq/L (ref 135–145)

## 2018-02-22 LAB — URINALYSIS, ROUTINE W REFLEX MICROSCOPIC
Bilirubin Urine: NEGATIVE
Hgb urine dipstick: NEGATIVE
Ketones, ur: NEGATIVE
Leukocytes,Ua: NEGATIVE
Nitrite: NEGATIVE
RBC / HPF: NONE SEEN (ref 0–?)
SPECIFIC GRAVITY, URINE: 1.02 (ref 1.000–1.030)
Total Protein, Urine: NEGATIVE
URINE GLUCOSE: NEGATIVE
Urobilinogen, UA: 0.2 (ref 0.0–1.0)
pH: 8 (ref 5.0–8.0)

## 2018-02-22 LAB — HEPATIC FUNCTION PANEL
ALBUMIN: 4.3 g/dL (ref 3.5–5.2)
ALT: 16 U/L (ref 0–35)
AST: 14 U/L (ref 0–37)
Alkaline Phosphatase: 43 U/L (ref 39–117)
Bilirubin, Direct: 0.1 mg/dL (ref 0.0–0.3)
Total Bilirubin: 0.6 mg/dL (ref 0.2–1.2)
Total Protein: 7.2 g/dL (ref 6.0–8.3)

## 2018-02-22 LAB — TSH: TSH: 1.02 u[IU]/mL (ref 0.35–4.50)

## 2018-02-22 LAB — LDL CHOLESTEROL, DIRECT: Direct LDL: 113 mg/dL

## 2018-02-22 MED ORDER — AMOXICILLIN 500 MG PO CAPS: 1000.0000 mg | ORAL_CAPSULE | Freq: Two times a day (BID) | ORAL | 0 refills | Status: DC

## 2018-02-22 MED ORDER — PROMETHAZINE HCL 25 MG PO TABS: 25.0000 mg | ORAL_TABLET | Freq: Three times a day (TID) | ORAL | 1 refills | Status: DC | PRN

## 2018-02-22 MED ORDER — METHYLPREDNISOLONE ACETATE 80 MG/ML IJ SUSP
80.0000 mg | Freq: Once | INTRAMUSCULAR | Status: AC
  Administered 2018-02-22: 80 mg via INTRAMUSCULAR

## 2018-02-22 MED ORDER — HYDROCODONE-HOMATROPINE 5-1.5 MG/5ML PO SYRP: 5.0000 mL | ORAL_SOLUTION | Freq: Four times a day (QID) | ORAL | 0 refills | Status: AC | PRN

## 2018-02-22 NOTE — Assessment & Plan Note (Signed)
Mild to mod, for antibx course,  to f/u any worsening symptoms or concerns 

## 2018-02-22 NOTE — Progress Notes (Signed)
Subjective:    Patient ID: Martha Thompson, female    DOB: 1985-09-28, 33 y.o.   MRN: [id number]  HPI  Here for wellness and f/u;  Overall doing ok;  Pt denies neurological change such as new headache, facial or extremity weakness.  Pt denies polydipsia, polyuria, or low sugar symptoms. Pt states overall good compliance with treatment and medications, good tolerability, and has been trying to follow appropriate diet.  Pt denies worsening depressive symptoms, suicidal ideation or panic. No fever, night sweats, wt loss, loss of appetite, or other constitutional symptoms.  Pt states good ability with ADL's, has low fall risk, home safety reviewed and adequate, no other significant changes in hearing or vision, and only occasionally active with exercise. Also, Pt denies chest pain, increased sob or doe, wheezing, orthopnea, PND, increased LE swelling, palpitations, dizziness or syncope, except for onset mild wheezing, sob doe in last wk with acute onset fever, facial pain, pressure, headache, general weakness and malaise.   Pt continues to have recurring LBP without change in severity, bowel or bladder change, fever, wt loss,  worsening LE pain/numbness/weakness, gait change or falls. BP Readings from Last 3 Encounters:  02/22/18 126/82  02/19/17 122/82  01/16/17 (!) 142/75   Wt Readings from Last 3 Encounters:  02/22/18 (!) 314 lb (142.4 kg)  02/19/17 279 lb (126.6 kg)  01/16/17 286 lb (129.7 kg)   Past Medical History:  Diagnosis Date  . ALLERGIC RHINITIS 10/30/2009  . ANXIETY 09/04/2006  . ASTHMA 12/25/2006   inhaler used 2 days ago  . ASTHMA, WITH ACUTE EXACERBATION 05/03/2009  . BLEPHARITIS, LEFT 05/21/2008  . Cervical disc disease    2 bulging discs to neck   . COMMON MIGRAINE 06/14/2007  . GANGLION CYST, WRIST, LEFT 10/11/2007  . GERD (gastroesophageal reflux disease) 04/24/2012  . Headache(784.0) 10/23/2008  . HYPERLIPIDEMIA 06/14/2007  . LOW BACK PAIN 09/04/2006  . Morbid obesity  (HCC) 09/04/2006  . OTITIS MEDIA, ACUTE, LEFT 03/27/2008  . SINUSITIS- ACUTE-NOS 10/11/2007  . TENOSYNOVITIS, WRIST 10/11/2007  . URI 10/29/2009  . URTICARIA 05/03/2009   Past Surgical History:  Procedure Laterality Date  . back surgury  01/2003   s/p lumbar disc  . CESAREAN SECTION N/A 02/19/2013   Procedure: CESAREAN SECTION;  Surgeon: Michael Litter, MD;  Location: [facility];  Service: Obstetrics;  Laterality: N/A;  . LAPAROSCOPY  04/07/2011   Procedure: LAPAROSCOPY OPERATIVE;  Surgeon: Meriel Pica, MD;  Location: [facility];  Service: Gynecology;  Laterality: N/A;  left salpingogectomy    reports that she has been smoking cigarettes. She has been smoking about 0.50 packs per day. She has never used smokeless tobacco. She reports current alcohol use. She reports that she does not use drugs. family history includes Anxiety disorder in her father; Diabetes in her father. Allergies  Allergen Reactions  . Prozac [Fluoxetine Hcl] Other (See Comments)   Current Outpatient Medications on File Prior to Visit  Medication Sig Dispense Refill  . albuterol (PROVENTIL HFA;VENTOLIN HFA) 108 (90 Base) MCG/ACT inhaler Inhale 2 puffs into the lungs every 6 (six) hours as needed. 3 Inhaler 3  . ALPRAZolam (XANAX) 1 MG tablet TAKE 1 TABLET BY MOUTH THREE TIMES A DAY AS NEEDED FOR ANXIETY 90 tablet 2  . beclomethasone (QVAR) 80 MCG/ACT inhaler Inhale 2 puffs into the lungs 2 (two) times daily. 1 Inhaler 12  . losartan (COZAAR) 25 MG tablet Take 1 tablet (25 mg total) by mouth daily. 90 tablet 3  .  traZODone (DESYREL) 50 MG tablet TAKE ONE-HALF TO ONE TABLET BY MOUTH AT BEDTIME AS NEEDED FOR SLEEP 90 tablet 1   No current facility-administered medications on file prior to visit.    Review of Systems Constitutional: Negative for other unusual diaphoresis, sweats, appetite or weight changes HENT: Negative for other worsening hearing loss, ear pain, facial swelling, mouth sores or neck stiffness.   Eyes:  Negative for other worsening pain, redness or other visual disturbance.  Respiratory: Negative for other stridor or swelling Cardiovascular: Negative for other palpitations or other chest pain  Gastrointestinal: Negative for worsening diarrhea or loose stools, blood in stool, distention or other pain Genitourinary: Negative for hematuria, flank pain or other change in urine volume.  Musculoskeletal: Negative for myalgias or other joint swelling.  Skin: Negative for other color change, or other wound or worsening drainage.  Neurological: Negative for other syncope or numbness. Hematological: Negative for other adenopathy or swelling Psychiatric/Behavioral: Negative for hallucinations, other worsening agitation, SI, self-injury, or new decreased concentration All other system neg per pt    Objective:   Physical Exam BP 126/82 (BP Location: Left Arm, Patient Position: Sitting, Cuff Size: Large)   Pulse (!) 59   Temp 98 F (36.7 C) (Oral)   Ht 5\' 11"  (1.803 m)   Wt (!) 314 lb (142.4 kg)   SpO2 98%   BMI 43.79 kg/m  VS noted, mild ill Constitutional: Pt is oriented to person, place, and time. Appears well-developed and well-nourished, in no significant distress and comfortable Head: Normocephalic and atraumatic  Eyes: Conjunctivae and EOM are normal. Pupils are equal, round, and reactive to light Bilat tm's with mild erythema.  Max sinus areas non tender.  Pharynx with mild erythema, no exudate Right Ear: External ear normal without discharge Left Ear: External ear normal without discharge Nose: Nose without discharge or deformity Mouth/Throat: Oropharynx is without other ulcerations and moist  Neck: Normal range of motion. Neck supple. No JVD present. No tracheal deviation present or significant neck LA or mass Cardiovascular: Normal rate, regular rhythm, normal heart sounds and intact distal pulses.   Pulmonary/Chest: WOB normal and breath sounds decreased without rales but with mild  diffuse wheezing  Abdominal: Soft. Bowel sounds are normal. NT. No HSM  Musculoskeletal: Normal range of motion. Exhibits no edema Lymphadenopathy: Has no other cervical adenopathy.  Neurological: Pt is alert and oriented to person, place, and time. Pt has normal reflexes. No cranial nerve deficit. Motor grossly intact, Gait intact Skin: Skin is warm and dry. No rash noted or new ulcerations Psychiatric:  Has normal mood and affect. Behavior is normal without agitation No other exam findings Lab Results  Component Value Date   WBC 11.8 (H) 02/19/2017   HGB 15.5 (H) 02/19/2017   HCT 45.6 02/19/2017   PLT 236.0 02/19/2017   GLUCOSE 90 02/19/2017   CHOL 131 02/19/2017   TRIG 96.0 02/19/2017   HDL 37.10 (L) 02/19/2017   LDLCALC 75 02/19/2017   ALT 17 02/19/2017   AST 12 02/19/2017   NA 139 02/19/2017   K 3.9 02/19/2017   CL 106 02/19/2017   CREATININE 0.61 02/19/2017   BUN 10 02/19/2017   CO2 24 02/19/2017   TSH 1.21 02/19/2017   HGBA1C 5.2 02/19/2017         Assessment & Plan:

## 2018-02-22 NOTE — Addendum Note (Signed)
Addended by: Scarlett Presto on: 02/22/2018 03:18 PM   Modules accepted: Orders

## 2018-02-22 NOTE — Assessment & Plan Note (Signed)
For depomedrol IM 80 qd, prednisone asd

## 2018-02-22 NOTE — Assessment & Plan Note (Signed)
stable overall by history and exam, recent data reviewed with pt, and pt to continue medical treatment as before,  to f/u any worsening symptoms or concerns  

## 2018-02-22 NOTE — Assessment & Plan Note (Signed)

## 2018-02-22 NOTE — Assessment & Plan Note (Signed)
Chronic peristent s/p lumbar surgury x 1 for disc, consider f/u with ortho

## 2018-02-22 NOTE — Patient Instructions (Addendum)
You had the flu shot today, and the steroid shot today  Please take all new medication as prescribed - the antibiotic, and cough medicine, and prednisone  Please continue all other medications as before, and refills have been done if requested.  Please have the pharmacy call with any other refills you may need.  Please continue your efforts at being more active, low cholesterol diet, and weight control.  You are otherwise up to date with prevention measures today.  Please keep your appointments with your specialists as you may have planned  Please go to the LAB in the Basement (turn left off the elevator) for the tests to be done today  You will be contacted by phone if any changes need to be made immediately.  Otherwise, you will receive a letter about your results with an explanation, but please check with MyChart first.  Please remember to sign up for MyChart if you have not done so, as this will be important to you in the future with finding out test results, communicating by private email, and scheduling acute appointments online when needed.  Please return in 1 year for your yearly visit, or sooner if needed, with Lab testing done 3-5 days before

## 2018-03-08 ENCOUNTER — Other Ambulatory Visit: Payer: Self-pay | Admitting: Internal Medicine

## 2018-03-31 ENCOUNTER — Other Ambulatory Visit: Payer: Self-pay | Admitting: Internal Medicine

## 2018-04-24 ENCOUNTER — Ambulatory Visit (INDEPENDENT_AMBULATORY_CARE_PROVIDER_SITE_OTHER): Payer: 59 | Admitting: Internal Medicine

## 2018-04-24 ENCOUNTER — Encounter: Payer: Self-pay | Admitting: Internal Medicine

## 2018-04-24 ENCOUNTER — Ambulatory Visit: Payer: Self-pay | Admitting: *Deleted

## 2018-04-24 ENCOUNTER — Telehealth: Payer: Self-pay | Admitting: Internal Medicine

## 2018-04-24 DIAGNOSIS — R739 Hyperglycemia, unspecified: Secondary | ICD-10-CM

## 2018-04-24 DIAGNOSIS — F411 Generalized anxiety disorder: Secondary | ICD-10-CM

## 2018-04-24 DIAGNOSIS — J069 Acute upper respiratory infection, unspecified: Secondary | ICD-10-CM | POA: Diagnosis not present

## 2018-04-24 MED ORDER — ONDANSETRON HCL 4 MG PO TABS: 4.0000 mg | ORAL_TABLET | Freq: Three times a day (TID) | ORAL | 0 refills | Status: DC | PRN

## 2018-04-24 MED ORDER — AZITHROMYCIN 250 MG PO TABS: ORAL_TABLET | ORAL | 1 refills | Status: DC

## 2018-04-24 NOTE — Telephone Encounter (Signed)
Nausea and low grade fever of  99.8. Pt denies being in contact with someone that has tested positive for the Covid-19 virus.  Also denies shortness of breath, headache, sore throat or other covid-=19 symptoms. Drinking plenty of liquids. She has a smoker's cough, which she has all the time. Had one episode of diarrhea yesterday but no abd pain. She can not go back to work unless cleared by her provider. [facility] at [facility] notified regarding appointment. Call conference to the office for appointment. Routing to [employer] at [facility].   Reason for Disposition . [1] Fever AND [2] no signs of serious infection or localizing symptoms (all other triage questions negative)  Answer Assessment - Initial Assessment Questions 1. TEMPERATURE: "What is the most recent temperature?"  "How was it measured?"      99.8 oral thermometer 2. ONSET: "When did the fever start?"      today 3. SYMPTOMS: "Do you have any other symptoms besides the fever?"  (e.g., colds, headache, sore throat, earache, cough, rash, diarrhea, vomiting, abdominal pain)     Nausea, left ear hurts, smokers cough, sinus, diarrhea yesterday morning just one time 4. CAUSE: If there are no symptoms, ask: "What do you think is causing the fever?"      Not sure 5. CONTACTS: "Does anyone else in the family have an infection?"     no 6. TREATMENT: "What have you done so far to treat this fever?" (e.g., medications)     Not yet 7. IMMUNOCOMPROMISE: "Do you have of the following: diabetes, HIV positive, splenectomy, cancer chemotherapy, chronic steroid treatment, transplant patient, etc."     no 8. PREGNANCY: "Is there any chance you are pregnant?" "When was your last menstrual period?"     dont think so  Has the miringu 9. TRAVEL: "Have you traveled out of the country in the last month?" (e.g., travel history, exposures)     no  Protocols used: FEVER-A-AH

## 2018-04-24 NOTE — Telephone Encounter (Signed)
Appointment scheduled today.

## 2018-04-24 NOTE — Telephone Encounter (Signed)
Ok to ship here, and we will coordinate with patient and family

## 2018-04-24 NOTE — Patient Instructions (Signed)
Please take all new medication as prescribed - the antibiotic, as well as the nausea medication if needed  Please continue all other medications as before, and refills have been done if requested.  Please have the pharmacy call with any other refills you may need.  Please continue your efforts at being more active, low cholesterol diet, and weight control.  You are otherwise up to date with prevention measures today.  Please keep your appointments with your specialists as you may have planned  You are given the work note today, and we will see you back next Mon AM with a virtual visit to see if ok to return to work

## 2018-04-24 NOTE — Telephone Encounter (Signed)
Copied from CRM [id number]. Topic: Quick Communication - See Telephone Encounter >> Apr 24, 2018  2:08 PM Lorrine Kin, [place] wrote: CRM for notification. See Telephone encounter for: 04/24/18. Sissy with [facility] calling and states that all of their offices are closed and the patient is on Xolair injections every 4 weeks. States that the patient has [employer] and [facility] is her pharmacy that fills this prescription. States that [facility] will not ship drug to patient's home to be self administered at home. States that [facility] advised that if Dr Jonny Ruiz is willing to, the pharmacy could send to office and the patient could pick up the medication for self administer at home or someone in the office could give this. States that the patient has a cousin that is a paramedic and if she could pick up at office, her cousin could give it to her at home and stay the 30 mins after injection to make sure there is no reaction. Sissy just needs the okay from Dr Jonny Ruiz to have this medication shipped to the office. CB#: [phone]

## 2018-04-24 NOTE — Assessment & Plan Note (Signed)
stable overall by history and exam, recent data reviewed with pt, and pt to continue medical treatment as before,  to f/u any worsening symptoms or concerns  

## 2018-04-24 NOTE — Telephone Encounter (Signed)
Sissy, [facility], has been informed and will have medication sent here. I also informed Sissy that we would prefer our RN to administer the medication to the patient. Sissy did inform me that the medication is in a prefilled syringe and that the patient has been taking this medication for years without any reactions and doesn't anticipate that a reaction will occur.

## 2018-04-24 NOTE — Telephone Encounter (Signed)
Noted  

## 2018-04-24 NOTE — Assessment & Plan Note (Signed)
Mild to mod, for antibx course,  to f/u any worsening symptoms or concerns; to go to [facility] ED for any onset cough and/or sob

## 2018-04-24 NOTE — Progress Notes (Signed)
Patient ID: Martha Thompson, female   DOB: 12/19/1985, 33 y.o.   MRN: [id number]  Virtual Visit via Video Note  I connected with Martha Thompson on 04/24/18 at  9:40 AM EDT by a video enabled telemedicine application and verified that I am speaking with the correct person using two identifiers. Pt is at home, I am in office, no other persons present   I discussed the limitations of evaluation and management by telemedicine and the availability of in person appointments. The patient expressed understanding and agreed to proceed.  History of Present Illness:  Here with 2-3 days acute onset fever, facial pain, pressure, headache, general weakness and malaise, and greenish d/c, with mild ST and cough, but pt denies chest pain, wheezing, increased sob or doe, orthopnea, PND, increased LE swelling, palpitations, dizziness or syncope.  Has also small loose non bloody stool yesterday as well as mild nausea.  No significant cough or sob. Pt denies chest pain, increased sob or doe, wheezing, orthopnea, PND, increased LE swelling, palpitations, dizziness or syncope.   Pt denies polydipsia, polyuria  Denies worsening depressive symptoms, suicidal ideation, or panic Past Medical History:  Diagnosis Date  . ALLERGIC RHINITIS 10/30/2009  . ANXIETY 09/04/2006  . ASTHMA 12/25/2006   inhaler used 2 days ago  . ASTHMA, WITH ACUTE EXACERBATION 05/03/2009  . BLEPHARITIS, LEFT 05/21/2008  . Cervical disc disease    2 bulging discs to neck   . COMMON MIGRAINE 06/14/2007  . GANGLION CYST, WRIST, LEFT 10/11/2007  . GERD (gastroesophageal reflux disease) 04/24/2012  . Headache(784.0) 10/23/2008  . HYPERLIPIDEMIA 06/14/2007  . LOW BACK PAIN 09/04/2006  . Morbid obesity (HCC) 09/04/2006  . OTITIS MEDIA, ACUTE, LEFT 03/27/2008  . SINUSITIS- ACUTE-NOS 10/11/2007  . TENOSYNOVITIS, WRIST 10/11/2007  . URI 10/29/2009  . URTICARIA 05/03/2009   Past Surgical History:  Procedure Laterality Date  . back surgury  01/2003   s/p lumbar disc  . CESAREAN SECTION N/A 02/19/2013   Procedure: CESAREAN SECTION;  Surgeon: Michael LitterNaima A Dillard, MD;  Location: [facility];  Service: Obstetrics;  Laterality: N/A;  . LAPAROSCOPY  04/07/2011   Procedure: LAPAROSCOPY OPERATIVE;  Surgeon: Meriel Picaichard M Holland, MD;  Location: [facility];  Service: Gynecology;  Laterality: N/A;  left salpingogectomy    reports that she has been smoking cigarettes. She has been smoking about 0.50 packs per day. She has never used smokeless tobacco. She reports current alcohol use. She reports that she does not use drugs. family history includes Anxiety disorder in her father; Diabetes in her father. Allergies  Allergen Reactions  . Prozac [Fluoxetine Hcl] Other (See Comments)   Current Outpatient Medications on File Prior to Visit  Medication Sig Dispense Refill  . ALPRAZolam (XANAX) 1 MG tablet TAKE 1 TABLET BY MOUTH THREE TIMES A DAY AS NEEDED FOR ANXIETY 90 tablet 2  . amoxicillin (AMOXIL) 500 MG capsule Take 2 capsules (1,000 mg total) by mouth 2 (two) times daily. 40 capsule 0  . beclomethasone (QVAR) 80 MCG/ACT inhaler Inhale 2 puffs into the lungs 2 (two) times daily. 1 Inhaler 12  . losartan (COZAAR) 25 MG tablet TAKE 1 TABLET BY MOUTH EVERY DAY 90 tablet 3  . PROAIR HFA 108 (90 Base) MCG/ACT inhaler TAKE 2 PUFFS BY MOUTH EVERY 6 HOURS AS NEEDED 25.5 Inhaler 3  . promethazine (PHENERGAN) 25 MG tablet Take 1 tablet (25 mg total) by mouth every 8 (eight) hours as needed for nausea or vomiting. 20 tablet 1  . traZODone (DESYREL)  50 MG tablet TAKE ONE-HALF TO ONE TABLET BY MOUTH AT BEDTIME AS NEEDED FOR SLEEP 90 tablet 1   No current facility-administered medications on file prior to visit.     Observations/Objective: Alert, obese, mild ill appaering, swollen tender flushed bilat maxillary sinus, mentating well, alert, cn 2-12 intact, moves all 4s, mild nervous mood and affect, no other visible rash Lab Results  Component Value Date   WBC 9.5 02/22/2018    HGB 14.8 02/22/2018   HCT 43.2 02/22/2018   PLT 225.0 02/22/2018   GLUCOSE 71 02/22/2018   CHOL 163 02/22/2018   TRIG 219.0 (H) 02/22/2018   HDL 34.70 (L) 02/22/2018   LDLDIRECT 113.0 02/22/2018   LDLCALC 75 02/19/2017   ALT 16 02/22/2018   AST 14 02/22/2018   NA 140 02/22/2018   K 4.1 02/22/2018   CL 105 02/22/2018   CREATININE 0.72 02/22/2018   BUN 14 02/22/2018   CO2 27 02/22/2018   TSH 1.02 02/22/2018   HGBA1C 5.2 02/22/2018   Assessment and Plan: See notes  Follow Up Instructions: See notes   I discussed the assessment and treatment plan with the patient. The patient was provided an opportunity to ask questions and all were answered. The patient agreed with the plan and demonstrated an understanding of the instructions.   The patient was advised to call back or seek an in-person evaluation if the symptoms worsen or if the condition fails to improve as anticipated.   Oliver Barre, MD

## 2018-04-25 NOTE — Telephone Encounter (Signed)
Left voicemail asking patient to call back, can talk with either Tammy/front office or Tamara,RN---we need to coordinate administration of shot,office visit and work note requested by patient

## 2018-04-26 NOTE — Telephone Encounter (Signed)
I have talked with lana/[facility], in same clinic as this patient's allergy doctor---per lana, patient is to contact allergy doctor via [facility] to arrange alternative tx during covid19---[facility] office is currently closed and all staff are working from home, but all providers have alternative methods in place for administrations needed that are patient and provider specific---lana/[facility] will be contacting patient to arrange another way for her to get tx without [facility] being involved---

## 2018-04-29 ENCOUNTER — Other Ambulatory Visit: Payer: Self-pay | Admitting: Internal Medicine

## 2018-04-29 ENCOUNTER — Ambulatory Visit: Payer: 59 | Admitting: Internal Medicine

## 2018-04-29 NOTE — Telephone Encounter (Signed)
Done erx 

## 2018-05-03 ENCOUNTER — Telehealth: Payer: Self-pay

## 2018-05-03 NOTE — Telephone Encounter (Signed)
Pt called back and stated her husband is going to come to office to pick up xolair.

## 2018-05-03 NOTE — Telephone Encounter (Signed)
This encounter was created in error - please disregard.

## 2018-05-03 NOTE — Telephone Encounter (Signed)
Medication has been picked up.  

## 2018-05-03 NOTE — Telephone Encounter (Signed)
lvm asking patient to come by and pick up xolair injection to take with her---we cannot give this injection at this office, however her pharmacy sent in to this office---patient needs to take injection and coordinate the administration with her allergy doctor at [facility]---can talk with Martha Thompson if any further questions---injection is located in side A refrigerator

## 2018-05-17 ENCOUNTER — Other Ambulatory Visit: Payer: Self-pay | Admitting: Internal Medicine

## 2018-05-20 NOTE — Telephone Encounter (Signed)
[place] Controlled Database Checked Last filled: 04/14/18 # 90 LOV w/you: 04/24/18 Next appt w/you: None

## 2018-05-20 NOTE — Telephone Encounter (Signed)
Done erx 

## 2018-08-15 ENCOUNTER — Other Ambulatory Visit: Payer: Self-pay | Admitting: Internal Medicine

## 2018-08-16 NOTE — Telephone Encounter (Signed)
Done erx 

## 2018-09-15 ENCOUNTER — Other Ambulatory Visit: Payer: Self-pay | Admitting: Internal Medicine

## 2018-10-22 ENCOUNTER — Telehealth: Payer: Self-pay

## 2018-10-22 NOTE — Telephone Encounter (Signed)
Noted  Copied from [place] [id number]. Topic: General - Other >> Oct 21, 2018  4:29 PM Celene Kras A wrote: Reason for CRM: Pt called stating she would be faxing over her wellness paperwork for her insurance for PCP to fill out. Please advise.

## 2018-11-01 NOTE — Telephone Encounter (Signed)
Dr. Jenny Reichmann, have you seen wellness forms for patient?

## 2018-11-01 NOTE — Telephone Encounter (Signed)
I dont think I have seen this, sorry

## 2018-11-01 NOTE — Telephone Encounter (Signed)
Patient called in checking on status of paperwork for insurance. Please advise and call back.

## 2018-11-01 NOTE — Telephone Encounter (Signed)
I called pt and advised we have not received form. She is going to re fax the form to side B fax # [phone] later today.

## 2018-11-05 ENCOUNTER — Encounter: Payer: Self-pay | Admitting: Internal Medicine

## 2018-11-13 ENCOUNTER — Other Ambulatory Visit: Payer: Self-pay | Admitting: Internal Medicine

## 2018-11-14 NOTE — Telephone Encounter (Signed)
Done erx 

## 2018-12-08 ENCOUNTER — Encounter: Payer: Self-pay | Admitting: Emergency Medicine

## 2018-12-08 ENCOUNTER — Observation Stay
Admission: EM | Admit: 2018-12-08 | Discharge: 2018-12-10 | Disposition: A | Discharge status: Home / Self Care | Payer: 59 | Attending: Surgery | Admitting: Surgery

## 2018-12-08 ENCOUNTER — Other Ambulatory Visit: Payer: Self-pay

## 2018-12-08 DIAGNOSIS — I10 Essential (primary) hypertension: Secondary | ICD-10-CM | POA: Insufficient documentation

## 2018-12-08 DIAGNOSIS — Z7951 Long term (current) use of inhaled steroids: Secondary | ICD-10-CM | POA: Insufficient documentation

## 2018-12-08 DIAGNOSIS — Z793 Long term (current) use of hormonal contraceptives: Secondary | ICD-10-CM | POA: Insufficient documentation

## 2018-12-08 DIAGNOSIS — Z6841 Body Mass Index (BMI) 40.0 and over, adult: Secondary | ICD-10-CM | POA: Diagnosis not present

## 2018-12-08 DIAGNOSIS — J45909 Unspecified asthma, uncomplicated: Secondary | ICD-10-CM | POA: Insufficient documentation

## 2018-12-08 DIAGNOSIS — Z79899 Other long term (current) drug therapy: Secondary | ICD-10-CM | POA: Insufficient documentation

## 2018-12-08 DIAGNOSIS — K3580 Unspecified acute appendicitis: Principal | ICD-10-CM | POA: Insufficient documentation

## 2018-12-08 DIAGNOSIS — Z975 Presence of (intrauterine) contraceptive device: Secondary | ICD-10-CM | POA: Diagnosis not present

## 2018-12-08 DIAGNOSIS — F419 Anxiety disorder, unspecified: Secondary | ICD-10-CM | POA: Insufficient documentation

## 2018-12-08 DIAGNOSIS — K353 Acute appendicitis with localized peritonitis, without perforation or gangrene: Secondary | ICD-10-CM | POA: Diagnosis present

## 2018-12-08 DIAGNOSIS — G43909 Migraine, unspecified, not intractable, without status migrainosus: Secondary | ICD-10-CM | POA: Diagnosis not present

## 2018-12-08 DIAGNOSIS — K219 Gastro-esophageal reflux disease without esophagitis: Secondary | ICD-10-CM | POA: Insufficient documentation

## 2018-12-08 DIAGNOSIS — E785 Hyperlipidemia, unspecified: Secondary | ICD-10-CM | POA: Diagnosis not present

## 2018-12-08 DIAGNOSIS — M5030 Other cervical disc degeneration, unspecified cervical region: Secondary | ICD-10-CM | POA: Insufficient documentation

## 2018-12-08 DIAGNOSIS — Z20828 Contact with and (suspected) exposure to other viral communicable diseases: Secondary | ICD-10-CM | POA: Insufficient documentation

## 2018-12-08 DIAGNOSIS — F17210 Nicotine dependence, cigarettes, uncomplicated: Secondary | ICD-10-CM | POA: Insufficient documentation

## 2018-12-08 LAB — CBC
HCT: 43.9 % (ref 36.0–46.0)
Hemoglobin: 15.1 g/dL — ABNORMAL HIGH (ref 12.0–15.0)
MCH: 31 pg (ref 26.0–34.0)
MCHC: 34.4 g/dL (ref 30.0–36.0)
MCV: 90.1 fL (ref 80.0–100.0)
Platelets: 215 10*3/uL (ref 150–400)
RBC: 4.87 MIL/uL (ref 3.87–5.11)
RDW: 11.4 % — ABNORMAL LOW (ref 11.5–15.5)
WBC: 16.4 10*3/uL — ABNORMAL HIGH (ref 4.0–10.5)
nRBC: 0 % (ref 0.0–0.2)

## 2018-12-08 LAB — URINALYSIS, COMPLETE (UACMP) WITH MICROSCOPIC
Bacteria, UA: NONE SEEN
Bilirubin Urine: NEGATIVE
Glucose, UA: NEGATIVE mg/dL
Hgb urine dipstick: NEGATIVE
Ketones, ur: NEGATIVE mg/dL
Leukocytes,Ua: NEGATIVE
Nitrite: NEGATIVE
Protein, ur: 30 mg/dL — AB
Specific Gravity, Urine: 1.027 (ref 1.005–1.030)
pH: 5 (ref 5.0–8.0)

## 2018-12-08 LAB — COMPREHENSIVE METABOLIC PANEL
ALT: 21 U/L (ref 0–44)
AST: 20 U/L (ref 15–41)
Albumin: 4.3 g/dL (ref 3.5–5.0)
Alkaline Phosphatase: 41 U/L (ref 38–126)
Anion gap: 13 (ref 5–15)
BUN: 13 mg/dL (ref 6–20)
CO2: 23 mmol/L (ref 22–32)
Calcium: 9.4 mg/dL (ref 8.9–10.3)
Chloride: 101 mmol/L (ref 98–111)
Creatinine, Ser: 0.63 mg/dL (ref 0.44–1.00)
GFR calc Af Amer: >60 mL/min (ref >60)
GFR calc non Af Amer: >60 mL/min (ref >60)
Glucose, Bld: 105 mg/dL — ABNORMAL HIGH (ref 70–99)
Potassium: 3.9 mmol/L (ref 3.5–5.1)
Sodium: 137 mmol/L (ref 135–145)
Total Bilirubin: 0.5 mg/dL (ref 0.3–1.2)
Total Protein: 7.4 g/dL (ref 6.5–8.1)

## 2018-12-08 LAB — LIPASE, BLOOD: Lipase: 32 U/L (ref 11–51)

## 2018-12-08 LAB — POCT PREGNANCY, URINE: Preg Test, Ur: NEGATIVE

## 2018-12-08 MED ORDER — MORPHINE SULFATE (PF) 4 MG/ML IV SOLN
4.0000 mg | Freq: Once | INTRAVENOUS | Status: AC
  Administered 2018-12-09: 4 mg via INTRAVENOUS
  Filled 2018-12-08: qty 1

## 2018-12-08 MED ORDER — ONDANSETRON HCL 4 MG/2ML IJ SOLN
4.0000 mg | INTRAMUSCULAR | Status: AC
  Administered 2018-12-09: 4 mg via INTRAVENOUS
  Filled 2018-12-08: qty 2

## 2018-12-08 MED ORDER — SODIUM CHLORIDE 0.9 % IV BOLUS
1000.0000 mL | Freq: Once | INTRAVENOUS | Status: AC
  Administered 2018-12-09: 1000 mL via INTRAVENOUS

## 2018-12-08 NOTE — ED Provider Notes (Signed)
[facility] Emergency Department Provider Note  ____________________________________________   First MD Initiated Contact with Patient 12/08/18 2340     (approximate)  I have reviewed the triage vital signs and the nursing notes.   HISTORY  Chief Complaint Flank Pain and Abdominal Pain    HPI Martha Thompson is a 33 y.o. female who is generally healthy in spite of her medical history listed below and who has had a left ectopic pregnancy status post surgery and a C-section but no other abdominal surgeries.  She presents for acute onset and severe sharp stabbing abdominal pain.  She says that she had some pain yesterday but it resolved after she took a Phenergan.  Today she has been nauseated and has not had anything to eat since the morning.  She developed sharp abdominal pain around her bellybutton which has migrated to her right lower quadrant.  Moving around makes the pain worse and lying still and flat makes it better.  She has had severe nausea but no vomiting.  No diarrhea or bowel urgency.  No contact with COVID-19 patients.  She denies fever/chills, sore throat, loss of smell and taste, shortness of breath, cough, chest pain, and upper abdominal pain.  She has no history of kidney stones and has had no dysuria.  She is in a monogamous relationship and has an IUD so she does not have a menstrual cycle.         Past Medical History:  Diagnosis Date  . ALLERGIC RHINITIS 10/30/2009  . ANXIETY 09/04/2006  . ASTHMA 12/25/2006   inhaler used 2 days ago  . ASTHMA, WITH ACUTE EXACERBATION 05/03/2009  . BLEPHARITIS, LEFT 05/21/2008  . Cervical disc disease    2 bulging discs to neck   . COMMON MIGRAINE 06/14/2007  . GANGLION CYST, WRIST, LEFT 10/11/2007  . GERD (gastroesophageal reflux disease) 04/24/2012  . Headache(784.0) 10/23/2008  . HYPERLIPIDEMIA 06/14/2007  . LOW BACK PAIN 09/04/2006  . Morbid obesity (HCC) 09/04/2006  . OTITIS MEDIA, ACUTE, LEFT  03/27/2008  . SINUSITIS- ACUTE-NOS 10/11/2007  . TENOSYNOVITIS, WRIST 10/11/2007  . URI 10/29/2009  . URTICARIA 05/03/2009    Patient Active Problem List   Diagnosis Date Noted  . STD exposure 02/19/2017  . Cough 08/08/2016  . Chronic pain of left knee 08/08/2016  . Insomnia 02/08/2016  . Acute left lumbar radiculopathy 09/16/2015  . Hyperglycemia 08/13/2015  . GERD (gastroesophageal reflux disease) 04/24/2012  . Wheezing 04/17/2011  . Preventative health care 04/13/2010  . Allergic rhinitis 10/30/2009  . Acute upper respiratory infection 10/29/2009  . DISC DISEASE, CERVICAL 10/29/2009  . Hives 05/03/2009  . Essential hypertension 10/11/2007  . HYPERLIPIDEMIA 06/14/2007  . Asthma 12/25/2006  . Morbid obesity (HCC) 09/04/2006  . Generalized anxiety disorder 09/04/2006  . Anxiety with depression 09/04/2006  . LOW BACK PAIN 09/04/2006    Past Surgical History:  Procedure Laterality Date  . back surgury  01/2003   s/p lumbar disc  . CESAREAN SECTION N/A 02/19/2013   Procedure: CESAREAN SECTION;  Surgeon: Michael Litter, MD;  Location: [facility];  Service: Obstetrics;  Laterality: N/A;  . LAPAROSCOPY  04/07/2011   Procedure: LAPAROSCOPY OPERATIVE;  Surgeon: Meriel Pica, MD;  Location: [facility];  Service: Gynecology;  Laterality: N/A;  left salpingogectomy    Prior to Admission medications   Medication Sig Start Date End Date Taking? Authorizing Provider  ALPRAZolam (XANAX) 1 MG tablet TAKE 1 TABLET BY MOUTH THREE TIMES A DAY AS NEEDED  FOR ANXIETY 11/14/18   Corwin LevinsJohn, James W, MD  amoxicillin (AMOXIL) 500 MG capsule Take 2 capsules (1,000 mg total) by mouth 2 (two) times daily. 02/22/18   Corwin LevinsJohn, James W, MD  azithromycin (ZITHROMAX Z-PAK) 250 MG tablet 2 tab by mouth day 1, then 1 per day 04/24/18   Corwin LevinsJohn, James W, MD  beclomethasone (QVAR) 80 MCG/ACT inhaler Inhale 2 puffs into the lungs 2 (two) times daily. 08/13/15   Corwin LevinsJohn, James W, MD  losartan (COZAAR) 25 MG tablet TAKE 1 TABLET BY MOUTH  EVERY DAY 03/08/18   Corwin LevinsJohn, James W, MD  ondansetron (ZOFRAN) 4 MG tablet Take 1 tablet (4 mg total) by mouth every 8 (eight) hours as needed for nausea or vomiting. 04/24/18   Corwin LevinsJohn, James W, MD  PROAIR HFA 108 [phone](90 Base) MCG/ACT inhaler TAKE 2 PUFFS BY MOUTH EVERY 6 HOURS AS NEEDED 04/01/18   Corwin LevinsJohn, James W, MD  promethazine (PHENERGAN) 25 MG tablet Take 1 tablet (25 mg total) by mouth every 8 (eight) hours as needed for nausea or vomiting. 02/22/18   Corwin LevinsJohn, James W, MD  traZODone (DESYREL) 50 MG tablet TAKE ONE-HALF TO ONE TABLET BY MOUTH AT BEDTIME AS NEEDED FOR SLEEP 09/17/18   Corwin LevinsJohn, James W, MD    Allergies Prozac [fluoxetine hcl]  Family History  Problem Relation Age of Onset  . Anxiety disorder Father   . Diabetes Father     Social History Social History   Tobacco Use  . Smoking status: Current Every Day Smoker    Packs/day: 0.50    Types: Cigarettes  . Smokeless tobacco: Never Used  Substance Use Topics  . Alcohol use: Yes    Alcohol/week: 0.0 standard drinks    Comment: rarely  . Drug use: No    Review of Systems Constitutional: No fever/chills Eyes: No visual changes. ENT: No sore throat. Cardiovascular: Denies chest pain. Respiratory: Denies shortness of breath. Gastrointestinal: Right lower quadrant abdominal pain with nausea as described above. Genitourinary: Negative for dysuria. Musculoskeletal: Negative for neck pain.  Negative for back pain. Integumentary: Negative for rash. Neurological: Negative for headaches, focal weakness or numbness.   ____________________________________________   PHYSICAL EXAM:  VITAL SIGNS: ED Triage Vitals  Enc Vitals Group     BP 12/08/18 2128 (!) 155/71     Pulse Rate 12/08/18 2128 74     Resp 12/08/18 2128 20     Temp 12/08/18 2128 98.6 F (37 C)     Temp Source 12/08/18 2128 Oral     SpO2 12/08/18 2128 97 %     Weight --      Height --      Head Circumference --      Peak Flow --      Pain Score 12/08/18 2127 9      Pain Loc --      Pain Edu? --      Excl. in GC? --     Constitutional: Alert and oriented.  Appears to be in pain but nontoxic. Eyes: Conjunctivae are normal.  Head: Atraumatic. Nose: No congestion/rhinnorhea. Mouth/Throat: Patient is wearing a mask. Neck: No stridor.  No meningeal signs.   Cardiovascular: Normal rate, regular rhythm. Good peripheral circulation. Grossly normal heart sounds. Respiratory: Normal respiratory effort.  No retractions. Gastrointestinal: Soft and nondistended.  Severe tenderness to palpation in the right lower quadrant with localized peritonitis.  Also tender around the umbilicus.  No upper abdominal tenderness.  She has both rebound and guarding. Musculoskeletal: No lower  extremity tenderness nor edema. No gross deformities of extremities. Neurologic:  Normal speech and language. No gross focal neurologic deficits are appreciated.  Skin:  Skin is warm, dry and intact. Psychiatric: Mood and affect are normal. Speech and behavior are normal.  ____________________________________________   LABS (all labs ordered are listed, but only abnormal results are displayed)  Labs Reviewed  COMPREHENSIVE METABOLIC PANEL - Abnormal; Notable for the following components:      Result Value   Glucose, Bld 105 (*)    All other components within normal limits  CBC - Abnormal; Notable for the following components:   WBC 16.4 (*)    Hemoglobin 15.1 (*)    RDW 11.4 (*)    All other components within normal limits  URINALYSIS, COMPLETE (UACMP) WITH MICROSCOPIC - Abnormal; Notable for the following components:   Color, Urine YELLOW (*)    APPearance HAZY (*)    Protein, ur 30 (*)    All other components within normal limits  SARS CORONAVIRUS 2 (TAT 6-24 HRS)  LIPASE, BLOOD  POC URINE PREG, ED  POCT PREGNANCY, URINE   ____________________________________________  EKG  No indication for EKG ____________________________________________  RADIOLOGY I, Loleta Rose,  personally viewed and evaluated these images (plain radiographs) as part of my medical decision making, as well as reviewing the written report by the radiologist.  ED MD interpretation:  early appendicitis  Official radiology report(s): Ct Abd/pelvis W/ Iv Contrast  Result Date: 12/09/2018 CLINICAL DATA:  Right lower quadrant, right flank pain EXAM: CT ABDOMEN AND PELVIS WITH CONTRAST TECHNIQUE: Multidetector CT imaging of the abdomen and pelvis was performed using the standard protocol following bolus administration of intravenous contrast. CONTRAST:  OMNIPAQUE IOHEXOL 350 MG/ML SOLN COMPARISON:  None. FINDINGS: Lower chest: Lung bases are clear. No effusions. Heart is normal size. Hepatobiliary: No focal hepatic abnormality. Gallbladder unremarkable. Pancreas: No focal abnormality or ductal dilatation. Spleen: No focal abnormality.  Normal size. Adrenals/Urinary Tract: No adrenal abnormality. No focal renal abnormality. No stones or hydronephrosis. Urinary bladder is unremarkable. Stomach/Bowel: Appendix is borderline dilated, measuring 8 mm. Early surrounding inflammation. Findings concerning for early acute appendicitis. Stomach, large and small bowel grossly unremarkable. Vascular/Lymphatic: No evidence of aneurysm or adenopathy. Reproductive: Uterus and adnexa unremarkable. No mass. IUD noted in the uterus. Other: No free fluid or free air. Musculoskeletal: No acute bony abnormality. IMPRESSION: Borderline size of the appendix with surrounding early inflammatory change. Findings concerning for early acute appendicitis. Electronically Signed   By: Charlett Nose M.D.   On: 12/09/2018 00:27    ____________________________________________   PROCEDURES   Procedure(s) performed (including Critical Care):  Procedures   ____________________________________________   INITIAL IMPRESSION / MDM / ASSESSMENT AND PLAN / ED COURSE  As part of my medical decision making, I reviewed the  following data within the electronic MEDICAL RECORD NUMBER Nursing notes reviewed and incorporated, Labs reviewed , Old chart reviewed, A consult was requested and obtained from this/these consultant(s) Surgery and Notes from prior ED visits   Differential diagnosis includes, but is not limited to, appendicitis, diverticulitis, ureteral colic, UTI/pyelonephritis, ovarian torsion or ovarian cyst.  Vital signs are normal and afebrile.  Urine pregnancy test is negative.  She has a leukocytosis of 16.4 and a normal comprehensive metabolic panel.  UA is unremarkable.  Presentation strongly suggestive of appendicitis.  IV, morphine 4 mg IV, Zofran 4 mg IV, 1 L normal saline, CT abdomen pelvis with IV contrast only given her nausea.  Clinical Course as of Dec 09 335  [facility] Dec 09, 2018  0037 Probable early appendicitis.  Called and left message with Dr. Lysle Pearl with general surgery.  CT abd/pelvis w/ IV contrast [CF]  0055 I discussed the case in person with Dr. Lysle Pearl.  He agrees that this is an uncomplicated appendicitis.  He will admit the patient but she will not likely need to go to surgery before tomorrow afternoon.  He thought that the normal COVID-19 test would be appropriate rather than the rapid 2-hour because he is going to be in the operating room for most of the night tonight and she will not go until tomorrow afternoon by his assessment.  I have placed the COVID-19 order and Dr. Lysle Pearl is in the room currently talking to the patient.  I will hold off on antibiotics unless he requests otherwise, I anticipate he will want some antibiotics but I will verify with him first.   [CF]  0120 Ceftriaxone and Flagyl ordered by Dr. Lysle Pearl   [CF]    Clinical Course User Index [CF] Hinda Kehr, MD     ____________________________________________  FINAL CLINICAL IMPRESSION(S) / ED DIAGNOSES  Final diagnoses:  Acute appendicitis with localized peritonitis, without perforation, abscess, or gangrene      MEDICATIONS GIVEN DURING THIS VISIT:  Medications  morphine 4 MG/ML injection 4 mg (4 mg Intravenous Given 12/09/18 0003)  ondansetron (ZOFRAN) injection 4 mg (4 mg Intravenous Given 12/09/18 0002)  sodium chloride 0.9 % bolus 1,000 mL (1,000 mLs Intravenous New Bag/Given 12/09/18 0002)  iohexol (OMNIPAQUE) 350 MG/ML injection 100 mL (100 mLs Intravenous Contrast Given 12/09/18 0015)     ED Discharge Orders    None      *Please note:  FLORAINE BUECHLER was evaluated in Emergency Department on 12/09/2018 for the symptoms described in the history of present illness. She was evaluated in the context of the global COVID-19 pandemic, which necessitated consideration that the patient might be at risk for infection with the SARS-CoV-2 virus that causes COVID-19. Institutional protocols and algorithms that pertain to the evaluation of patients at risk for COVID-19 are in a state of rapid change based on information released by regulatory bodies including the CDC and federal and state organizations. These policies and algorithms were followed during the patient's care in the ED.  Some ED evaluations and interventions may be delayed as a result of limited staffing during the pandemic.*  Note:  This document was prepared using Dragon voice recognition software and may include unintentional dictation errors.   Hinda Kehr, MD 12/09/18 [phone]

## 2018-12-08 NOTE — ED Triage Notes (Signed)
Pt arrived via POV with reports of RLQ abdominal and right flank pain, denies any hx of kidney stones, c/o nausea.  Pt has hx of ectopic pregnancy on L side, but states the pain feels different on the right.

## 2018-12-09 ENCOUNTER — Observation Stay: Payer: 59 | Admitting: Anesthesiology

## 2018-12-09 ENCOUNTER — Encounter
Admission: EM | Disposition: A | Discharge status: Home / Self Care | Payer: Self-pay | Source: Home / Self Care | Attending: Emergency Medicine

## 2018-12-09 ENCOUNTER — Encounter: Payer: Self-pay | Admitting: Anesthesiology

## 2018-12-09 ENCOUNTER — Emergency Department: Payer: 59

## 2018-12-09 DIAGNOSIS — K3580 Unspecified acute appendicitis: Secondary | ICD-10-CM | POA: Diagnosis present

## 2018-12-09 HISTORY — PX: LAPAROSCOPIC APPENDECTOMY: SHX408

## 2018-12-09 LAB — CREATININE, SERUM
Creatinine, Ser: 0.58 mg/dL (ref 0.44–1.00)
GFR calc Af Amer: >60 mL/min (ref >60)
GFR calc non Af Amer: >60 mL/min (ref >60)

## 2018-12-09 LAB — CBC
HCT: 40.7 % (ref 36.0–46.0)
Hemoglobin: 14.3 g/dL (ref 12.0–15.0)
MCH: 31.2 pg (ref 26.0–34.0)
MCHC: 35.1 g/dL (ref 30.0–36.0)
MCV: 88.7 fL (ref 80.0–100.0)
Platelets: 212 10*3/uL (ref 150–400)
RBC: 4.59 MIL/uL (ref 3.87–5.11)
RDW: 11.7 % (ref 11.5–15.5)
WBC: 13.1 10*3/uL — ABNORMAL HIGH (ref 4.0–10.5)
nRBC: 0 % (ref 0.0–0.2)

## 2018-12-09 LAB — HIV ANTIBODY (ROUTINE TESTING W REFLEX): HIV Screen 4th Generation wRfx: NONREACTIVE

## 2018-12-09 LAB — SARS CORONAVIRUS 2 (TAT 6-24 HRS): SARS Coronavirus 2: NEGATIVE

## 2018-12-09 SURGERY — APPENDECTOMY, LAPAROSCOPIC
Anesthesia: General

## 2018-12-09 MED ORDER — HYDROMORPHONE HCL 1 MG/ML IJ SOLN
INTRAMUSCULAR | Status: DC | PRN
  Administered 2018-12-09 (×2): 0.5 mg via INTRAVENOUS

## 2018-12-09 MED ORDER — FENTANYL CITRATE (PF) 100 MCG/2ML IJ SOLN
25.0000 ug | INTRAMUSCULAR | Status: DC | PRN
  Administered 2018-12-09: 50 ug via INTRAVENOUS
  Administered 2018-12-09 (×2): 25 ug via INTRAVENOUS
  Administered 2018-12-09: 13:00:00 50 ug via INTRAVENOUS
  Administered 2018-12-09: 14:00:00 25 ug via INTRAVENOUS

## 2018-12-09 MED ORDER — SUGAMMADEX SODIUM 500 MG/5ML IV SOLN
INTRAVENOUS | Status: DC | PRN
  Administered 2018-12-09: 300 mg via INTRAVENOUS

## 2018-12-09 MED ORDER — IBUPROFEN 800 MG PO TABS
ORAL_TABLET | ORAL | Status: AC
  Filled 2018-12-09: qty 1

## 2018-12-09 MED ORDER — ACETAMINOPHEN 10 MG/ML IV SOLN
INTRAVENOUS | Status: AC
  Filled 2018-12-09: qty 100

## 2018-12-09 MED ORDER — ONDANSETRON HCL 4 MG/2ML IJ SOLN
INTRAMUSCULAR | Status: DC | PRN
  Administered 2018-12-09: 4 mg via INTRAVENOUS

## 2018-12-09 MED ORDER — IPRATROPIUM-ALBUTEROL 0.5-2.5 (3) MG/3ML IN SOLN: 3.0000 mL | RESPIRATORY_TRACT | Status: DC

## 2018-12-09 MED ORDER — FENTANYL CITRATE (PF) 100 MCG/2ML IJ SOLN
INTRAMUSCULAR | Status: DC | PRN
  Administered 2018-12-09 (×4): 50 ug via INTRAVENOUS

## 2018-12-09 MED ORDER — HYDROCODONE-ACETAMINOPHEN 5-325 MG PO TABS
1.0000 | ORAL_TABLET | ORAL | Status: DC | PRN
  Administered 2018-12-09 – 2018-12-10 (×4): 2 via ORAL
  Filled 2018-12-09 (×4): qty 2

## 2018-12-09 MED ORDER — BUDESONIDE 0.25 MG/2ML IN SUSP: 0.2500 mg | Freq: Two times a day (BID) | RESPIRATORY_TRACT | Status: DC

## 2018-12-09 MED ORDER — PROPOFOL 10 MG/ML IV BOLUS
INTRAVENOUS | Status: DC | PRN
  Administered 2018-12-09: 200 mg via INTRAVENOUS

## 2018-12-09 MED ORDER — IOHEXOL 350 MG/ML SOLN
100.0000 mL | Freq: Once | INTRAVENOUS | Status: AC | PRN
  Administered 2018-12-09: 100 mL via INTRAVENOUS

## 2018-12-09 MED ORDER — ONDANSETRON HCL 4 MG PO TABS
4.0000 mg | ORAL_TABLET | Freq: Three times a day (TID) | ORAL | Status: DC | PRN
  Filled 2018-12-09: qty 1

## 2018-12-09 MED ORDER — IBUPROFEN 600 MG PO TABS
ORAL_TABLET | ORAL | Status: AC
  Filled 2018-12-09: qty 1

## 2018-12-09 MED ORDER — BUPIVACAINE-EPINEPHRINE 0.5% -1:200000 IJ SOLN
INTRAMUSCULAR | Status: DC | PRN
  Administered 2018-12-09: 16 mL

## 2018-12-09 MED ORDER — ALBUTEROL SULFATE (2.5 MG/3ML) 0.083% IN NEBU: 2.5000 mg | INHALATION_SOLUTION | Freq: Four times a day (QID) | RESPIRATORY_TRACT | Status: DC | PRN

## 2018-12-09 MED ORDER — TRAMADOL HCL 50 MG PO TABS
50.0000 mg | ORAL_TABLET | Freq: Four times a day (QID) | ORAL | Status: DC | PRN
  Administered 2018-12-09 (×2): 50 mg via ORAL
  Filled 2018-12-09: qty 1

## 2018-12-09 MED ORDER — DEXAMETHASONE SODIUM PHOSPHATE 10 MG/ML IJ SOLN
INTRAMUSCULAR | Status: DC | PRN
  Administered 2018-12-09: 10 mg via INTRAVENOUS

## 2018-12-09 MED ORDER — LACTATED RINGERS IV SOLN
INTRAVENOUS | Status: DC
  Administered 2018-12-09 (×4): via INTRAVENOUS

## 2018-12-09 MED ORDER — FAMOTIDINE 20 MG PO TABS
20.0000 mg | ORAL_TABLET | Freq: Two times a day (BID) | ORAL | Status: DC
  Administered 2018-12-09 – 2018-12-10 (×2): 20 mg via ORAL
  Filled 2018-12-09 (×2): qty 1

## 2018-12-09 MED ORDER — MIDAZOLAM HCL 2 MG/2ML IJ SOLN
INTRAMUSCULAR | Status: AC
  Filled 2018-12-09: qty 2

## 2018-12-09 MED ORDER — SODIUM CHLORIDE 0.9 % IV SOLN
2.0000 g | INTRAVENOUS | Status: DC
  Administered 2018-12-09: 2 g via INTRAVENOUS
  Filled 2018-12-09 (×2): qty 20

## 2018-12-09 MED ORDER — ENOXAPARIN SODIUM 40 MG/0.4ML ~~LOC~~ SOLN
40.0000 mg | Freq: Two times a day (BID) | SUBCUTANEOUS | Status: DC
  Administered 2018-12-10: 10:00:00 40 mg via SUBCUTANEOUS
  Filled 2018-12-09: qty 0.4

## 2018-12-09 MED ORDER — PROMETHAZINE HCL 25 MG PO TABS
ORAL_TABLET | ORAL | Status: AC
  Administered 2018-12-09: 25 mg via ORAL
  Filled 2018-12-09: qty 1

## 2018-12-09 MED ORDER — OXYCODONE HCL 5 MG PO TABS
ORAL_TABLET | ORAL | Status: AC
  Filled 2018-12-09: qty 1

## 2018-12-09 MED ORDER — ROCURONIUM BROMIDE 100 MG/10ML IV SOLN
INTRAVENOUS | Status: DC | PRN
  Administered 2018-12-09: 20 mg via INTRAVENOUS
  Administered 2018-12-09: 10 mg via INTRAVENOUS
  Administered 2018-12-09: 40 mg via INTRAVENOUS

## 2018-12-09 MED ORDER — BECLOMETHASONE DIPROPIONATE 80 MCG/ACT IN AERS: 2.0000 | INHALATION_SPRAY | Freq: Two times a day (BID) | RESPIRATORY_TRACT | Status: DC

## 2018-12-09 MED ORDER — ACETAMINOPHEN 325 MG PO TABS
650.0000 mg | ORAL_TABLET | Freq: Four times a day (QID) | ORAL | Status: DC | PRN
  Administered 2018-12-09: 650 mg via ORAL
  Filled 2018-12-09: qty 2

## 2018-12-09 MED ORDER — IBUPROFEN 400 MG PO TABS
600.0000 mg | ORAL_TABLET | Freq: Four times a day (QID) | ORAL | Status: DC | PRN
  Administered 2018-12-09: 600 mg via ORAL
  Filled 2018-12-09 (×2): qty 1

## 2018-12-09 MED ORDER — TRAMADOL HCL 50 MG PO TABS
ORAL_TABLET | ORAL | Status: AC
  Administered 2018-12-09: 50 mg via ORAL
  Filled 2018-12-09: qty 1

## 2018-12-09 MED ORDER — IPRATROPIUM-ALBUTEROL 0.5-2.5 (3) MG/3ML IN SOLN
3.0000 mL | Freq: Once | RESPIRATORY_TRACT | Status: AC
  Administered 2018-12-09: 13:00:00 3 mL via RESPIRATORY_TRACT

## 2018-12-09 MED ORDER — BUDESONIDE 0.25 MG/2ML IN SUSP: 0.2500 mg | Freq: Two times a day (BID) | RESPIRATORY_TRACT | Status: DC | PRN

## 2018-12-09 MED ORDER — PROPOFOL 10 MG/ML IV BOLUS
INTRAVENOUS | Status: AC
  Filled 2018-12-09: qty 20

## 2018-12-09 MED ORDER — ALPRAZOLAM 0.5 MG PO TABS
1.0000 mg | ORAL_TABLET | Freq: Three times a day (TID) | ORAL | Status: DC | PRN
  Administered 2018-12-09: 1 mg via ORAL
  Filled 2018-12-09: qty 2

## 2018-12-09 MED ORDER — METRONIDAZOLE IN NACL 5-0.79 MG/ML-% IV SOLN
500.0000 mg | Freq: Three times a day (TID) | INTRAVENOUS | Status: DC
  Administered 2018-12-09 – 2018-12-10 (×5): 500 mg via INTRAVENOUS
  Filled 2018-12-09 (×10): qty 100

## 2018-12-09 MED ORDER — OXYCODONE HCL 5 MG PO TABS
5.0000 mg | ORAL_TABLET | Freq: Once | ORAL | Status: AC | PRN
  Administered 2018-12-09: 5 mg via ORAL

## 2018-12-09 MED ORDER — BUPIVACAINE HCL (PF) 0.5 % IJ SOLN
INTRAMUSCULAR | Status: AC
  Filled 2018-12-09: qty 30

## 2018-12-09 MED ORDER — MORPHINE SULFATE (PF) 2 MG/ML IV SOLN
2.0000 mg | INTRAVENOUS | Status: DC | PRN
  Administered 2018-12-09 (×2): 2 mg via INTRAVENOUS
  Filled 2018-12-09 (×2): qty 1

## 2018-12-09 MED ORDER — HYDROMORPHONE HCL 1 MG/ML IJ SOLN
INTRAMUSCULAR | Status: AC
  Filled 2018-12-09: qty 1

## 2018-12-09 MED ORDER — PROMETHAZINE HCL 25 MG PO TABS
25.0000 mg | ORAL_TABLET | Freq: Three times a day (TID) | ORAL | Status: DC | PRN
  Administered 2018-12-09: 16:00:00 25 mg via ORAL

## 2018-12-09 MED ORDER — SUCCINYLCHOLINE CHLORIDE 20 MG/ML IJ SOLN
INTRAMUSCULAR | Status: DC | PRN
  Administered 2018-12-09: 180 mg via INTRAVENOUS

## 2018-12-09 MED ORDER — LIDOCAINE HCL (CARDIAC) PF 100 MG/5ML IV SOSY
PREFILLED_SYRINGE | INTRAVENOUS | Status: DC | PRN
  Administered 2018-12-09: 100 mg via INTRAVENOUS

## 2018-12-09 MED ORDER — IPRATROPIUM-ALBUTEROL 0.5-2.5 (3) MG/3ML IN SOLN
RESPIRATORY_TRACT | Status: AC
  Administered 2018-12-09: 3 mL via RESPIRATORY_TRACT
  Filled 2018-12-09: qty 3

## 2018-12-09 MED ORDER — ACETAMINOPHEN 10 MG/ML IV SOLN
INTRAVENOUS | Status: DC | PRN
  Administered 2018-12-09: 1000 mg via INTRAVENOUS

## 2018-12-09 MED ORDER — MIDAZOLAM HCL 2 MG/2ML IJ SOLN
INTRAMUSCULAR | Status: DC | PRN
  Administered 2018-12-09: 2 mg via INTRAVENOUS

## 2018-12-09 MED ORDER — TRAZODONE HCL 50 MG PO TABS
50.0000 mg | ORAL_TABLET | Freq: Every evening | ORAL | Status: DC | PRN
  Administered 2018-12-09: 50 mg via ORAL
  Filled 2018-12-09 (×2): qty 1

## 2018-12-09 MED ORDER — FLUCONAZOLE 50 MG PO TABS
150.0000 mg | ORAL_TABLET | Freq: Once | ORAL | Status: AC
  Administered 2018-12-09: 150 mg via ORAL
  Filled 2018-12-09: qty 1

## 2018-12-09 MED ORDER — ALBUTEROL SULFATE HFA 108 (90 BASE) MCG/ACT IN AERS
INHALATION_SPRAY | RESPIRATORY_TRACT | Status: AC
  Filled 2018-12-09: qty 6.7

## 2018-12-09 MED ORDER — OXYCODONE HCL 5 MG/5ML PO SOLN: 5.0000 mg | Freq: Once | ORAL | Status: AC | PRN

## 2018-12-09 MED ORDER — FENTANYL CITRATE (PF) 250 MCG/5ML IJ SOLN
INTRAMUSCULAR | Status: AC
  Filled 2018-12-09: qty 5

## 2018-12-09 MED ORDER — LOSARTAN POTASSIUM 25 MG PO TABS
25.0000 mg | ORAL_TABLET | Freq: Every day | ORAL | Status: DC
  Administered 2018-12-10: 25 mg via ORAL
  Filled 2018-12-09 (×2): qty 1

## 2018-12-09 MED ORDER — ALBUTEROL SULFATE HFA 108 (90 BASE) MCG/ACT IN AERS
2.0000 | INHALATION_SPRAY | Freq: Four times a day (QID) | RESPIRATORY_TRACT | Status: DC | PRN
  Administered 2018-12-09: 11:00:00 2 via RESPIRATORY_TRACT

## 2018-12-09 MED ORDER — EPINEPHRINE PF 1 MG/ML IJ SOLN
INTRAMUSCULAR | Status: AC
  Filled 2018-12-09: qty 1

## 2018-12-09 MED ORDER — HYDROXYCHLOROQUINE SULFATE 200 MG PO TABS
200.0000 mg | ORAL_TABLET | Freq: Two times a day (BID) | ORAL | Status: DC
  Administered 2018-12-09 – 2018-12-10 (×2): 200 mg via ORAL
  Filled 2018-12-09 (×2): qty 1

## 2018-12-09 MED ORDER — FENTANYL CITRATE (PF) 100 MCG/2ML IJ SOLN
INTRAMUSCULAR | Status: AC
  Administered 2018-12-09: 50 ug via INTRAVENOUS
  Filled 2018-12-09: qty 2

## 2018-12-09 MED ORDER — FENTANYL CITRATE (PF) 100 MCG/2ML IJ SOLN
INTRAMUSCULAR | Status: AC
  Administered 2018-12-09: 25 ug via INTRAVENOUS
  Filled 2018-12-09: qty 2

## 2018-12-09 MED ORDER — DOCUSATE SODIUM 100 MG PO CAPS: 100.0000 mg | ORAL_CAPSULE | Freq: Two times a day (BID) | ORAL | Status: DC | PRN

## 2018-12-09 SURGICAL SUPPLY — 47 items
ANCHOR TIS RET SYS 235ML (MISCELLANEOUS) ×3 IMPLANT
APPLIER CLIP 5 13 M/L LIGAMAX5 (MISCELLANEOUS) ×3
BLADE SURG SZ11 CARB STEEL (BLADE) ×3 IMPLANT
CANISTER SUCT 1200ML W/VALVE (MISCELLANEOUS) ×3 IMPLANT
CLIP APPLIE 5 13 M/L LIGAMAX5 (MISCELLANEOUS) ×1 IMPLANT
COVER WAND RF STERILE (DRAPES) IMPLANT
CUTTER FLEX LINEAR 45M (STAPLE) ×3 IMPLANT
DEFOGGER SCOPE WARMER CLEARIFY (MISCELLANEOUS) ×3 IMPLANT
DERMABOND ADVANCED (GAUZE/BANDAGES/DRESSINGS) ×2
DERMABOND ADVANCED .7 DNX12 (GAUZE/BANDAGES/DRESSINGS) ×1 IMPLANT
ELECT REM PT RETURN 9FT ADLT (ELECTROSURGICAL) ×3
ELECTRODE REM PT RTRN 9FT ADLT (ELECTROSURGICAL) ×1 IMPLANT
GLOVE BIOGEL PI IND STRL 7.0 (GLOVE) ×1 IMPLANT
GLOVE BIOGEL PI INDICATOR 7.0 (GLOVE) ×2
GLOVE SURG SYN 6.5 ES PF (GLOVE) ×3 IMPLANT
GOWN STRL REUS W/ TWL LRG LVL3 (GOWN DISPOSABLE) ×1 IMPLANT
GOWN STRL REUS W/TWL LRG LVL3 (GOWN DISPOSABLE) ×2
GRASPER SUT TROCAR 14GX15 (MISCELLANEOUS) ×3 IMPLANT
HANDLE YANKAUER SUCT BULB TIP (MISCELLANEOUS) ×3 IMPLANT
HEMOSTAT SURGICEL 2X3 (HEMOSTASIS) ×3 IMPLANT
IRRIGATION STRYKERFLOW (MISCELLANEOUS) IMPLANT
IRRIGATOR STRYKERFLOW (MISCELLANEOUS)
IV NS 1000ML (IV SOLUTION)
IV NS 1000ML BAXH (IV SOLUTION) IMPLANT
JACKSON PRATT 10 (INSTRUMENTS) IMPLANT
KIT TURNOVER KIT A (KITS) ×3 IMPLANT
L-HOOK LAP DISP 36CM (ELECTROSURGICAL) ×3
LHOOK LAP DISP 36CM (ELECTROSURGICAL) ×1 IMPLANT
LIGASURE LAP MARYLAND 5MM 37CM (ELECTROSURGICAL) IMPLANT
NEEDLE HYPO 22GX1.5 SAFETY (NEEDLE) ×3 IMPLANT
PACK LAP CHOLECYSTECTOMY (MISCELLANEOUS) ×3 IMPLANT
PENCIL ELECTRO HAND CTR (MISCELLANEOUS) ×3 IMPLANT
RELOAD 45 VASCULAR/THIN (ENDOMECHANICALS) ×6 IMPLANT
RELOAD STAPLE TA45 3.5 REG BLU (ENDOMECHANICALS) ×3 IMPLANT
SCISSORS METZENBAUM CVD 33 (INSTRUMENTS) ×3 IMPLANT
SET TUBE SMOKE EVAC HIGH FLOW (TUBING) ×3 IMPLANT
SLEEVE ENDOPATH XCEL 5M (ENDOMECHANICALS) ×3 IMPLANT
SUT MNCRL AB 4-0 PS2 18 (SUTURE) ×3 IMPLANT
SUT VIC AB 3-0 SH 27 (SUTURE) ×2
SUT VIC AB 3-0 SH 27X BRD (SUTURE) ×1 IMPLANT
SUT VICRYL 0 AB UR-6 (SUTURE) ×6 IMPLANT
SUT VICRYL PLUS ABS 0 54 (SUTURE) ×3 IMPLANT
TRAY FOLEY MTR SLVR 16FR STAT (SET/KITS/TRAYS/PACK) ×3 IMPLANT
TROCAR 12M 150ML BLUNT (TROCAR) ×3 IMPLANT
TROCAR 5M 150ML BLDLS (TROCAR) ×6 IMPLANT
TROCAR XCEL BLUNT TIP 100MML (ENDOMECHANICALS) IMPLANT
TROCAR XCEL NON-BLD 5MMX100MML (ENDOMECHANICALS) ×3 IMPLANT

## 2018-12-09 NOTE — Anesthesia Postprocedure Evaluation (Signed)
Anesthesia Post Note  Patient: Martha Thompson  Procedure(s) Performed: APPENDECTOMY LAPAROSCOPIC (N/A )  Patient location during evaluation: PACU Anesthesia Type: General Level of consciousness: awake and alert Pain management: pain level controlled Vital Signs Assessment: post-procedure vital signs reviewed and stable Respiratory status: spontaneous breathing and respiratory function stable Cardiovascular status: stable Anesthetic complications: no     Last Vitals:  Vitals:   12/09/18 1252 12/09/18 1254  BP:  133/73  Pulse:  61  Resp: 10 18  Temp:    SpO2:  95%    Last Pain:  Vitals:   12/09/18 1252  TempSrc:   PainSc: 5                  KEPHART,WILLIAM K

## 2018-12-09 NOTE — H&P (Signed)
Subjective:   CC: appendicitis  HPI:  Martha Thompson is a 33 y.o. female who is consulted by York CeriseForbach for evaluation of  above cc.  Symptoms were first noted 2 days ago. Pain is sharp, periumbilical, migrated to RLQ  Associated with anorexia, exacerbated by nothing specific     Past Medical History:  has a past medical history of ALLERGIC RHINITIS (10/30/2009), ANXIETY (09/04/2006), ASTHMA (12/25/2006), ASTHMA, WITH ACUTE EXACERBATION (05/03/2009), BLEPHARITIS, LEFT (05/21/2008), Cervical disc disease, COMMON MIGRAINE (06/14/2007), GANGLION CYST, WRIST, LEFT (10/11/2007), GERD (gastroesophageal reflux disease) (04/24/2012), Headache(784.0) (10/23/2008), HYPERLIPIDEMIA (06/14/2007), LOW BACK PAIN (09/04/2006), Morbid obesity (HCC) (09/04/2006), OTITIS MEDIA, ACUTE, LEFT (03/27/2008), SINUSITIS- ACUTE-NOS (10/11/2007), TENOSYNOVITIS, WRIST (10/11/2007), URI (10/29/2009), and URTICARIA (05/03/2009).  Past Surgical History:  has a past surgical history that includes back surgury (01/2003); laparoscopy (04/07/2011); and Cesarean section (N/A, 02/19/2013).  Family History: family history includes Anxiety disorder in her father; Diabetes in her father.  Social History:  reports that she has been smoking cigarettes. She has been smoking about 0.50 packs per day. She has never used smokeless tobacco. She reports current alcohol use. She reports that she does not use drugs.  Current Medications:  Medications Prior to Admission  Medication Sig Dispense Refill  . ALPRAZolam (XANAX) 1 MG tablet TAKE 1 TABLET BY MOUTH THREE TIMES A DAY AS NEEDED FOR ANXIETY 90 tablet 2  . amoxicillin (AMOXIL) 500 MG capsule Take 2 capsules (1,000 mg total) by mouth 2 (two) times daily. 40 capsule 0  . cetirizine (ZYRTEC) 5 MG tablet Take 10 mg by mouth 2 (two) times daily.    Marland Kitchen. EPINEPHrine 0.3 mg/0.3 mL IJ SOAJ injection Inject 0.3 mg into the muscle as needed.    . famotidine (PEPCID) 20 MG tablet Take 20 mg by mouth 2 (two) times  daily.    . hydroxychloroquine (PLAQUENIL) 200 MG tablet Take 200 mg by mouth 2 (two) times daily.    Marland Kitchen. levonorgestrel (MIRENA, 52 MG,) 20 MCG/24HR IUD 52 mg by Intrauterine route as directed.    Marland Kitchen. losartan (COZAAR) 25 MG tablet TAKE 1 TABLET BY MOUTH EVERY DAY 90 tablet 3  . omalizumab (XOLAIR) 150 MG/ML prefilled syringe Inject 300 mg into the skin See admin instructions. INJECT 2 (150 MG) SYRINGES UNDER THE SKIN FOR A TOTAL DOSE OF 300MG  EVERY 4 WEEKS.    Marland Kitchen. PROAIR HFA 108 (90 Base) MCG/ACT inhaler TAKE 2 PUFFS BY MOUTH EVERY 6 HOURS AS NEEDED 25.5 Inhaler 3  . promethazine (PHENERGAN) 25 MG tablet Take 1 tablet (25 mg total) by mouth every 8 (eight) hours as needed for nausea or vomiting. 20 tablet 1  . traZODone (DESYREL) 50 MG tablet TAKE ONE-HALF TO ONE TABLET BY MOUTH AT BEDTIME AS NEEDED FOR SLEEP 90 tablet 1  . azithromycin (ZITHROMAX Z-PAK) 250 MG tablet 2 tab by mouth day 1, then 1 per day (Patient not taking: Reported on 12/09/2018) 6 tablet 1  . beclomethasone (QVAR) 80 MCG/ACT inhaler Inhale 2 puffs into the lungs 2 (two) times daily. (Patient not taking: Reported on 12/09/2018) 1 Inhaler 12  . ondansetron (ZOFRAN) 4 MG tablet Take 1 tablet (4 mg total) by mouth every 8 (eight) hours as needed for nausea or vomiting. (Patient not taking: Reported on 12/09/2018) 30 tablet 0    Allergies:  Allergies as of 12/08/2018 - Review Complete 12/08/2018  Allergen Reaction Noted  . Prozac [fluoxetine hcl] Other (See Comments) 08/13/2015    ROS:  General: Denies weight loss, weight gain, fatigue,  fevers, chills, and night sweats. Eyes: Denies blurry vision, double vision, eye pain, itchy eyes, and tearing. Ears: Denies hearing loss, earache, and ringing in ears. Nose: Denies sinus pain, congestion, infections, runny nose, and nosebleeds. Mouth/throat: Denies hoarseness, sore throat, bleeding gums, and difficulty swallowing. Heart: Denies chest pain, palpitations, racing heart, irregular  heartbeat, leg pain or swelling, and decreased activity tolerance. Respiratory: Denies breathing difficulty, shortness of breath, wheezing, cough, and sputum. GI: Denies change in appetite, heartburn, nausea, vomiting, constipation, diarrhea, and blood in stool. GU: Denies difficulty urinating, pain with urinating, urgency, frequency, blood in urine. Musculoskeletal: Denies joint stiffness, pain, swelling, muscle weakness. Skin: Denies rash, itching, mass, tumors, sores, and boils Neurologic: Denies headache, fainting, dizziness, seizures, numbness, and tingling. Psychiatric: Denies depression, anxiety, difficulty sleeping, and memory loss. Endocrine: Denies heat or cold intolerance, and increased thirst or urination. Blood/lymph: Denies easy bruising, easy bruising, and swollen glands     Objective:     BP 129/86   Pulse 75   Temp (!) 97.3 F (36.3 C) (Temporal)   Resp 16   Ht 5\' 11"  (1.803 m)   Wt (!) 136.5 kg   SpO2 97%   Breastfeeding No Comment: neg preg test  BMI 41.98 kg/m    Constitutional :  alert, cooperative, appears stated age and no distress  Lymphatics/Throat:  no asymmetry, masses, or scars  Respiratory:  clear to auscultation bilaterally  Cardiovascular:  regular rate and rhythm  Gastrointestinal: soft, no guarding, TTP in RLQ.   Musculoskeletal: Steady gait and movement  Skin: Cool and moist  Psychiatric: Normal affect, non-agitated, not confused       LABS:  CMP Latest Ref Rng & Units 12/08/2018 02/22/2018 02/19/2017  Glucose 70 - 99 mg/dL 105(H) 71 90  BUN 6 - 20 mg/dL 13 14 10   Creatinine 0.44 - 1.00 mg/dL 0.63 0.72 0.61  Sodium 135 - 145 mmol/L 137 140 139  Potassium 3.5 - 5.1 mmol/L 3.9 4.1 3.9  Chloride 98 - 111 mmol/L 101 105 106  CO2 22 - 32 mmol/L 23 27 24   Calcium 8.9 - 10.3 mg/dL 9.4 9.5 9.7  Total Protein 6.5 - 8.1 g/dL 7.4 7.2 7.6  Total Bilirubin 0.3 - 1.2 mg/dL 0.5 0.6 0.5  Alkaline Phos 38 - 126 U/L 41 43 44  AST 15 - 41 U/L 20 14 12    ALT 0 - 44 U/L 21 16 17    CBC Latest Ref Rng & Units 12/08/2018 02/22/2018 02/19/2017  WBC 4.0 - 10.5 K/uL 16.4(H) 9.5 11.8(H)  Hemoglobin 12.0 - 15.0 g/dL 15.1(H) 14.8 15.5(H)  Hematocrit 36.0 - 46.0 % 43.9 43.2 45.6  Platelets 150 - 400 K/uL 215 225.0 236.0     RADS: CLINICAL DATA:  Right lower quadrant, right flank pain  EXAM: CT ABDOMEN AND PELVIS WITH CONTRAST  TECHNIQUE: Multidetector CT imaging of the abdomen and pelvis was performed using the standard protocol following bolus administration of intravenous contrast.  CONTRAST:  142mL OMNIPAQUE IOHEXOL 350 MG/ML SOLN  COMPARISON:  None.  FINDINGS: Lower chest: Lung bases are clear. No effusions. Heart is normal size.  Hepatobiliary: No focal hepatic abnormality. Gallbladder unremarkable.  Pancreas: No focal abnormality or ductal dilatation.  Spleen: No focal abnormality.  Normal size.  Adrenals/Urinary Tract: No adrenal abnormality. No focal renal abnormality. No stones or hydronephrosis. Urinary bladder is unremarkable.  Stomach/Bowel: Appendix is borderline dilated, measuring 8 mm. Early surrounding inflammation. Findings concerning for early acute appendicitis. Stomach, large and small bowel grossly unremarkable.  Vascular/Lymphatic: No evidence of aneurysm or adenopathy.  Reproductive: Uterus and adnexa unremarkable. No mass. IUD noted in the uterus.  Other: No free fluid or free air.  Musculoskeletal: No acute bony abnormality.  IMPRESSION: Borderline size of the appendix with surrounding early inflammatory change. Findings concerning for early acute appendicitis.   Electronically Signed   By: Charlett Nose M.D.   On: 12/09/2018 00:27 Assessment:      Acute appendicitis  Plan:      Discussed the risk of surgery including post-op infxn, seroma, hematoma, abscess formation, chronic pain, poor-delayed wound healing, possible bowel resection, possible ostomy, possible  conversion to open procedure, post-op SBO or ileus, and need for additional procedures to address said risks.  The risks of general anesthetic including MI, CVA, sudden death or even reaction to anesthetic medications also discussed. Alternatives include continued observation, or antibiotic treatment.  Benefits include possible symptom relief,   Typical post operative recovery of 3-5 days rest, also discussed.  The patient understands the risks, any and all questions were answered to the patient's satisfaction.  to OR for lap appy

## 2018-12-09 NOTE — Anesthesia Procedure Notes (Signed)
Procedure Name: Intubation Date/Time: 12/09/2018 10:23 AM Performed by: Nelda Marseille, CRNA Pre-anesthesia Checklist: Patient identified, Patient being monitored, Timeout performed, Emergency Drugs available and Suction available Patient Re-evaluated:Patient Re-evaluated prior to induction Oxygen Delivery Method: Circle system utilized Preoxygenation: Pre-oxygenation with 100% oxygen Induction Type: IV induction Ventilation: Mask ventilation without difficulty Laryngoscope Size: Mac, 3 and Glidescope Grade View: Grade II Tube type: Oral Tube size: 7.0 mm Number of attempts: 1 Airway Equipment and Method: Stylet Placement Confirmation: ETT inserted through vocal cords under direct vision,  positive ETCO2 and breath sounds checked- equal and bilateral Secured at: 21 cm Tube secured with: Tape Dental Injury: Teeth and Oropharynx as per pre-operative assessment

## 2018-12-09 NOTE — Op Note (Signed)
Preoperative diagnosis: Acute appendicitis.  Postoperative diagnosis: Acute appendicitis  Procedure: Laparoscopic appendectomy.  Anesthesia: GETA  Surgeon: Benjamine Sprague  Wound Classification: clean contaminated  Specimen: Appendix  Complications: None  Estimated Blood Loss: 30 mL   Indications: Patient is a 33 y.o. female  presented with right lower quadrant pain.  Computed tomography scan and physical examination were consistent with acute appendicitis.   Findings: 1. Acutely inflamed appendix 2. No peri-appendiceal abscess or phlegmon 3. Normal anatomy 4. Appendiceal artery ligated and divided with EndoGIA 5. Adequate hemostasis.   Description of procedure: The patient was placed on the operating table in the supine position, left arm tucked. General anesthesia was induced. A time-out was completed verifying correct patient, procedure, site, positioning, and implant(s) and/or special equipment prior to beginning this procedure. A Foley catheter placed. The abdomen was prepped and draped in the usual sterile fashion.   Palmer's point was chosen to insert a Veress needle.  After confirming positive saline drop test, patient started and gradual increase in pressure was noted up to 15 mmHg.  Veress needle was then removed and a 5 mm port was placed in the same area via the Optiview technique.  After entering the abdominal cavity inspection of the area noted no damage to surrounding tissue.  After local was infused, an incision was made inferior to the umbilicus.   12 mm port then placed under direct visualization.  One 59mm port and another 5-mm port was then placed above the symphysis pubis on midline and LLQ in an identical manner.  Care was taken to avoid injury to the bladder or inferior epigastric vessels. The table was placed in the Trendelenburg position with the right side elevated.  An inflamed appendix was identified and elevated.  Window created at base of appendix in the  mesentery.   An endoscopic blue load linear cutting stapler was then used to divide and staple the base of the appendix. It was reloaded with a vascular cartridge and the mesoappendix similarly divided.  The appendix was placed in an endoscopic retrieval bag and removed.   The appendiceal stump was examined and hemostasis noted. Scant serosanguinous fluid and minimal blood suctioned out.  No other pathology was identified within pelvis.  Surgicel placed over the staple lines to ensure further hemostasis.  The umbilical trocar removed and port site closed with PMI using 0 vicryl under direct vision. Remaining trocars were removed under direct vision. No bleeding was noted.The abdomen was allowed to collapse.   All skin incisions then closed with subcuticular sutures Monocryl 4-0.  Wounds then dressed with dermabond.  The patient tolerated the procedure well, foley removed, awakened from anesthesia and was taken to the postanesthesia care unit in satisfactory condition.  Sponge count and instrument count correct at the end of the procedure.

## 2018-12-09 NOTE — Progress Notes (Signed)
Patient placed on 2L [place] for support. Was running 91%-87% on room air. Patient has a history of asthma. Had breathing treatment in PACU.

## 2018-12-09 NOTE — Anesthesia Preprocedure Evaluation (Signed)
Anesthesia Evaluation  Patient identified by MRN, date of birth, ID band Patient awake    Reviewed: Allergy & Precautions, H&P , NPO status , Patient's Chart, lab work & pertinent test results  History of Anesthesia Complications Negative for: history of anesthetic complications  Airway Mallampati: III  TM Distance: >3 FB Neck ROM: full    Dental  (+) Chipped   Pulmonary neg shortness of breath, asthma , Current Smoker,           Cardiovascular Exercise Tolerance: Good hypertension, (-) angina(-) Past MI and (-) DOE      Neuro/Psych  Headaches, PSYCHIATRIC DISORDERS  Neuromuscular disease    GI/Hepatic Neg liver ROS, GERD  ,  Endo/Other  negative endocrine ROS  Renal/GU      Musculoskeletal  (+) Arthritis ,   Abdominal   Peds  Hematology negative hematology ROS (+)   Anesthesia Other Findings Past Medical History: 10/30/2009: ALLERGIC RHINITIS 09/04/2006: ANXIETY 12/25/2006: ASTHMA     Comment:  inhaler used 2 days ago 05/03/2009: ASTHMA, WITH ACUTE EXACERBATION 05/21/2008: BLEPHARITIS, LEFT No date: Cervical disc disease     Comment:  2 bulging discs to neck  06/14/2007: COMMON MIGRAINE 10/11/2007: GANGLION CYST, WRIST, LEFT 04/24/2012: GERD (gastroesophageal reflux disease) 10/23/2008: Headache(784.0) 06/14/2007: HYPERLIPIDEMIA 09/04/2006: LOW BACK PAIN 09/04/2006: Morbid obesity ([place]) 03/27/2008: OTITIS MEDIA, ACUTE, LEFT 10/11/2007: SINUSITIS- ACUTE-NOS 10/11/2007: TENOSYNOVITIS, WRIST 10/29/2009: URI 05/03/2009: URTICARIA  Past Surgical History: 01/2003: back surgury     Comment:  s/p lumbar disc 02/19/2013: CESAREAN SECTION; N/A     Comment:  Procedure: CESAREAN SECTION;  Surgeon: Betsy Coder,               MD;  Location: [facility];  Service: Obstetrics;  Laterality:              N/A; 04/07/2011: LAPAROSCOPY     Comment:  Procedure: LAPAROSCOPY OPERATIVE;  Surgeon: Margarette Asal, MD;   Location: [facility];  Service: Gynecology;                Laterality: N/A;  left salpingogectomy     Reproductive/Obstetrics negative OB ROS                             Anesthesia Physical Anesthesia Plan  ASA: III  Anesthesia Plan: General ETT   Post-op Pain Management:    Induction: Intravenous  PONV Risk Score and Plan: Ondansetron, Dexamethasone, Midazolam and Treatment may vary due to age or medical condition  Airway Management Planned: Oral ETT  Additional Equipment:   Intra-op Plan:   Post-operative Plan: Extubation in OR  Informed Consent: I have reviewed the patients History and Physical, chart, labs and discussed the procedure including the risks, benefits and alternatives for the proposed anesthesia with the patient or authorized representative who has indicated his/her understanding and acceptance.     Dental Advisory Given  Plan Discussed with: Anesthesiologist, CRNA and Surgeon  Anesthesia Plan Comments: (Patient consented for risks of anesthesia including but not limited to:  - adverse reactions to medications - damage to teeth, lips or other oral mucosa - sore throat or hoarseness - Damage to heart, brain, lungs or loss of life  Patient voiced understanding.)        Anesthesia Quick Evaluation

## 2018-12-09 NOTE — ED Notes (Signed)
Pt assisted to the bathroom.

## 2018-12-09 NOTE — Anesthesia Post-op Follow-up Note (Signed)
Anesthesia QCDR form completed.        

## 2018-12-09 NOTE — Transfer of Care (Signed)
Immediate Anesthesia Transfer of Care Note  Patient: Martha Thompson  Procedure(s) Performed: APPENDECTOMY LAPAROSCOPIC (N/A )  Patient Location: PACU  Anesthesia Type:General  Level of Consciousness: sedated  Airway & Oxygen Therapy: Patient Spontanous Breathing and Patient connected to face mask oxygen  Post-op Assessment: Report given to RN and Post -op Vital signs reviewed and stable  Post vital signs: Reviewed and stable  Last Vitals:  Vitals Value Taken Time  BP 147/89 12/09/18 1154  Temp 36.3 C 12/09/18 1154  Pulse 80 12/09/18 1206  Resp 12 12/09/18 1206  SpO2 96 % 12/09/18 1206  Vitals shown include unvalidated device data.  Last Pain:  Vitals:   12/09/18 1154  TempSrc:   PainSc: Asleep         Complications: No apparent anesthesia complications

## 2018-12-10 LAB — CBC
HCT: 38 % (ref 36.0–46.0)
Hemoglobin: 12.8 g/dL (ref 12.0–15.0)
MCH: 31.1 pg (ref 26.0–34.0)
MCHC: 33.7 g/dL (ref 30.0–36.0)
MCV: 92.5 fL (ref 80.0–100.0)
Platelets: 210 10*3/uL (ref 150–400)
RBC: 4.11 MIL/uL (ref 3.87–5.11)
RDW: 11.5 % (ref 11.5–15.5)
WBC: 16.5 10*3/uL — ABNORMAL HIGH (ref 4.0–10.5)
nRBC: 0 % (ref 0.0–0.2)

## 2018-12-10 LAB — BASIC METABOLIC PANEL
Anion gap: 8 (ref 5–15)
BUN: 8 mg/dL (ref 6–20)
CO2: 23 mmol/L (ref 22–32)
Calcium: 8.5 mg/dL — ABNORMAL LOW (ref 8.9–10.3)
Chloride: 107 mmol/L (ref 98–111)
Creatinine, Ser: 0.46 mg/dL (ref 0.44–1.00)
GFR calc Af Amer: >60 mL/min (ref >60)
GFR calc non Af Amer: >60 mL/min (ref >60)
Glucose, Bld: 97 mg/dL (ref 70–99)
Potassium: 3.9 mmol/L (ref 3.5–5.1)
Sodium: 138 mmol/L (ref 135–145)

## 2018-12-10 LAB — SURGICAL PATHOLOGY

## 2018-12-10 LAB — MAGNESIUM: Magnesium: 2 mg/dL (ref 1.7–2.4)

## 2018-12-10 LAB — PHOSPHORUS: Phosphorus: 4.1 mg/dL (ref 2.5–4.6)

## 2018-12-10 MED ORDER — IBUPROFEN 800 MG PO TABS: 800.0000 mg | ORAL_TABLET | Freq: Three times a day (TID) | ORAL | 0 refills | Status: DC | PRN

## 2018-12-10 MED ORDER — HYDROCODONE-ACETAMINOPHEN 5-325 MG PO TABS: 1.0000 | ORAL_TABLET | Freq: Four times a day (QID) | ORAL | 0 refills | Status: DC | PRN

## 2018-12-10 MED ORDER — DOCUSATE SODIUM 100 MG PO CAPS: 100.0000 mg | ORAL_CAPSULE | Freq: Two times a day (BID) | ORAL | 0 refills | Status: AC | PRN

## 2018-12-10 MED ORDER — ACETAMINOPHEN 325 MG PO TABS: 650.0000 mg | ORAL_TABLET | Freq: Three times a day (TID) | ORAL | 0 refills | Status: AC | PRN

## 2018-12-10 NOTE — Plan of Care (Signed)
Patient stated she is feeling much better and is ready to go home. Her pain is being controlled with PO meds. Patient is AxO, independent, on RA. Tolerated clear liquids well. Will have regular diet for breakfast.

## 2018-12-10 NOTE — Discharge Instructions (Signed)
Laparoscopic Appendectomy, Care After This sheet gives you information about how to care for yourself after your procedure. Your doctor may also give you more specific instructions. If you have problems or questions, contact your doctor. Follow these instructions at home: Care for cuts from surgery (incisions)   Follow instructions from your doctor about how to take care of your cuts from surgery. Make sure you: ? Wash your hands with soap and water before you change your bandage (dressing). If you cannot use soap and water, use hand sanitizer. ? Change your bandage as told by your doctor. ? Leave stitches (sutures), skin glue, or skin tape (adhesive) strips in place. They may need to stay in place for 2 weeks or longer. If tape strips get loose and curl up, you may trim the loose edges. Do not remove tape strips completely unless your doctor says it is okay.  Do not take baths, swim, or use a hot tub until your doctor says it is okay. OK TO SHOWER 24HRS AFTER YOUR SURGERY.   Check your surgical cut area every day for signs of infection. Check for: ? More redness, swelling, or pain. ? More fluid or blood. ? Warmth. ? Pus or a bad smell. Activity  Do not drive or use heavy machinery while taking prescription pain medicine.  Do not play contact sports until your doctor says it is okay.  Do not drive for 24 hours if you were given a medicine to help you relax (sedative).  Rest as needed. Do not return to work or school until your doctor says it is okay. General instructions .  tylenol and advil as needed for discomfort.  Please alternate between the two every four hours as needed for pain.   .  Use narcotics, if prescribed, only when tylenol and motrin is not enough to control pain. .  325-650mg every 8hrs to max of 3000mg/24hrs (including the 325mg in every norco dose) for the tylenol.   .  Advil up to 800mg per dose every 8hrs as needed for pain.    To prevent or treat constipation  while you are taking prescription pain medicine, your doctor may recommend that you: ? Drink enough fluid to keep your pee (urine) clear or pale yellow. ? Take over-the-counter or prescription medicines. ? Eat foods that are high in fiber, such as fresh fruits and vegetables, whole grains, and beans. ? Limit foods that are high in fat and processed sugars, such as fried and sweet foods. Contact a doctor if:  You develop a rash.  You have more redness, swelling, or pain around your surgical cuts.  You have more fluid or blood coming from your surgical cuts.  Your surgical cuts feel warm to the touch.  You have pus or a bad smell coming from your surgical cuts.  You have a fever.  One or more of your surgical cuts breaks open. Get help right away if:  You have trouble breathing.  You have chest pain.  You have pain that is getting worse in your shoulders.  You faint or feel dizzy when you stand.  You have very bad pain in your belly (abdomen).  You are sick to your stomach (nauseous) for more than one day.  You have throwing up (vomiting) that lasts for more than one day.  You have leg pain. This information is not intended to replace advice given to you by your health care provider. Make sure you discuss any questions you have with your   health care provider. Document Released: 10/05/2007 Document Revised: 07/17/2015 Document Reviewed: 06/14/2015 Elsevier Interactive Patient Education  2019 [employer]   

## 2018-12-10 NOTE — Progress Notes (Signed)
Discharge Summary  Patient ID: Martha Thompson MRN: [id number] DOB/AGE: 07/27/1985 33 y.o.  Admit date: 12/08/2018 DX: Acute appendicitis with localized peritonitis, without perforation, abscess, or gangrene [K35.30]   Discharge date: 12/10/2018 Method of transport: POV Discharge address: [place] [address]   Discharge Exam: IV removed. Belongings gathered. AVS was provided to the patient which included instructions that addressed activity level, diet, discharge medications, follow-up appointments, weight monitoring and what to do if symptoms worsen. All questions answered for patient/caregiver clarification.   Follow-up Information    Benjamine Sprague, DO On 12/25/2018.   Specialty: Surgery Why: post op10:45am appointment Contact information: [address] [place] [address] [phone]           Signed: Lewie Chamber 12/10/2018, 3:36 PM

## 2018-12-12 NOTE — Discharge Summary (Signed)
Physician Discharge Summary  Patient ID: Martha Thompson MRN: [id number] DOB/AGE: [birth year] 33 y.o.  Admit date: 12/08/2018 Discharge date: 12/12/2018  Admission Diagnoses: Acute appendicitis  Discharge Diagnoses:  Same as above  Discharged Condition: good  Hospital Course: Admitted for above.  Underwent uncomplicated lap appendectomy.  Postop recovered as expected.  At time of discharge tolerating diet pain controlled  Consults: None  Discharge Exam: Blood pressure 130/87, pulse 93, temperature 98.4 F (36.9 C), temperature source Oral, resp. rate 20, height 5\' 11"  (1.803 m), weight (!) 136.5 kg, SpO2 98 %, not currently breastfeeding. General appearance: alert, cooperative and no distress GI: Soft, no guarding, appropriate tenderness around incision site.  Visions clean dry and intact.  Disposition:  Discharge disposition: 01-Home or Self Care       Discharge Instructions    Discharge patient   Complete by: As directed    Discharge disposition: 01-Home or Self Care   Discharge patient date: 12/10/2018     Allergies as of 12/10/2018      Reactions   Prozac [fluoxetine Hcl] Other (See Comments)      Medication List    STOP taking these medications   azithromycin 250 MG tablet Commonly known as: Zithromax Z-Pak   beclomethasone 80 MCG/ACT inhaler Commonly known as: Qvar   ondansetron 4 MG tablet Commonly known as: Zofran     TAKE these medications   acetaminophen 325 MG tablet Commonly known as: Tylenol Take 2 tablets (650 mg total) by mouth every 8 (eight) hours as needed for mild pain.   ALPRAZolam 1 MG tablet Commonly known as: XANAX TAKE 1 TABLET BY MOUTH THREE TIMES A DAY AS NEEDED FOR ANXIETY   amoxicillin 500 MG capsule Commonly known as: AMOXIL Take 2 capsules (1,000 mg total) by mouth 2 (two) times daily.   cetirizine 5 MG tablet Commonly known as: ZYRTEC Take 10 mg by mouth 2 (two) times daily.   docusate sodium 100 MG  capsule Commonly known as: Colace Take 1 capsule (100 mg total) by mouth 2 (two) times daily as needed for up to 10 days for mild constipation.   EPINEPHrine 0.3 mg/0.3 mL Soaj injection Commonly known as: EPI-PEN Inject 0.3 mg into the muscle as needed.   famotidine 20 MG tablet Commonly known as: PEPCID Take 20 mg by mouth 2 (two) times daily.   HYDROcodone-acetaminophen 5-325 MG tablet Commonly known as: Norco Take 1 tablet by mouth every 6 (six) hours as needed for up to 6 doses for moderate pain.   hydroxychloroquine 200 MG tablet Commonly known as: PLAQUENIL Take 200 mg by mouth 2 (two) times daily.   ibuprofen 800 MG tablet Commonly known as: ADVIL Take 1 tablet (800 mg total) by mouth every 8 (eight) hours as needed for mild pain or moderate pain.   losartan 25 MG tablet Commonly known as: COZAAR TAKE 1 TABLET BY MOUTH EVERY DAY   Mirena (52 MG) 20 MCG/24HR IUD Generic drug: levonorgestrel 52 mg by Intrauterine route as directed.   ProAir HFA 108 (90 Base) MCG/ACT inhaler Generic drug: albuterol TAKE 2 PUFFS BY MOUTH EVERY 6 HOURS AS NEEDED   promethazine 25 MG tablet Commonly known as: PHENERGAN Take 1 tablet (25 mg total) by mouth every 8 (eight) hours as needed for nausea or vomiting.   traZODone 50 MG tablet Commonly known as: DESYREL TAKE ONE-HALF TO ONE TABLET BY MOUTH AT BEDTIME AS NEEDED FOR SLEEP   Xolair 150 MG/ML prefilled syringe Generic drug: omalizumab Inject  300 mg into the skin See admin instructions. INJECT 2 (150 MG) SYRINGES UNDER THE SKIN FOR A TOTAL DOSE OF 300MG  EVERY 4 WEEKS.      Follow-up Information    Benjamine Sprague, DO On 12/25/2018.   Specialty: Surgery Why: post op10:45am appointment Contact information: [address] [id number] [phone]            Total time spent arranging discharge was >77min. Signed: Benjamine Sprague 12/12/2018, 7:24 AM

## 2019-01-01 ENCOUNTER — Ambulatory Visit (INDEPENDENT_AMBULATORY_CARE_PROVIDER_SITE_OTHER): Payer: 59 | Admitting: Family

## 2019-01-01 DIAGNOSIS — U071 COVID-19: Secondary | ICD-10-CM

## 2019-01-01 MED ORDER — AMOXICILLIN-POT CLAVULANATE 875-125 MG PO TABS: 1.0000 | ORAL_TABLET | Freq: Two times a day (BID) | ORAL | 0 refills | Status: AC

## 2019-01-01 NOTE — Progress Notes (Signed)
Martha Thompson is a 33 y.o. female with the following history as recorded in EpicCare:  Patient Active Problem List   Diagnosis Date Noted  . Acute appendicitis 12/09/2018  . STD exposure 02/19/2017  . Cough 08/08/2016  . Chronic pain of left knee 08/08/2016  . Insomnia 02/08/2016  . Acute left lumbar radiculopathy 09/16/2015  . Hyperglycemia 08/13/2015  . GERD (gastroesophageal reflux disease) 04/24/2012  . Wheezing 04/17/2011  . Preventative health care 04/13/2010  . Allergic rhinitis 10/30/2009  . Acute upper respiratory infection 10/29/2009  . DISC DISEASE, CERVICAL 10/29/2009  . Hives 05/03/2009  . Essential hypertension 10/11/2007  . HYPERLIPIDEMIA 06/14/2007  . Asthma 12/25/2006  . Morbid obesity (HCC) 09/04/2006  . Generalized anxiety disorder 09/04/2006  . Anxiety with depression 09/04/2006  . LOW BACK PAIN 09/04/2006    Current Outpatient Medications  Medication Sig Dispense Refill  . acetaminophen (TYLENOL) 325 MG tablet Take 2 tablets (650 mg total) by mouth every 8 (eight) hours as needed for mild pain. 40 tablet 0  . ALPRAZolam (XANAX) 1 MG tablet TAKE 1 TABLET BY MOUTH THREE TIMES A DAY AS NEEDED FOR ANXIETY 90 tablet 2  . amoxicillin (AMOXIL) 500 MG capsule Take 2 capsules (1,000 mg total) by mouth 2 (two) times daily. 40 capsule 0  . amoxicillin-clavulanate (AUGMENTIN) 875-125 MG tablet Take 1 tablet by mouth 2 (two) times daily for 10 days. 20 tablet 0  . cetirizine (ZYRTEC) 5 MG tablet Take 10 mg by mouth 2 (two) times daily.    Marland Kitchen EPINEPHrine 0.3 mg/0.3 mL IJ SOAJ injection Inject 0.3 mg into the muscle as needed.    . famotidine (PEPCID) 20 MG tablet Take 20 mg by mouth 2 (two) times daily.    Marland Kitchen HYDROcodone-acetaminophen (NORCO) 5-325 MG tablet Take 1 tablet by mouth every 6 (six) hours as needed for up to 6 doses for moderate pain. 6 tablet 0  . hydroxychloroquine (PLAQUENIL) 200 MG tablet Take 200 mg by mouth 2 (two) times daily.    Marland Kitchen ibuprofen  (ADVIL) 800 MG tablet Take 1 tablet (800 mg total) by mouth every 8 (eight) hours as needed for mild pain or moderate pain. 30 tablet 0  . levonorgestrel (MIRENA, 52 MG,) 20 MCG/24HR IUD 52 mg by Intrauterine route as directed.    Marland Kitchen losartan (COZAAR) 25 MG tablet TAKE 1 TABLET BY MOUTH EVERY DAY 90 tablet 3  . omalizumab (XOLAIR) 150 MG/ML prefilled syringe Inject 300 mg into the skin See admin instructions. INJECT 2 (150 MG) SYRINGES UNDER THE SKIN FOR A TOTAL DOSE OF 300MG  EVERY 4 WEEKS.    PROAIR HFA 108 (90 Base) MCG/ACT inhaler TAKE 2 PUFFS BY MOUTH EVERY 6 HOURS AS NEEDED 25.5 Inhaler 3  . promethazine (PHENERGAN) 25 MG tablet Take 1 tablet (25 mg total) by mouth every 8 (eight) hours as needed for nausea or vomiting. 20 tablet 1  . traZODone (DESYREL) 50 MG tablet TAKE ONE-HALF TO ONE TABLET BY MOUTH AT BEDTIME AS NEEDED FOR SLEEP 90 tablet 1   No current facility-administered medications for this visit.    Allergies: Prozac [fluoxetine hcl]  Past Medical History:  Diagnosis Date  . ALLERGIC RHINITIS 10/30/2009  . ANXIETY 09/04/2006  . ASTHMA 12/25/2006   inhaler used 2 days ago  . ASTHMA, WITH ACUTE EXACERBATION 05/03/2009  . BLEPHARITIS, LEFT 05/21/2008  . Cervical disc disease    2 bulging discs to neck   . COMMON MIGRAINE 06/14/2007  . GANGLION CYST, WRIST,  LEFT 10/11/2007  . GERD (gastroesophageal reflux disease) 04/24/2012  . Headache(784.0) 10/23/2008  . HYPERLIPIDEMIA 06/14/2007  . LOW BACK PAIN 09/04/2006  . Morbid obesity ([place]) 09/04/2006  . OTITIS MEDIA, ACUTE, LEFT 03/27/2008  . SINUSITIS- ACUTE-NOS 10/11/2007  . TENOSYNOVITIS, WRIST 10/11/2007  . URI 10/29/2009  . URTICARIA 05/03/2009    Past Surgical History:  Procedure Laterality Date  . back surgury  01/2003   s/p lumbar disc  . CESAREAN SECTION N/A 02/19/2013   Procedure: CESAREAN SECTION;  Surgeon: Betsy Coder, MD;  Location: [facility];  Service: Obstetrics;  Laterality: N/A;  . LAPAROSCOPIC APPENDECTOMY N/A  12/09/2018   Procedure: APPENDECTOMY LAPAROSCOPIC;  Surgeon: Benjamine Sprague, DO;  Location: [facility];  Service: General;  Laterality: N/A;  . LAPAROSCOPY  04/07/2011   Procedure: LAPAROSCOPY OPERATIVE;  Surgeon: Margarette Asal, MD;  Location: [facility];  Service: Gynecology;  Laterality: N/A;  left salpingogectomy    Family History  Problem Relation Age of Onset  . Anxiety disorder Father   . Diabetes Father     Social History   Tobacco Use  . Smoking status: Current Every Day Smoker    Packs/day: 0.50    Types: Cigarettes  . Smokeless tobacco: Never Used  Substance Use Topics  . Alcohol use: Yes    Alcohol/week: 0.0 standard drinks    Comment: rarely    Subjective:    I connected with Martha Thompson on 01/01/19 at  9:40 AM EST by a video enabled telemedicine application and verified that I am speaking with the correct person using two identifiers. Provider in office/ patient is at home; provider and patient are only 2 people on video call.     I discussed the limitations of evaluation and management by telemedicine and the availability of in person appointments. The patient expressed understanding and agreed to proceed.   Patient received + COVID test results last night; started feeling symptoms on Sunday but is concerned that she has a sinus infection;  + nasal congestion/ sinus pressure; + low grade fever; no chest pain or shortness of breath; does have asthma- is using her rescue inhaler;   Objective:  There were no vitals filed for this visit.  General: Well developed, well nourished, in no acute distress  Head: Normocephalic and atraumatic  Lungs: Respirations unlabored;  Neurologic: Alert and oriented; speech intact; face symmetrical;   Assessment:  1. COVID-19     Plan:  Symptomatic treatment discussed/ need to quarantine x 14 days; patient is given Rx for Augmentin to hold and fill only if symptoms worsening especially with upcoming holiday/ history of  asthma. Follow-up worse, no better.   No follow-ups on file.  No orders of the defined types were placed in this encounter.   Requested Prescriptions   Signed Prescriptions Disp Refills  . amoxicillin-clavulanate (AUGMENTIN) 875-125 MG tablet 20 tablet 0    Sig: Take 1 tablet by mouth 2 (two) times daily for 10 days.

## 2019-01-13 ENCOUNTER — Other Ambulatory Visit: Payer: Self-pay | Admitting: Internal Medicine

## 2019-01-20 ENCOUNTER — Ambulatory Visit (INDEPENDENT_AMBULATORY_CARE_PROVIDER_SITE_OTHER): Payer: 59 | Admitting: Family

## 2019-01-20 ENCOUNTER — Other Ambulatory Visit: Payer: Self-pay

## 2019-01-20 DIAGNOSIS — U071 COVID-19: Secondary | ICD-10-CM | POA: Diagnosis not present

## 2019-01-20 DIAGNOSIS — J209 Acute bronchitis, unspecified: Secondary | ICD-10-CM | POA: Diagnosis not present

## 2019-01-20 DIAGNOSIS — J029 Acute pharyngitis, unspecified: Secondary | ICD-10-CM | POA: Diagnosis not present

## 2019-01-20 MED ORDER — CEFDINIR 300 MG PO CAPS: 300.0000 mg | ORAL_CAPSULE | Freq: Two times a day (BID) | ORAL | 0 refills | Status: DC

## 2019-01-20 NOTE — Progress Notes (Signed)
Martha Thompson is a 34 y.o. female with the following history as recorded in EpicCare:  Patient Active Problem List   Diagnosis Date Noted  . Acute appendicitis 12/09/2018  . STD exposure 02/19/2017  . Cough 08/08/2016  . Chronic pain of left knee 08/08/2016  . Insomnia 02/08/2016  . Acute left lumbar radiculopathy 09/16/2015  . Hyperglycemia 08/13/2015  . GERD (gastroesophageal reflux disease) 04/24/2012  . Wheezing 04/17/2011  . Preventative health care 04/13/2010  . Allergic rhinitis 10/30/2009  . Acute upper respiratory infection 10/29/2009  . DISC DISEASE, CERVICAL 10/29/2009  . Hives 05/03/2009  . Essential hypertension 10/11/2007  . HYPERLIPIDEMIA 06/14/2007  . Asthma 12/25/2006  . Morbid obesity (HCC) 09/04/2006  . Generalized anxiety disorder 09/04/2006  . Anxiety with depression 09/04/2006  . LOW BACK PAIN 09/04/2006    Current Outpatient Medications  Medication Sig Dispense Refill  . ALPRAZolam (XANAX) 1 MG tablet TAKE 1 TABLET BY MOUTH THREE TIMES A DAY AS NEEDED FOR ANXIETY 90 tablet 2  . cefdinir (OMNICEF) 300 MG capsule Take 1 capsule (300 mg total) by mouth 2 (two) times daily. 20 capsule 0  . cetirizine (ZYRTEC) 5 MG tablet Take 10 mg by mouth 2 (two) times daily.    Marland Kitchen EPINEPHrine 0.3 mg/0.3 mL IJ SOAJ injection Inject 0.3 mg into the muscle as needed.    . famotidine (PEPCID) 20 MG tablet Take 20 mg by mouth 2 (two) times daily.    Marland Kitchen HYDROcodone-acetaminophen (NORCO) 5-325 MG tablet Take 1 tablet by mouth every 6 (six) hours as needed for up to 6 doses for moderate pain. 6 tablet 0  . hydroxychloroquine (PLAQUENIL) 200 MG tablet Take 200 mg by mouth 2 (two) times daily.    Marland Kitchen ibuprofen (ADVIL) 800 MG tablet Take 1 tablet (800 mg total) by mouth every 8 (eight) hours as needed for mild pain or moderate pain. 30 tablet 0  . levonorgestrel (MIRENA, 52 MG,) 20 MCG/24HR IUD 52 mg by Intrauterine route as directed.    Marland Kitchen losartan (COZAAR) 25 MG tablet TAKE 1  TABLET BY MOUTH EVERY DAY 90 tablet 3  . omalizumab (XOLAIR) 150 MG/ML prefilled syringe Inject 300 mg into the skin See admin instructions. INJECT 2 (150 MG) SYRINGES UNDER THE SKIN FOR A TOTAL DOSE OF 300MG  EVERY 4 WEEKS.    PROAIR HFA 108 (90 Base) MCG/ACT inhaler TAKE 2 PUFFS BY MOUTH EVERY 6 HOURS AS NEEDED 25.5 Inhaler 3  . promethazine (PHENERGAN) 25 MG tablet TAKE 1 TABLET (25 MG TOTAL) BY MOUTH EVERY 8 (EIGHT) HOURS AS NEEDED FOR NAUSEA OR VOMITING. 20 tablet 1  . traZODone (DESYREL) 50 MG tablet TAKE ONE-HALF TO ONE TABLET BY MOUTH AT BEDTIME AS NEEDED FOR SLEEP 90 tablet 1   No current facility-administered medications for this visit.    Allergies: Prozac [fluoxetine hcl]  Past Medical History:  Diagnosis Date  . ALLERGIC RHINITIS 10/30/2009  . ANXIETY 09/04/2006  . ASTHMA 12/25/2006   inhaler used 2 days ago  . ASTHMA, WITH ACUTE EXACERBATION 05/03/2009  . BLEPHARITIS, LEFT 05/21/2008  . Cervical disc disease    2 bulging discs to neck   . COMMON MIGRAINE 06/14/2007  . GANGLION CYST, WRIST, LEFT 10/11/2007  . GERD (gastroesophageal reflux disease) 04/24/2012  . Headache(784.0) 10/23/2008  . HYPERLIPIDEMIA 06/14/2007  . LOW BACK PAIN 09/04/2006  . Morbid obesity (HCC) 09/04/2006  . OTITIS MEDIA, ACUTE, LEFT 03/27/2008  . SINUSITIS- ACUTE-NOS 10/11/2007  . TENOSYNOVITIS, WRIST 10/11/2007  . URI  10/29/2009  . URTICARIA 05/03/2009    Past Surgical History:  Procedure Laterality Date  . back surgury  01/2003   s/p lumbar disc  . CESAREAN SECTION N/A 02/19/2013   Procedure: CESAREAN SECTION;  Surgeon: Betsy Coder, MD;  Location: [facility];  Service: Obstetrics;  Laterality: N/A;  . LAPAROSCOPIC APPENDECTOMY N/A 12/09/2018   Procedure: APPENDECTOMY LAPAROSCOPIC;  Surgeon: Benjamine Sprague, DO;  Location: [facility];  Service: General;  Laterality: N/A;  . LAPAROSCOPY  04/07/2011   Procedure: LAPAROSCOPY OPERATIVE;  Surgeon: Margarette Asal, MD;  Location: [facility];  Service: Gynecology;   Laterality: N/A;  left salpingogectomy    Family History  Problem Relation Age of Onset  . Anxiety disorder Father   . Diabetes Father     Social History   Tobacco Use  . Smoking status: Current Every Day Smoker    Packs/day: 0.50    Types: Cigarettes  . Smokeless tobacco: Never Used  Substance Use Topics  . Alcohol use: Yes    Alcohol/week: 0.0 standard drinks    Comment: rarely    Subjective:    I connected with Martha Thompson on 01/20/19 at  2:20 PM EST by a video enabled telemedicine application and verified that I am speaking with the correct person using two identifiers. Provider in office/ patient is at home; provider and patient are only 2 people on video call.     I discussed the limitations of evaluation and management by telemedicine and the availability of in person appointments. The patient expressed understanding and agreed to proceed.  Patient had positive COVID test on 12/23 but is continuing to have persisting symptoms of congestion, sore throat; has actually been re-tested for COVID in the past few days and this test was negative; her daughter also had COVID before Christmas and was actually found to have Strep throat today at pediatrician; patient is wondering if she could also have strep throat; also mentions that her chest feels congested/ has run consistent low grade fever of 99-100;     Objective:  There were no vitals filed for this visit.  General: Well developed, well nourished, in no acute distress  Head: Normocephalic and atraumatic  Lungs: Respirations unlabored;  Neurologic: Alert and oriented; speech intact; face symmetrical;   Assessment:  1. Sore throat   2. Acute bronchitis, unspecified organism   3. COVID-19     Plan:  Will treat with 10 day course of Omnicef; patient took Augmentin at end of December so would like to use a different antibiotic; she has had a repeat negative COVID test in the past few days; she is encouraged to get  a new toothbrush in 24 hours and again after finishing antibiotics; if symptoms persist, need to consider CXR and CBC.   No follow-ups on file.  No orders of the defined types were placed in this encounter.   Requested Prescriptions   Signed Prescriptions Disp Refills  . cefdinir (OMNICEF) 300 MG capsule 20 capsule 0    Sig: Take 1 capsule (300 mg total) by mouth 2 (two) times daily.

## 2019-01-22 ENCOUNTER — Ambulatory Visit: Payer: 59 | Admitting: Internal Medicine

## 2019-02-11 ENCOUNTER — Other Ambulatory Visit: Payer: Self-pay | Admitting: Internal Medicine

## 2019-02-12 NOTE — Telephone Encounter (Signed)
Done erx 

## 2019-03-07 ENCOUNTER — Other Ambulatory Visit: Payer: Self-pay

## 2019-03-07 ENCOUNTER — Encounter: Payer: Self-pay | Admitting: Internal Medicine

## 2019-03-07 ENCOUNTER — Ambulatory Visit (INDEPENDENT_AMBULATORY_CARE_PROVIDER_SITE_OTHER)
Admission: RE | Admit: 2019-03-07 | Discharge: 2019-03-07 | Disposition: A | Discharge status: Home / Self Care | Payer: 59 | Source: Ambulatory Visit | Attending: Internal Medicine | Admitting: Internal Medicine

## 2019-03-07 ENCOUNTER — Ambulatory Visit (INDEPENDENT_AMBULATORY_CARE_PROVIDER_SITE_OTHER): Payer: 59 | Admitting: Internal Medicine

## 2019-03-07 ENCOUNTER — Other Ambulatory Visit (INDEPENDENT_AMBULATORY_CARE_PROVIDER_SITE_OTHER): Payer: 59

## 2019-03-07 VITALS — BP 126/84 | HR 90 | Temp 98.1°F | Ht 71.0 in | Wt 329.4 lb

## 2019-03-07 DIAGNOSIS — J4531 Mild persistent asthma with (acute) exacerbation: Secondary | ICD-10-CM

## 2019-03-07 DIAGNOSIS — R739 Hyperglycemia, unspecified: Secondary | ICD-10-CM

## 2019-03-07 DIAGNOSIS — Z0001 Encounter for general adult medical examination with abnormal findings: Secondary | ICD-10-CM

## 2019-03-07 DIAGNOSIS — E538 Deficiency of other specified B group vitamins: Secondary | ICD-10-CM

## 2019-03-07 DIAGNOSIS — J45901 Unspecified asthma with (acute) exacerbation: Secondary | ICD-10-CM | POA: Insufficient documentation

## 2019-03-07 DIAGNOSIS — E559 Vitamin D deficiency, unspecified: Secondary | ICD-10-CM

## 2019-03-07 DIAGNOSIS — K219 Gastro-esophageal reflux disease without esophagitis: Secondary | ICD-10-CM

## 2019-03-07 DIAGNOSIS — I10 Essential (primary) hypertension: Secondary | ICD-10-CM | POA: Diagnosis not present

## 2019-03-07 LAB — URINALYSIS, ROUTINE W REFLEX MICROSCOPIC
Bilirubin Urine: NEGATIVE
Hgb urine dipstick: NEGATIVE
Ketones, ur: NEGATIVE
Leukocytes,Ua: NEGATIVE
Nitrite: NEGATIVE
RBC / HPF: NONE SEEN (ref 0–?)
Specific Gravity, Urine: 1.025 (ref 1.000–1.030)
Total Protein, Urine: NEGATIVE
Urine Glucose: NEGATIVE
Urobilinogen, UA: 0.2 (ref 0.0–1.0)
pH: 5.5 (ref 5.0–8.0)

## 2019-03-07 LAB — HEPATIC FUNCTION PANEL
ALT: 13 U/L (ref 0–35)
AST: 13 U/L (ref 0–37)
Albumin: 4.3 g/dL (ref 3.5–5.2)
Alkaline Phosphatase: 43 U/L (ref 39–117)
Bilirubin, Direct: 0.1 mg/dL (ref 0.0–0.3)
Total Bilirubin: 0.6 mg/dL (ref 0.2–1.2)
Total Protein: 7.7 g/dL (ref 6.0–8.3)

## 2019-03-07 LAB — TSH: TSH: 1.65 u[IU]/mL (ref 0.35–4.50)

## 2019-03-07 LAB — BASIC METABOLIC PANEL
BUN: 18 mg/dL (ref 6–23)
CO2: 25 mEq/L (ref 19–32)
Calcium: 9.7 mg/dL (ref 8.4–10.5)
Chloride: 106 mEq/L (ref 96–112)
Creatinine, Ser: 0.7 mg/dL (ref 0.40–1.20)
GFR: 96.53 mL/min (ref >60.00)
Glucose, Bld: 84 mg/dL (ref 70–99)
Potassium: 4 mEq/L (ref 3.5–5.1)
Sodium: 136 mEq/L (ref 135–145)

## 2019-03-07 LAB — CBC WITH DIFFERENTIAL/PLATELET
Basophils Absolute: 0.1 10*3/uL (ref 0.0–0.1)
Basophils Relative: 1.2 % (ref 0.0–3.0)
Eosinophils Absolute: 0.3 10*3/uL (ref 0.0–0.7)
Eosinophils Relative: 2.8 % (ref 0.0–5.0)
HCT: 44.1 % (ref 36.0–46.0)
Hemoglobin: 14.9 g/dL (ref 12.0–15.0)
Lymphocytes Relative: 34.5 % (ref 12.0–46.0)
Lymphs Abs: 3.4 10*3/uL (ref 0.7–4.0)
MCHC: 33.8 g/dL (ref 30.0–36.0)
MCV: 92.6 fl (ref 78.0–100.0)
Monocytes Absolute: 0.7 10*3/uL (ref 0.1–1.0)
Monocytes Relative: 7.5 % (ref 3.0–12.0)
Neutro Abs: 5.3 10*3/uL (ref 1.4–7.7)
Neutrophils Relative %: 54 % (ref 43.0–77.0)
Platelets: 208 10*3/uL (ref 150.0–400.0)
RBC: 4.77 Mil/uL (ref 3.87–5.11)
RDW: 12.8 % (ref 11.5–15.5)
WBC: 9.8 10*3/uL (ref 4.0–10.5)

## 2019-03-07 LAB — LIPID PANEL
Cholesterol: 155 mg/dL (ref 0–200)
HDL: 34.9 mg/dL — ABNORMAL LOW (ref >39.00)
LDL Cholesterol: 98 mg/dL (ref 0–99)
NonHDL: 120.45
Total CHOL/HDL Ratio: 4
Triglycerides: 110 mg/dL (ref 0.0–149.0)
VLDL: 22 mg/dL (ref 0.0–40.0)

## 2019-03-07 LAB — VITAMIN D 25 HYDROXY (VIT D DEFICIENCY, FRACTURES): VITD: 21.82 ng/mL — ABNORMAL LOW (ref 30.00–100.00)

## 2019-03-07 LAB — VITAMIN B12: Vitamin B-12: 260 pg/mL (ref 211–911)

## 2019-03-07 LAB — HEMOGLOBIN A1C: Hgb A1c MFr Bld: 5.1 % (ref 4.6–6.5)

## 2019-03-07 MED ORDER — PREDNISONE 10 MG PO TABS: ORAL_TABLET | ORAL | 0 refills | Status: DC

## 2019-03-07 MED ORDER — ALBUTEROL SULFATE HFA 108 (90 BASE) MCG/ACT IN AERS: INHALATION_SPRAY | RESPIRATORY_TRACT | 3 refills | Status: DC

## 2019-03-07 MED ORDER — FLUTICASONE-SALMETEROL 100-50 MCG/DOSE IN AEPB: 1.0000 | INHALATION_SPRAY | Freq: Two times a day (BID) | RESPIRATORY_TRACT | 3 refills | Status: AC

## 2019-03-07 MED ORDER — METHYLPREDNISOLONE ACETATE 80 MG/ML IJ SUSP
80.0000 mg | Freq: Once | INTRAMUSCULAR | Status: AC
  Administered 2019-03-07: 80 mg via INTRAMUSCULAR

## 2019-03-07 MED ORDER — PHENTERMINE HCL 37.5 MG PO CAPS: 37.5000 mg | ORAL_CAPSULE | ORAL | 2 refills | Status: DC

## 2019-03-07 NOTE — Progress Notes (Signed)
Subjective:    Patient ID: Martha Thompson, female    DOB: 05/31/1985, 34 y.o.   MRN: [id number]  HPI  Here for wellness and f/u;  Overall doing ok;  Pt denies Chest pain, orthopnea, PND, worsening LE edema, palpitations, dizziness or syncope.  Pt denies neurological change such as new headache, facial or extremity weakness.  Pt denies polydipsia, polyuria, or low sugar symptoms. Pt states overall good compliance with treatment and medications, good tolerability, and has been trying to follow appropriate diet.  Pt denies worsening depressive symptoms, suicidal ideation or panic. No fever, night sweats, wt loss, loss of appetite, or other constitutional symptoms.  Pt states good ability with ADL's, has low fall risk, home safety reviewed and adequate, no other significant changes in hearing or vision, and only occasionally active with exercise. S /p appy nov 30, then COVID + on dec 21, out of work for 1 mo, still with some fatigue and persistent sob, with wheezing some days with 15 puffs on inhaler  Has gained wt with illness and pandemic Wt Readings from Last 3 Encounters:  03/07/19 (!) 329 lb 6 oz (149.4 kg)  12/09/18 (!) 301 lb (136.5 kg)  02/22/18 (!) 314 lb (142.4 kg)  Denies worsening reflux, abd pain, dysphagia, n/v, bowel change or blood. Past Medical History:  Diagnosis Date  . ALLERGIC RHINITIS 10/30/2009  . ANXIETY 09/04/2006  . ASTHMA 12/25/2006   inhaler used 2 days ago  . ASTHMA, WITH ACUTE EXACERBATION 05/03/2009  . BLEPHARITIS, LEFT 05/21/2008  . Cervical disc disease    2 bulging discs to neck   . COMMON MIGRAINE 06/14/2007  . GANGLION CYST, WRIST, LEFT 10/11/2007  . GERD (gastroesophageal reflux disease) 04/24/2012  . Headache(784.0) 10/23/2008  . HYPERLIPIDEMIA 06/14/2007  . LOW BACK PAIN 09/04/2006  . Morbid obesity (HCC) 09/04/2006  . OTITIS MEDIA, ACUTE, LEFT 03/27/2008  . SINUSITIS- ACUTE-NOS 10/11/2007  . TENOSYNOVITIS, WRIST 10/11/2007  . URI 10/29/2009  . URTICARIA  05/03/2009   Past Surgical History:  Procedure Laterality Date  . back surgury  01/2003   s/p lumbar disc  . CESAREAN SECTION N/A 02/19/2013   Procedure: CESAREAN SECTION;  Surgeon: Michael Litter, MD;  Location: [facility];  Service: Obstetrics;  Laterality: N/A;  . LAPAROSCOPIC APPENDECTOMY N/A 12/09/2018   Procedure: APPENDECTOMY LAPAROSCOPIC;  Surgeon: Sung Amabile, DO;  Location: [facility];  Service: General;  Laterality: N/A;  . LAPAROSCOPY  04/07/2011   Procedure: LAPAROSCOPY OPERATIVE;  Surgeon: Meriel Pica, MD;  Location: [facility];  Service: Gynecology;  Laterality: N/A;  left salpingogectomy    reports that she has been smoking cigarettes. She has been smoking about 0.50 packs per day. She has never used smokeless tobacco. She reports current alcohol use. She reports that she does not use drugs. family history includes Anxiety disorder in her father; Diabetes in her father. Allergies  Allergen Reactions  . Prozac [Fluoxetine Hcl] Other (See Comments)   Current Outpatient Medications on File Prior to Visit  Medication Sig Dispense Refill  . ALPRAZolam (XANAX) 1 MG tablet TAKE 1 TABLET BY MOUTH THREE TIMES A DAY AS NEEDED FOR ANXIETY 90 tablet 2  . cetirizine (ZYRTEC) 5 MG tablet Take 10 mg by mouth 2 (two) times daily.    Marland Kitchen EPINEPHrine 0.3 mg/0.3 mL IJ SOAJ injection Inject 0.3 mg into the muscle as needed.    . famotidine (PEPCID) 20 MG tablet Take 20 mg by mouth 2 (two) times daily.    Marland Kitchen  HYDROcodone-acetaminophen (NORCO) 5-325 MG tablet Take 1 tablet by mouth every 6 (six) hours as needed for up to 6 doses for moderate pain. 6 tablet 0  . hydroxychloroquine (PLAQUENIL) 200 MG tablet Take 200 mg by mouth 2 (two) times daily.    . hydrOXYzine (ATARAX/VISTARIL) 50 MG tablet hydroxyzine HCl 50 mg tablet    . ibuprofen (ADVIL) 800 MG tablet Take 1 tablet (800 mg total) by mouth every 8 (eight) hours as needed for mild pain or moderate pain. 30 tablet 0  . influenza vac recom  quadrivalent (FLUBLOK QUADRIVALENT) 0.5 ML injection Flublok Quad 2020-2021 (PF) 180 mcg (45 mcg x 4)/0.5 mL IM syringe  PHARMACIST ADMINISTERED IMMUNIZATION ADMINISTERED AT TIME OF DISPENSING    . levonorgestrel (MIRENA, 52 MG,) 20 MCG/24HR IUD 52 mg by Intrauterine route as directed.    Marland Kitchen losartan (COZAAR) 25 MG tablet TAKE 1 TABLET BY MOUTH EVERY DAY 90 tablet 3  . omalizumab (XOLAIR) 150 MG/ML prefilled syringe Inject 300 mg into the skin See admin instructions. INJECT 2 (150 MG) SYRINGES UNDER THE SKIN FOR A TOTAL DOSE OF 300MG  EVERY 4 WEEKS.    . promethazine (PHENERGAN) 25 MG tablet TAKE 1 TABLET (25 MG TOTAL) BY MOUTH EVERY 8 (EIGHT) HOURS AS NEEDED FOR NAUSEA OR VOMITING. 20 tablet 1  . traZODone (DESYREL) 50 MG tablet TAKE ONE-HALF TO ONE TABLET BY MOUTH AT BEDTIME AS NEEDED FOR SLEEP 90 tablet 1   No current facility-administered medications on file prior to visit.   Review of Systems All otherwise neg per pt     Objective:   Physical Exam BP 126/84 (BP Location: Right Arm, Patient Position: Sitting, Cuff Size: Large)   Pulse 90   Temp 98.1 F (36.7 C) (Oral)   Ht 5\' 11"  (1.803 m)   Wt (!) 329 lb 6 oz (149.4 kg)   SpO2 95%   BMI 45.94 kg/m  VS noted,  Constitutional: Pt appears in NAD HENT: Head: NCAT.  Right Ear: External ear normal.  Left Ear: External ear normal.  Eyes: . Pupils are equal, round, and reactive to light. Conjunctivae and EOM are normal Nose: without d/c or deformity Neck: Neck supple. Gross normal ROM Cardiovascular: Normal rate and regular rhythm.   Pulmonary/Chest: Effort normal and breath sounds decreased without rales but with bilat mild wheezing.  Abd:  Soft, NT, ND, + BS, no organomegaly Neurological: Pt is alert. At baseline orientation, motor grossly intact Skin: Skin is warm. No rashes, other new lesions, no LE edema Psychiatric: Pt behavior is normal without agitation  All otherwise neg per pt  Lab Results  Component Value Date   WBC  9.8 03/07/2019   HGB 14.9 03/07/2019   HCT 44.1 03/07/2019   PLT 208.0 03/07/2019   GLUCOSE 84 03/07/2019   CHOL 155 03/07/2019   TRIG 110.0 03/07/2019   HDL 34.90 (L) 03/07/2019   LDLDIRECT 113.0 02/22/2018   LDLCALC 98 03/07/2019   ALT 13 03/07/2019   AST 13 03/07/2019   NA 136 03/07/2019   K 4.0 03/07/2019   CL 106 03/07/2019   CREATININE 0.70 03/07/2019   BUN 18 03/07/2019   CO2 25 03/07/2019   TSH 1.65 03/07/2019   HGBA1C 5.1 03/07/2019      Assessment & Plan:

## 2019-03-07 NOTE — Patient Instructions (Signed)
You had the steroid shot today  Please take all new medication as prescribed - the prednisone, generic advair, and phentermine  Please continue all other medications as before, and refills have been done if requested - the albuterol inhaler  Please have the pharmacy call with any other refills you may need.  Please continue your efforts at being more active, low cholesterol diet, and weight control.  You are otherwise up to date with prevention measures today.  Please keep your appointments with your specialists as you may have planned  Please go to the XRAY Department in the first floor for the x-ray testing  Please go to the LAB at the blood drawing area for the tests to be done at the [facility] lab  You will be contacted by phone if any changes need to be made immediately.  Otherwise, you will receive a letter about your results with an explanation, but please check with MyChart first.  Please remember to sign up for MyChart if you have not done so, as this will be important to you in the future with finding out test results, communicating by private email, and scheduling acute appointments online when needed.  Please make an Appointment to return for your 1 year visit, or sooner if needed

## 2019-03-08 ENCOUNTER — Other Ambulatory Visit: Payer: Self-pay | Admitting: Internal Medicine

## 2019-03-08 MED ORDER — VITAMIN D (ERGOCALCIFEROL) 1.25 MG (50000 UNIT) PO CAPS: 50000.0000 [IU] | ORAL_CAPSULE | ORAL | 0 refills | Status: DC

## 2019-03-09 ENCOUNTER — Encounter: Payer: Self-pay | Admitting: Internal Medicine

## 2019-03-09 ENCOUNTER — Other Ambulatory Visit: Payer: Self-pay | Admitting: Internal Medicine

## 2019-03-09 NOTE — Assessment & Plan Note (Addendum)
Mild to mod persistent, for depomedrol IM 80 , predpac asd, and add advair asd to albuterol prn,  to f/u any worsening symptoms or concerns  I spent 31 minutes in preparing to see the patient by review of recent labs, imaging and procedures, obtaining and reviewing separately obtained history, communicating with the patient and family or caregiver, ordering medications, tests or procedures, and documenting clinical information in the EHR including the differential Dx, treatment, and any further evaluation and other management of asthma exacerbation, obesity, hyperglycemia, GERD, HTN

## 2019-03-09 NOTE — Assessment & Plan Note (Signed)

## 2019-03-09 NOTE — Assessment & Plan Note (Signed)
stable overall by history and exam, recent data reviewed with pt, and pt to continue medical treatment as before,  to f/u any worsening symptoms or concerns  

## 2019-03-09 NOTE — Assessment & Plan Note (Signed)
For phentermine asd, to f/u any worsening symptoms or concerns 

## 2019-03-09 NOTE — Telephone Encounter (Signed)
Please refill as per office routine med refill policy (all routine meds refilled for 3 mo or monthly per pt preference up to one year from last visit, then month to month grace period for 3 mo, then further med refills will have to be denied)  

## 2019-03-11 ENCOUNTER — Telehealth: Payer: Self-pay

## 2019-03-11 NOTE — Telephone Encounter (Signed)
Patient said that she is still not feeling any better. Coughing a lot,  left ear hurts, lots of mucus in sinus that feels like it will not come out.   Please advise

## 2019-03-11 NOTE — Telephone Encounter (Signed)
Ok for otc mucinex to help break up mucous, as does not appear I have any else to offer for this problem

## 2019-03-12 NOTE — Telephone Encounter (Signed)
Pt contacted and informed of same.  

## 2019-04-09 ENCOUNTER — Other Ambulatory Visit: Payer: Self-pay | Admitting: Internal Medicine

## 2019-04-26 ENCOUNTER — Other Ambulatory Visit: Payer: Self-pay | Admitting: Internal Medicine

## 2019-04-26 NOTE — Telephone Encounter (Signed)
Please refill as per office routine med refill policy (all routine meds refilled for 3 mo or monthly per pt preference up to one year from last visit, then month to month grace period for 3 mo, then further med refills will have to be denied)  

## 2019-05-07 ENCOUNTER — Ambulatory Visit (INDEPENDENT_AMBULATORY_CARE_PROVIDER_SITE_OTHER): Payer: 59

## 2019-05-07 ENCOUNTER — Ambulatory Visit: Payer: 59 | Admitting: Podiatry

## 2019-05-07 ENCOUNTER — Other Ambulatory Visit: Payer: Self-pay

## 2019-05-07 ENCOUNTER — Encounter: Payer: Self-pay | Admitting: Podiatry

## 2019-05-07 VITALS — Temp 97.7°F

## 2019-05-07 DIAGNOSIS — M722 Plantar fascial fibromatosis: Secondary | ICD-10-CM

## 2019-05-07 MED ORDER — MELOXICAM 15 MG PO TABS: 15.0000 mg | ORAL_TABLET | Freq: Every day | ORAL | 3 refills | Status: DC

## 2019-05-07 MED ORDER — METHYLPREDNISOLONE 4 MG PO TBPK: ORAL_TABLET | ORAL | 0 refills | Status: DC

## 2019-05-07 NOTE — Patient Instructions (Signed)

## 2019-05-07 NOTE — Progress Notes (Signed)
Subjective:  Patient ID: Martha Thompson, female    DOB: 1985-04-23,  MRN: [id number] HPI Chief Complaint  Patient presents with  . Foot Pain    Patient presents today for left heel pain x 8 months.  She states "its really sharp pains and sometimes feels like a bruise.  It's really bad in the mornings and by the end of the day and even hurts on the top  of my foot sometimes"  She has only tried otc inserts for relief    34 y.o. female presents with the above complaint.   ROS: Denies fever chills nausea vomiting muscle aches pains calf pain back pain chest pain shortness of breath.  Past Medical History:  Diagnosis Date  . ALLERGIC RHINITIS 10/30/2009  . ANXIETY 09/04/2006  . ASTHMA 12/25/2006   inhaler used 2 days ago  . ASTHMA, WITH ACUTE EXACERBATION 05/03/2009  . BLEPHARITIS, LEFT 05/21/2008  . Cervical disc disease    2 bulging discs to neck   . COMMON MIGRAINE 06/14/2007  . GANGLION CYST, WRIST, LEFT 10/11/2007  . GERD (gastroesophageal reflux disease) 04/24/2012  . Headache(784.0) 10/23/2008  . HYPERLIPIDEMIA 06/14/2007  . LOW BACK PAIN 09/04/2006  . Morbid obesity (HCC) 09/04/2006  . OTITIS MEDIA, ACUTE, LEFT 03/27/2008  . SINUSITIS- ACUTE-NOS 10/11/2007  . TENOSYNOVITIS, WRIST 10/11/2007  . URI 10/29/2009  . URTICARIA 05/03/2009   Past Surgical History:  Procedure Laterality Date  . back surgury  01/2003   s/p lumbar disc  . CESAREAN SECTION N/A 02/19/2013   Procedure: CESAREAN SECTION;  Surgeon: Michael Litter, MD;  Location: [facility];  Service: Obstetrics;  Laterality: N/A;  . LAPAROSCOPIC APPENDECTOMY N/A 12/09/2018   Procedure: APPENDECTOMY LAPAROSCOPIC;  Surgeon: Sung Amabile, DO;  Location: [facility];  Service: General;  Laterality: N/A;  . LAPAROSCOPY  04/07/2011   Procedure: LAPAROSCOPY OPERATIVE;  Surgeon: Meriel Pica, MD;  Location: [facility];  Service: Gynecology;  Laterality: N/A;  left salpingogectomy    Current Outpatient Medications:  .  albuterol  (VENTOLIN HFA) 108 (90 Base) MCG/ACT inhaler, INHALE 2 PUFFS INTO THE LUNGS EVERY 6 HOURS AS NEEDED, Disp: 25.5 g, Rfl: 0 .  ALPRAZolam (XANAX) 1 MG tablet, TAKE 1 TABLET BY MOUTH THREE TIMES A DAY AS NEEDED FOR ANXIETY, Disp: 90 tablet, Rfl: 2 .  cetirizine (ZYRTEC) 5 MG tablet, Take 10 mg by mouth 2 (two) times daily., Disp: , Rfl:  .  EPINEPHrine 0.3 mg/0.3 mL IJ SOAJ injection, Inject 0.3 mg into the muscle as needed., Disp: , Rfl:  .  famotidine (PEPCID) 20 MG tablet, Take 20 mg by mouth 2 (two) times daily., Disp: , Rfl:  .  Fluticasone-Salmeterol (ADVAIR) 100-50 MCG/DOSE AEPB, Inhale 1 puff into the lungs 2 (two) times daily., Disp: 3 each, Rfl: 3 .  hydroxychloroquine (PLAQUENIL) 200 MG tablet, Take 200 mg by mouth 2 (two) times daily., Disp: , Rfl:  .  hydrOXYzine (ATARAX/VISTARIL) 50 MG tablet, hydroxyzine HCl 50 mg tablet, Disp: , Rfl:  .  influenza vac recom quadrivalent (FLUBLOK QUADRIVALENT) 0.5 ML injection, Flublok Quad 2020-2021 (PF) 180 mcg (45 mcg x 4)/0.5 mL IM syringe  PHARMACIST ADMINISTERED IMMUNIZATION ADMINISTERED AT TIME OF DISPENSING, Disp: , Rfl:  .  levonorgestrel (MIRENA, 52 MG,) 20 MCG/24HR IUD, 52 mg by Intrauterine route as directed., Disp: , Rfl:  .  losartan (COZAAR) 25 MG tablet, TAKE 1 TABLET BY MOUTH EVERY DAY, Disp: 90 tablet, Rfl: 3 .  meloxicam (MOBIC) 15 MG tablet, Take 1  tablet (15 mg total) by mouth daily., Disp: 30 tablet, Rfl: 3 .  methylPREDNISolone (MEDROL DOSEPAK) 4 MG TBPK tablet, 6 day dose pack - take as directed, Disp: 21 tablet, Rfl: 0 .  omalizumab (XOLAIR) 150 MG/ML prefilled syringe, Inject 300 mg into the skin See admin instructions. INJECT 2 (150 MG) SYRINGES UNDER THE SKIN FOR A TOTAL DOSE OF 300MG  EVERY 4 WEEKS., Disp: , Rfl:  .  phentermine 37.5 MG capsule, Take 1 capsule (37.5 mg total) by mouth every morning., Disp: 30 capsule, Rfl: 2 .  promethazine (PHENERGAN) 25 MG tablet, TAKE 1 TABLET (25 MG TOTAL) BY MOUTH EVERY 8 (EIGHT) HOURS AS  NEEDED FOR NAUSEA OR VOMITING., Disp: 20 tablet, Rfl: 1 .  traZODone (DESYREL) 50 MG tablet, TAKE ONE-HALF TO ONE TABLET BY MOUTH AT BEDTIME AS NEEDED FOR SLEEP, Disp: 90 tablet, Rfl: 1 .  Vitamin D, Ergocalciferol, (DRISDOL) 1.25 MG (50000 UNIT) CAPS capsule, Take 1 capsule (50,000 Units total) by mouth every 7 (seven) days., Disp: 12 capsule, Rfl: 0  Allergies  Allergen Reactions  . Prozac [Fluoxetine Hcl] Other (See Comments)   Review of Systems Objective:   Vitals:   05/07/19 0831  Temp: 97.7 F (36.5 C)    General: Well developed, nourished, in no acute distress, alert and oriented x3   Dermatological: Skin is warm, dry and supple bilateral. Nails x 10 are well maintained; remaining integument appears unremarkable at this time. There are no open sores, no preulcerative lesions, no rash or signs of infection present.  Vascular: Dorsalis Pedis artery and Posterior Tibial artery pedal pulses are 2/4 bilateral with immedate capillary fill time. Pedal hair growth present. No varicosities and no lower extremity edema present bilateral.   Neruologic: Grossly intact via light touch bilateral. Vibratory intact via tuning fork bilateral. Protective threshold with Semmes Wienstein monofilament intact to all pedal sites bilateral. Patellar and Achilles deep tendon reflexes 2+ bilateral. No Babinski or clonus noted bilateral.   Musculoskeletal: No gross boney pedal deformities bilateral. No pain, crepitus, or limitation noted with foot and ankle range of motion bilateral. Muscular strength 5/5 in all groups tested bilateral.  She has pain on palpation medial calcaneal tubercle of her heel.  No pain on medial and lateral compression of the calcaneus.  Gait: Unassisted, Nonantalgic.    Radiographs:  Radiographs taken today demonstrate a rectus foot type with a soft tissue increase in density plantar calcaneal insertion site.  Minimal spurring noted no significant acute  abnormalities.   Assessment & Plan:   Assessment: Plantar fasciitis left foot  Plan: Discussed etiology pathology conservative surgical therapies at this point I feel I will go ahead and inject her 20 mg Kenalog 5 mg of Marcaine to the point of maximal tenderness of the left heel.  Put her in a plantar fascial brace and a night splint discussed appropriate shoe gear stretching exercise ice therapy sugar modifications start her Medrol Dosepak to be followed by meloxicam.  With any questions or concerns she will notify us immediately     Zakery Normington T. [place], [place]

## 2019-05-12 ENCOUNTER — Other Ambulatory Visit: Payer: Self-pay | Admitting: Internal Medicine

## 2019-05-13 NOTE — Telephone Encounter (Signed)
Xanax done erx  Ok to let pt know - Please change to OTC Vitamin D3 at 2000 units per day, indefinitely.

## 2019-05-14 ENCOUNTER — Ambulatory Visit
Admission: EM | Admit: 2019-05-14 | Discharge: 2019-05-14 | Disposition: A | Discharge status: Home / Self Care | Payer: 59 | Attending: Family Medicine | Admitting: Family Medicine

## 2019-05-14 ENCOUNTER — Encounter: Payer: Self-pay | Admitting: Emergency Medicine

## 2019-05-14 ENCOUNTER — Other Ambulatory Visit: Payer: Self-pay

## 2019-05-14 DIAGNOSIS — J209 Acute bronchitis, unspecified: Secondary | ICD-10-CM | POA: Insufficient documentation

## 2019-05-14 DIAGNOSIS — R3915 Urgency of urination: Secondary | ICD-10-CM | POA: Diagnosis present

## 2019-05-14 DIAGNOSIS — R300 Dysuria: Secondary | ICD-10-CM | POA: Insufficient documentation

## 2019-05-14 DIAGNOSIS — R350 Frequency of micturition: Secondary | ICD-10-CM | POA: Insufficient documentation

## 2019-05-14 LAB — POCT URINALYSIS DIP (MANUAL ENTRY)
Bilirubin, UA: NEGATIVE
Glucose, UA: NEGATIVE mg/dL
Ketones, POC UA: NEGATIVE mg/dL
Nitrite, UA: POSITIVE — AB
Protein Ur, POC: 100 mg/dL — AB
Spec Grav, UA: 1.025 (ref 1.010–1.025)
Urobilinogen, UA: 0.2 E.U./dL
pH, UA: 7 (ref 5.0–8.0)

## 2019-05-14 MED ORDER — NITROFURANTOIN MONOHYD MACRO 100 MG PO CAPS
100.0000 mg | ORAL_CAPSULE | Freq: Two times a day (BID) | ORAL | 0 refills | Status: DC
Start: 2019-05-14 — End: 2019-11-28

## 2019-05-14 MED ORDER — DEXAMETHASONE SODIUM PHOSPHATE 10 MG/ML IJ SOLN
10.0000 mg | Freq: Once | INTRAMUSCULAR | Status: AC
  Administered 2019-05-14: 17:00:00 10 mg via INTRAMUSCULAR

## 2019-05-14 MED ORDER — PHENAZOPYRIDINE HCL 200 MG PO TABS
200.0000 mg | ORAL_TABLET | Freq: Three times a day (TID) | ORAL | 0 refills | Status: DC
Start: 2019-05-14 — End: 2019-11-28

## 2019-05-14 NOTE — Discharge Instructions (Signed)
You have a urinary tract infection. We are going to culture your urine and will let you know if there needs to be a medication change.  Drink plenty of water, 8-10 glasses per day.   Take Macrobid 100mg  BID x 5 days  Take pyridium three times per day as needed for dysuria.  Follow up with your primary care provider as needed.   Go to the Emergency Department if you experience severe pain, shortness of breath, high fever, or other concerns.

## 2019-05-14 NOTE — ED Triage Notes (Signed)
Pt c/o dysuria, urinary frequency and pelvic pain. Started yesterday. She also states she has been coughing and short of breath. Started back in December when she had covid.

## 2019-05-14 NOTE — ED Provider Notes (Signed)
[facility]   CC:   SUBJECTIVE:  Martha Thompson is a 34 y.o. female who complains of urinary frequency, urgency and dysuria for the past day.  Patient denies a precipitating event, recent sexual encounter, excessive caffeine intake.  Localizes the pain to the lower abdomen.  Pain is intermittent and describes it as burning.  Has tried OTC medications without relief.  Symptoms are made worse with urination.  Admits to similar symptoms in the past.  Denies fever, chills, nausea, vomiting, abdominal pain, flank pain, abnormal vaginal discharge or bleeding, hematuria.    Reports that she had Covid x 6 months ago. Reports SOB, cough at night, having to use albuterol and advair inhalers multiple times per day. Does have an allergist and has not seen them since she had Covid. On multiple allergy medications per their regimen. Just finished a steroid taper for plantar fasciitis, states that it did not help her breathing.  LMP: No LMP recorded. (Menstrual status: IUD).  ROS: As in HPI.  All other pertinent ROS negative.     Past Medical History:  Diagnosis Date  . ALLERGIC RHINITIS 10/30/2009  . ANXIETY 09/04/2006  . ASTHMA 12/25/2006   inhaler used 2 days ago  . ASTHMA, WITH ACUTE EXACERBATION 05/03/2009  . BLEPHARITIS, LEFT 05/21/2008  . Cervical disc disease    2 bulging discs to neck   . COMMON MIGRAINE 06/14/2007  . GANGLION CYST, WRIST, LEFT 10/11/2007  . GERD (gastroesophageal reflux disease) 04/24/2012  . Headache(784.0) 10/23/2008  . HYPERLIPIDEMIA 06/14/2007  . LOW BACK PAIN 09/04/2006  . Morbid obesity (HCC) 09/04/2006  . OTITIS MEDIA, ACUTE, LEFT 03/27/2008  . SINUSITIS- ACUTE-NOS 10/11/2007  . TENOSYNOVITIS, WRIST 10/11/2007  . URI 10/29/2009  . URTICARIA 05/03/2009   Past Surgical History:  Procedure Laterality Date  . back surgury  01/2003   s/p lumbar disc  . CESAREAN SECTION N/A 02/19/2013   Procedure: CESAREAN SECTION;  Surgeon: Michael Litter, MD;  Location:  [facility];  Service: Obstetrics;  Laterality: N/A;  . LAPAROSCOPIC APPENDECTOMY N/A 12/09/2018   Procedure: APPENDECTOMY LAPAROSCOPIC;  Surgeon: Sung Amabile, DO;  Location: [facility];  Service: General;  Laterality: N/A;  . LAPAROSCOPY  04/07/2011   Procedure: LAPAROSCOPY OPERATIVE;  Surgeon: Meriel Pica, MD;  Location: [facility];  Service: Gynecology;  Laterality: N/A;  left salpingogectomy   Allergies  Allergen Reactions  . Prozac [Fluoxetine Hcl] Other (See Comments)   No current facility-administered medications on file prior to encounter.   Current Outpatient Medications on File Prior to Encounter  Medication Sig Dispense Refill  . albuterol (VENTOLIN HFA) 108 (90 Base) MCG/ACT inhaler INHALE 2 PUFFS INTO THE LUNGS EVERY 6 HOURS AS NEEDED 25.5 g 0  . ALPRAZolam (XANAX) 1 MG tablet TAKE 1 TABLET BY MOUTH THREE TIMES A DAY AS NEEDED FOR ANXIETY 90 tablet 2  . EPINEPHrine 0.3 mg/0.3 mL IJ SOAJ injection Inject 0.3 mg into the muscle as needed.    . Fluticasone-Salmeterol (ADVAIR) 100-50 MCG/DOSE AEPB Inhale 1 puff into the lungs 2 (two) times daily. 3 each 3  . hydroxychloroquine (PLAQUENIL) 200 MG tablet Take 200 mg by mouth 2 (two) times daily.    . hydrOXYzine (ATARAX/VISTARIL) 50 MG tablet hydroxyzine HCl 50 mg tablet    . levonorgestrel (MIRENA, 52 MG,) 20 MCG/24HR IUD 52 mg by Intrauterine route as directed.    Marland Kitchen losartan (COZAAR) 25 MG tablet TAKE 1 TABLET BY MOUTH EVERY DAY 90 tablet 3  . meloxicam (MOBIC)  15 MG tablet Take 1 tablet (15 mg total) by mouth daily. 30 tablet 3  . omalizumab Arvid Right) 150 MG/ML prefilled syringe Inject 300 mg into the skin See admin instructions. INJECT 2 (150 MG) SYRINGES UNDER THE SKIN FOR A TOTAL DOSE OF 300MG  EVERY 4 WEEKS.    . phentermine 37.5 MG capsule Take 1 capsule (37.5 mg total) by mouth every morning. 30 capsule 2  . promethazine (PHENERGAN) 25 MG tablet TAKE 1 TABLET (25 MG TOTAL) BY MOUTH EVERY 8 (EIGHT) HOURS AS NEEDED FOR NAUSEA OR  VOMITING. 20 tablet 1  . traZODone (DESYREL) 50 MG tablet TAKE ONE-HALF TO ONE TABLET BY MOUTH AT BEDTIME AS NEEDED FOR SLEEP 90 tablet 1  . Vitamin D, Ergocalciferol, (DRISDOL) 1.25 MG (50000 UNIT) CAPS capsule Take 1 capsule (50,000 Units total) by mouth every 7 (seven) days. 12 capsule 0  . cetirizine (ZYRTEC) 5 MG tablet Take 10 mg by mouth 2 (two) times daily.    . famotidine (PEPCID) 20 MG tablet Take 20 mg by mouth 2 (two) times daily.    . influenza vac recom quadrivalent (FLUBLOK QUADRIVALENT) 0.5 ML injection Flublok Quad 2020-2021 (PF) 180 mcg (45 mcg x 4)/0.5 mL IM syringe  PHARMACIST ADMINISTERED IMMUNIZATION ADMINISTERED AT TIME OF DISPENSING    . methylPREDNISolone (MEDROL DOSEPAK) 4 MG TBPK tablet 6 day dose pack - take as directed 21 tablet 0   Social History   Socioeconomic History  . Marital status: Married    Spouse name: Not on file  . Number of children: Not on file  . Years of education: Not on file  . Highest education level: Not on file  Occupational History    Employer: [employer]    Comment: 12 hour days  Tobacco Use  . Smoking status: Current Every Day Smoker    Packs/day: 0.50    Types: Cigarettes  . Smokeless tobacco: Never Used  Substance and Sexual Activity  . Alcohol use: Yes    Alcohol/week: 0.0 standard drinks    Comment: rarely  . Drug use: No  . Sexual activity: Not on file  Other Topics Concern  . Not on file  Social History Narrative   Married.   2 children.   Works for [employer].   Enjoys spending time outdoors.   Social Determinants of Health   Financial Resource Strain:   . Difficulty of Paying Living Expenses:   Food Insecurity:   . Worried About Charity fundraiser in the Last Year:   . Arboriculturist in the Last Year:   Transportation Needs:   . Film/video editor (Medical):   Marland Kitchen Lack of Transportation (Non-Medical):   Physical Activity:   . Days of Exercise per Week:   . Minutes of Exercise per Session:   Stress:   .  Feeling of Stress :   Social Connections:   . Frequency of Communication with Friends and Family:   . Frequency of Social Gatherings with Friends and Family:   . Attends Religious Services:   . Active Member of Clubs or Organizations:   . Attends Archivist Meetings:   Marland Kitchen Marital Status:   Intimate Partner Violence:   . Fear of Current or Ex-Partner:   . Emotionally Abused:   Marland Kitchen Physically Abused:   . Sexually Abused:    Family History  Problem Relation Age of Onset  . Anxiety disorder Father   . Diabetes Father     OBJECTIVE:  Vitals:   05/14/19  1640 05/14/19 1641  BP: 131/70   Pulse: 87   Resp: 18   Temp: 97.8 F (36.6 C)   TempSrc: Oral   SpO2: 95%   Weight:  (!) 329 lb 5.9 oz (149.4 kg)  Height:  5\' 11"  (1.803 m)   General appearance: AOx3 in no acute distress HEENT: NCAT.  Oropharynx clear.  Lungs: clear to auscultation bilaterally without adventitious breath sounds Heart: regular rate and rhythm.  Radial pulses 2+ symmetrical bilaterally Abdomen: soft; non-distended; suprapubic tenderness noted; bowel sounds present; no guarding or rebound tenderness Back: no CVA tenderness Extremities: no edema; symmetrical with no gross deformities Skin: warm and dry Pulm: Diffuse wheezing throughout bilat lung fields Neurologic: Ambulates from chair to exam table without difficulty Psychological: alert and cooperative; normal mood and affect  Labs Reviewed  POCT URINALYSIS DIP (MANUAL ENTRY) - Abnormal; Notable for the following components:      Result Value   Color, UA light yellow (*)    Blood, UA small (*)    Protein Ur, POC =100 (*)    Nitrite, UA Positive (*)    Leukocytes, UA Small (1+) (*)    All other components within normal limits  URINE CULTURE    ASSESSMENT & PLAN:  1. Dysuria   2. Urinary frequency   3. Urgency of urination   4. Acute bronchitis, unspecified organism     Meds ordered this encounter  Medications  . nitrofurantoin,  macrocrystal-monohydrate, (MACROBID) 100 MG capsule    Sig: Take 1 capsule (100 mg total) by mouth 2 (two) times daily.    Dispense:  10 capsule    Refill:  0    Order Specific Question:   Supervising Provider    Answer:   Merrilee Jansky  . phenazopyridine (PYRIDIUM) 200 MG tablet    Sig: Take 1 tablet (200 mg total) by mouth 3 (three) times daily.    Dispense:  6 tablet    Refill:  0    Order Specific Question:   Supervising Provider    Answer:   [id number] Merrilee Jansky  . dexamethasone (DECADRON) injection 10 mg   Bronchitis Continue with current medication regimen per Allergist. Decadron 10mg  IM in office today. Follow up with allergy and asthma specialists if this continues.  Urine culture sent.  We will call you with the results.   Push fluids and get plenty of rest.   Take antibiotic as directed and to completion Take pyridium as prescribed and as needed for symptomatic relief Follow up with PCP if symptoms persists Return here or go to ER if you have any new or worsening symptoms such as fever, worsening abdominal pain, nausea/vomiting, flank pain, etc...  Outlined signs and symptoms indicating need for more acute intervention. Patient verbalized understanding. After Visit Summary given.      [id number], NP 05/14/19 1717

## 2019-05-16 ENCOUNTER — Telehealth: Payer: Self-pay

## 2019-05-16 DIAGNOSIS — B379 Candidiasis, unspecified: Secondary | ICD-10-CM

## 2019-05-16 MED ORDER — FLUCONAZOLE 150 MG PO TABS: ORAL_TABLET | ORAL | 0 refills | Status: DC

## 2019-05-16 NOTE — Telephone Encounter (Signed)
Pt started abx given at [facility].  Reports s/s starting for yeast infection including vaginal itching and irritation. Diflucan sent in to pt's pharmacy of choice.

## 2019-05-17 LAB — URINE CULTURE: Culture: >=100000 — AB

## 2019-06-04 ENCOUNTER — Other Ambulatory Visit: Payer: Self-pay

## 2019-06-04 ENCOUNTER — Encounter: Payer: Self-pay | Admitting: Podiatry

## 2019-06-04 ENCOUNTER — Ambulatory Visit: Payer: 59 | Admitting: Podiatry

## 2019-06-04 DIAGNOSIS — M722 Plantar fascial fibromatosis: Secondary | ICD-10-CM

## 2019-06-04 NOTE — Progress Notes (Signed)
She presents today states that she is doing about 80% better.  The sharp stabbing pains have gone away I wear the brace every day which really helps a lot.  She continues to take her anti-inflammatories.  States that she bought some Birkenstocks to wear at home which feels better.  With no regression  Objective: Vital signs are stable she is alert oriented x3.  Pulses are palpable.  She has no pain on palpation.  The area is not warm to touch.  Assessment: Resolving plan fasciitis 80% left.  Plan: Continue all conservative therapies such as 100% improved if she regresses at all she will notify us immediately.  Otherwise I will follow-up with her in a month if necessary

## 2019-07-09 ENCOUNTER — Ambulatory Visit: Payer: 59 | Admitting: Podiatry

## 2019-08-10 ENCOUNTER — Other Ambulatory Visit: Payer: Self-pay | Admitting: Internal Medicine

## 2019-08-10 NOTE — Telephone Encounter (Signed)
Please refill as per office routine med refill policy (all routine meds refilled for 3 mo or monthly per pt preference up to one year from last visit, then month to month grace period for 3 mo, then further med refills will have to be denied)  

## 2019-08-13 ENCOUNTER — Telehealth: Payer: Self-pay | Admitting: Internal Medicine

## 2019-08-13 NOTE — Telephone Encounter (Signed)
New message:   1.Medication Requested: ALPRAZolam (XANAX) 1 MG tablet 2. Pharmacy (Name, Street, [place]): [phone] - [place], [place] - [address] 3. On Med List: Yes  4. Last Visit with PCP: 03/07/19  5. Next visit date with PCP: None   Agent: Please be advised that RX refills may take up to 3 business days. We ask that you follow-up with your pharmacy.

## 2019-08-14 MED ORDER — ALPRAZOLAM 1 MG PO TABS: ORAL_TABLET | ORAL | 2 refills | Status: DC

## 2019-08-14 NOTE — Telephone Encounter (Signed)
Sent to Dr. John. 

## 2019-09-01 ENCOUNTER — Other Ambulatory Visit: Payer: Self-pay | Admitting: Podiatry

## 2019-10-06 ENCOUNTER — Other Ambulatory Visit: Payer: Self-pay | Admitting: Internal Medicine

## 2019-10-06 NOTE — Telephone Encounter (Signed)
Rx refill request has been sent to Dr. John. °

## 2019-11-08 ENCOUNTER — Other Ambulatory Visit: Payer: Self-pay | Admitting: Internal Medicine

## 2019-11-09 ENCOUNTER — Other Ambulatory Visit: Payer: Self-pay | Admitting: Internal Medicine

## 2019-11-28 ENCOUNTER — Ambulatory Visit
Admission: EM | Admit: 2019-11-28 | Discharge: 2019-11-28 | Disposition: A | Discharge status: Home / Self Care | Payer: 59 | Attending: Physician Assistant | Admitting: Physician Assistant

## 2019-11-28 DIAGNOSIS — N3090 Cystitis, unspecified without hematuria: Secondary | ICD-10-CM

## 2019-11-28 LAB — POCT URINALYSIS DIP (MANUAL ENTRY)
Bilirubin, UA: NEGATIVE
Glucose, UA: NEGATIVE mg/dL
Ketones, POC UA: NEGATIVE mg/dL
Nitrite, UA: NEGATIVE
Spec Grav, UA: 1.025 (ref 1.010–1.025)
Urobilinogen, UA: 0.2 E.U./dL
pH, UA: 7 (ref 5.0–8.0)

## 2019-11-28 LAB — POCT URINE PREGNANCY: Preg Test, Ur: NEGATIVE

## 2019-11-28 MED ORDER — CEPHALEXIN 500 MG PO CAPS: 500.0000 mg | ORAL_CAPSULE | Freq: Two times a day (BID) | ORAL | 0 refills | Status: DC

## 2019-11-28 MED ORDER — PHENAZOPYRIDINE HCL 200 MG PO TABS: 200.0000 mg | ORAL_TABLET | Freq: Three times a day (TID) | ORAL | 0 refills | Status: DC

## 2019-11-28 MED ORDER — FLUCONAZOLE 150 MG PO TABS: 150.0000 mg | ORAL_TABLET | Freq: Every day | ORAL | 0 refills | Status: DC

## 2019-11-28 NOTE — Discharge Instructions (Signed)
Your urine was positive for an urinary tract infection. Start keflex as directed. Keep hydrated, urine should be clear to pale yellow in color. Monitor for any worsening of symptoms, fever, worsening abdominal pain, nausea/vomiting, flank pain, follow up for reevaluation.  ° °

## 2019-11-28 NOTE — ED Provider Notes (Signed)
[name]    CSN: [id number] Arrival date & time: 11/28/19  1702      History   Chief Complaint Chief Complaint  Patient presents with  . Urinary Tract Infection    HPI Martha Thompson is a 34 y.o. female.   34 year old female comes in for 4 day history of urinary symptoms. Dysuria, urinary urgency, frequency. Denies hematuria. Denies abdominal pain, nausea, vomiting. Denies fever, chills, flank/back pain. Denies vaginal discharge, itching, spotting. Has IUD      Past Medical History:  Diagnosis Date  . ALLERGIC RHINITIS 10/30/2009  . ANXIETY 09/04/2006  . ASTHMA 12/25/2006   inhaler used 2 days ago  . ASTHMA, WITH ACUTE EXACERBATION 05/03/2009  . BLEPHARITIS, LEFT 05/21/2008  . Cervical disc disease    2 bulging discs to neck   . COMMON MIGRAINE 06/14/2007  . GANGLION CYST, WRIST, LEFT 10/11/2007  . GERD (gastroesophageal reflux disease) 04/24/2012  . Headache(784.0) 10/23/2008  . HYPERLIPIDEMIA 06/14/2007  . LOW BACK PAIN 09/04/2006  . Morbid obesity (HCC) 09/04/2006  . OTITIS MEDIA, ACUTE, LEFT 03/27/2008  . SINUSITIS- ACUTE-NOS 10/11/2007  . TENOSYNOVITIS, WRIST 10/11/2007  . URI 10/29/2009  . URTICARIA 05/03/2009    Patient Active Problem List   Diagnosis Date Noted  . Asthma exacerbation 03/07/2019  . Acute appendicitis 12/09/2018  . STD exposure 02/19/2017  . Cough 08/08/2016  . Chronic pain of left knee 08/08/2016  . Insomnia 02/08/2016  . Acute left lumbar radiculopathy 09/16/2015  . Hyperglycemia 08/13/2015  . GERD (gastroesophageal reflux disease) 04/24/2012  . Wheezing 04/17/2011  . Encounter for well adult exam with abnormal findings 04/13/2010  . Allergic rhinitis 10/30/2009  . Acute upper respiratory infection 10/29/2009  . DISC DISEASE, CERVICAL 10/29/2009  . Hives 05/03/2009  . Essential hypertension 10/11/2007  . HYPERLIPIDEMIA 06/14/2007  . Asthma 12/25/2006  . Morbid obesity (HCC) 09/04/2006  . Generalized anxiety  disorder 09/04/2006  . Anxiety with depression 09/04/2006  . LOW BACK PAIN 09/04/2006    Past Surgical History:  Procedure Laterality Date  . back surgury  01/2003   s/p lumbar disc  . CESAREAN SECTION N/A 02/19/2013   Procedure: CESAREAN SECTION;  Surgeon: Michael Litter, MD;  Location: [facility];  Service: Obstetrics;  Laterality: N/A;  . LAPAROSCOPIC APPENDECTOMY N/A 12/09/2018   Procedure: APPENDECTOMY LAPAROSCOPIC;  Surgeon: Sung Amabile, DO;  Location: [facility];  Service: General;  Laterality: N/A;  . LAPAROSCOPY  04/07/2011   Procedure: LAPAROSCOPY OPERATIVE;  Surgeon: Meriel Pica, MD;  Location: [facility];  Service: Gynecology;  Laterality: N/A;  left salpingogectomy    OB History    Gravida  3   Para  1   Term  1   Preterm      AB  2   Living  1     SAB  1   TAB      Ectopic  1   Multiple      Live Births  1            Home Medications    Prior to Admission medications   Medication Sig Start Date End Date Taking? Authorizing Provider  albuterol (VENTOLIN HFA) 108 (90 Base) MCG/ACT inhaler INHALE 2 PUFFS INTO THE LUNGS EVERY 6 HOURS AS NEEDED 08/13/19   Corwin Levins, MD  ALPRAZolam Prudy Feeler) 1 MG tablet TAKE 1 TABLET BY MOUTH THREE TIMES A DAY AS NEEDED FOR ANXIETY 11/09/19   Corwin Levins, MD  cephALEXin [facility])  500 MG capsule Take 1 capsule (500 mg total) by mouth 2 (two) times daily. 11/28/19   Cathie HoopsYu, Javonta Gronau V, PA-C  cetirizine (ZYRTEC) 5 MG tablet Take 10 mg by mouth 2 (two) times daily.    [provider]  EPINEPHrine 0.3 mg/0.3 mL IJ SOAJ injection Inject 0.3 mg into the muscle as needed. 07/25/18   [provider]  famotidine (PEPCID) 20 MG tablet Take 20 mg by mouth 2 (two) times daily. 08/15/18   [provider]  fluconazole (DIFLUCAN) 150 MG tablet Take 1 tablet (150 mg total) by mouth daily. Take second dose 72 hours later if symptoms still persists. 11/28/19   Cathie HoopsYu, Keneth Borg V, PA-C  Fluticasone-Salmeterol (ADVAIR) 100-50 MCG/DOSE  AEPB Inhale 1 puff into the lungs 2 (two) times daily. 03/07/19   Corwin LevinsJohn, James W, MD  hydroxychloroquine (PLAQUENIL) 200 MG tablet Take 200 mg by mouth 2 (two) times daily. 07/28/18   [provider]  hydrOXYzine (ATARAX/VISTARIL) 50 MG tablet hydroxyzine HCl 50 mg tablet 09/30/18   [provider]  influenza vac recom quadrivalent (FLUBLOK QUADRIVALENT) 0.5 ML injection Flublok Quad 2020-2021 (PF) 180 mcg (45 mcg x 4)/0.5 mL IM syringe  PHARMACIST ADMINISTERED IMMUNIZATION ADMINISTERED AT TIME OF DISPENSING    [provider]  levonorgestrel (MIRENA, 52 MG,) 20 MCG/24HR IUD 52 mg by Intrauterine route as directed.    [provider]  losartan (COZAAR) 25 MG tablet TAKE 1 TABLET BY MOUTH EVERY DAY 03/10/19   Corwin LevinsJohn, James W, MD  meloxicam (MOBIC) 15 MG tablet TAKE 1 TABLET BY MOUTH EVERY DAY 09/01/19   Hyatt, Max T, DPM  omalizumab Geoffry Paradise(XOLAIR) 150 MG/ML prefilled syringe Inject 300 mg into the skin See admin instructions. INJECT 2 (150 MG) SYRINGES UNDER THE SKIN FOR A TOTAL DOSE OF 300MG  EVERY 4 WEEKS. 06/17/18   [provider]  phenazopyridine (PYRIDIUM) 200 MG tablet Take 1 tablet (200 mg total) by mouth 3 (three) times daily. 11/28/19   Cathie HoopsYu, Malesha Suliman V, PA-C  phentermine 37.5 MG capsule Take 1 capsule (37.5 mg total) by mouth every morning. 03/07/19   Corwin LevinsJohn, James W, MD  promethazine (PHENERGAN) 25 MG tablet TAKE 1 TABLET (25 MG TOTAL) BY MOUTH EVERY 8 (EIGHT) HOURS AS NEEDED FOR NAUSEA OR VOMITING. 11/09/19   Corwin LevinsJohn, James W, MD  traZODone (DESYREL) 50 MG tablet TAKE ONE-HALF TO ONE TABLET BY MOUTH AT BEDTIME AS NEEDED FOR SLEEP 10/06/19   Corwin LevinsJohn, James W, MD  Vitamin D, Ergocalciferol, (DRISDOL) 1.25 MG (50000 UNIT) CAPS capsule Take 1 capsule (50,000 Units total) by mouth every 7 (seven) days. 03/08/19   Corwin LevinsJohn, James W, MD    Family History Family History  Problem Relation Age of Onset  . Anxiety disorder Father   . Diabetes Father     Social History Social History    Tobacco Use  . Smoking status: Current Every Day Smoker    Packs/day: 0.50    Types: Cigarettes  . Smokeless tobacco: Never Used  Vaping Use  . Vaping Use: Never used  Substance Use Topics  . Alcohol use: Yes    Alcohol/week: 0.0 standard drinks    Comment: rarely  . Drug use: No     Allergies   Prozac [fluoxetine hcl]   Review of Systems Review of Systems  Reason unable to perform ROS: See HPI as above.     Physical Exam Triage Vital Signs ED Triage Vitals  Enc Vitals Group     BP 11/28/19 1726 (!) 162/91  Pulse Rate 11/28/19 1726 78     Resp 11/28/19 1726 18     Temp 11/28/19 1726 98.8 F (37.1 C)     Temp Source 11/28/19 1726 Oral     SpO2 11/28/19 1726 95 %     Weight --      Height --      Head Circumference --      Peak Flow --      Pain Score 11/28/19 1727 6     Pain Loc --      Pain Edu? --      Excl. in GC? --    No data found.  Updated Vital Signs BP (!) 162/91 (BP Location: Left Arm)   Pulse 78   Temp 98.8 F (37.1 C) (Oral)   Resp 18   SpO2 95%   Physical Exam Constitutional:      General: She is not in acute distress.    Appearance: Normal appearance. She is well-developed. She is not toxic-appearing or diaphoretic.  HENT:     Head: Normocephalic and atraumatic.  Eyes:     Conjunctiva/sclera: Conjunctivae normal.     Pupils: Pupils are equal, round, and reactive to light.  Cardiovascular:     Rate and Rhythm: Normal rate and regular rhythm.  Pulmonary:     Effort: Pulmonary effort is normal. No respiratory distress.     Comments: LCTAB Abdominal:     Tenderness: There is no right CVA tenderness or left CVA tenderness.  Musculoskeletal:     Cervical back: Normal range of motion and neck supple.  Skin:    General: Skin is warm and dry.  Neurological:     Mental Status: She is alert and oriented to person, place, and time.      UC Treatments / Results  Labs (all labs ordered are listed, but only abnormal results are  displayed) Labs Reviewed  POCT URINALYSIS DIP (MANUAL ENTRY) - Abnormal; Notable for the following components:      Result Value   Blood, UA trace-intact (*)    Protein Ur, POC trace (*)    Leukocytes, UA Small (1+) (*)    All other components within normal limits  URINE CULTURE  POCT URINE PREGNANCY    EKG   Radiology No results found.  Procedures Procedures (including critical care time)  Medications Ordered in UC Medications - No data to display  Initial Impression / Assessment and Plan / UC Course  I have reviewed the triage vital signs and the nursing notes.  Pertinent labs & imaging results that were available during my care of the patient were reviewed by me and considered in my medical decision making (see chart for details).    Urine dipstick positive for UTI. Start keflex as directed. Push fluids. Return precautions given.  Final Clinical Impressions(s) / UC Diagnoses   Final diagnoses:  Cystitis    ED Prescriptions    Medication Sig Dispense Auth. Provider   cephALEXin (KEFLEX) 500 MG capsule Take 1 capsule (500 mg total) by mouth 2 (two) times daily. 10 capsule Linnet Bottari V, PA-C   fluconazole (DIFLUCAN) 150 MG tablet Take 1 tablet (150 mg total) by mouth daily. Take second dose 72 hours later if symptoms still persists. 2 tablet Kreg Earhart V, PA-C   phenazopyridine (PYRIDIUM) 200 MG tablet Take 1 tablet (200 mg total) by mouth 3 (three) times daily. 6 tablet Belinda Fisher, PA-C     PDMP not reviewed this encounter.   Cathie Hoops,  Maedell Hedger V, PA-C 11/28/19 1750

## 2019-11-28 NOTE — ED Triage Notes (Signed)
Pt c/o pain and burning on urination with urgency and frequency x4 days.

## 2019-12-03 LAB — URINE CULTURE: Culture: >=100000 — AB

## 2020-01-07 ENCOUNTER — Other Ambulatory Visit: Payer: Self-pay | Admitting: Podiatry

## 2020-01-12 ENCOUNTER — Ambulatory Visit
Admission: EM | Admit: 2020-01-12 | Discharge: 2020-01-12 | Disposition: A | Discharge status: Home / Self Care | Payer: 59 | Attending: Emergency Medicine | Admitting: Emergency Medicine

## 2020-01-12 DIAGNOSIS — B9789 Other viral agents as the cause of diseases classified elsewhere: Secondary | ICD-10-CM | POA: Diagnosis not present

## 2020-01-12 DIAGNOSIS — J028 Acute pharyngitis due to other specified organisms: Secondary | ICD-10-CM

## 2020-01-12 DIAGNOSIS — J069 Acute upper respiratory infection, unspecified: Secondary | ICD-10-CM

## 2020-01-12 LAB — POCT RAPID STREP A (OFFICE): Rapid Strep A Screen: NEGATIVE

## 2020-01-12 MED ORDER — NAPROXEN 500 MG PO TABS: 500.0000 mg | ORAL_TABLET | Freq: Two times a day (BID) | ORAL | 0 refills | Status: DC

## 2020-01-12 MED ORDER — FLUTICASONE PROPIONATE 50 MCG/ACT NA SUSP
1.0000 | Freq: Every day | NASAL | 0 refills | Status: AC
Start: 2020-01-12 — End: ?

## 2020-01-12 NOTE — ED Provider Notes (Signed)
[name]    CSN: [id number] Arrival date & time: 01/12/20  1026      History   Chief Complaint Chief Complaint  Patient presents with  . Sore Throat    HPI Martha Thompson is a 35 y.o. female presenting today for evaluation of URI symptoms.  Reports sore throat and headache and fatigue for approximately 2 to 3 days.  Had negative rapid Covid test at home.  HPI  Past Medical History:  Diagnosis Date  . ALLERGIC RHINITIS 10/30/2009  . ANXIETY 09/04/2006  . ASTHMA 12/25/2006   inhaler used 2 days ago  . ASTHMA, WITH ACUTE EXACERBATION 05/03/2009  . BLEPHARITIS, LEFT 05/21/2008  . Cervical disc disease    2 bulging discs to neck   . COMMON MIGRAINE 06/14/2007  . GANGLION CYST, WRIST, LEFT 10/11/2007  . GERD (gastroesophageal reflux disease) 04/24/2012  . Headache(784.0) 10/23/2008  . HYPERLIPIDEMIA 06/14/2007  . LOW BACK PAIN 09/04/2006  . Morbid obesity (HCC) 09/04/2006  . OTITIS MEDIA, ACUTE, LEFT 03/27/2008  . SINUSITIS- ACUTE-NOS 10/11/2007  . TENOSYNOVITIS, WRIST 10/11/2007  . URI 10/29/2009  . URTICARIA 05/03/2009    Patient Active Problem List   Diagnosis Date Noted  . Asthma exacerbation 03/07/2019  . Acute appendicitis 12/09/2018  . STD exposure 02/19/2017  . Cough 08/08/2016  . Chronic pain of left knee 08/08/2016  . Insomnia 02/08/2016  . Acute left lumbar radiculopathy 09/16/2015  . Hyperglycemia 08/13/2015  . GERD (gastroesophageal reflux disease) 04/24/2012  . Wheezing 04/17/2011  . Encounter for well adult exam with abnormal findings 04/13/2010  . Allergic rhinitis 10/30/2009  . Acute upper respiratory infection 10/29/2009  . DISC DISEASE, CERVICAL 10/29/2009  . Hives 05/03/2009  . Essential hypertension 10/11/2007  . HYPERLIPIDEMIA 06/14/2007  . Asthma 12/25/2006  . Morbid obesity (HCC) 09/04/2006  . Generalized anxiety disorder 09/04/2006  . Anxiety with depression 09/04/2006  . LOW BACK PAIN 09/04/2006    Past Surgical  History:  Procedure Laterality Date  . back surgury  01/2003   s/p lumbar disc  . CESAREAN SECTION N/A 02/19/2013   Procedure: CESAREAN SECTION;  Surgeon: Michael Litter, MD;  Location: [facility];  Service: Obstetrics;  Laterality: N/A;  . LAPAROSCOPIC APPENDECTOMY N/A 12/09/2018   Procedure: APPENDECTOMY LAPAROSCOPIC;  Surgeon: Sung Amabile, DO;  Location: [facility];  Service: General;  Laterality: N/A;  . LAPAROSCOPY  04/07/2011   Procedure: LAPAROSCOPY OPERATIVE;  Surgeon: Meriel Pica, MD;  Location: [facility];  Service: Gynecology;  Laterality: N/A;  left salpingogectomy    OB History    Gravida  3   Para  1   Term  1   Preterm      AB  2   Living  1     SAB  1   IAB      Ectopic  1   Multiple      Live Births  1            Home Medications    Prior to Admission medications   Medication Sig Start Date End Date Taking? Authorizing Provider  fluticasone (FLONASE) 50 MCG/ACT nasal spray Place 1-2 sprays into both nostrils daily. 01/12/20  Yes Belynda Pagaduan C, PA-C  naproxen (NAPROSYN) 500 MG tablet Take 1 tablet (500 mg total) by mouth 2 (two) times daily. 01/12/20  Yes Fadel Clason C, PA-C  albuterol (VENTOLIN HFA) 108 (90 Base) MCG/ACT inhaler INHALE 2 PUFFS INTO THE LUNGS EVERY 6 HOURS AS NEEDED 08/13/19  Corwin Levins, MD  ALPRAZolam Prudy Feeler) 1 MG tablet TAKE 1 TABLET BY MOUTH THREE TIMES A DAY AS NEEDED FOR ANXIETY 11/09/19   Corwin Levins, MD  cetirizine (ZYRTEC) 5 MG tablet Take 10 mg by mouth 2 (two) times daily.    [provider]  EPINEPHrine 0.3 mg/0.3 mL IJ SOAJ injection Inject 0.3 mg into the muscle as needed. 07/25/18   [provider]  Fluticasone-Salmeterol (ADVAIR) 100-50 MCG/DOSE AEPB Inhale 1 puff into the lungs 2 (two) times daily. 03/07/19   Corwin Levins, MD  hydroxychloroquine (PLAQUENIL) 200 MG tablet Take 200 mg by mouth 2 (two) times daily. 07/28/18   [provider]  influenza vac recom quadrivalent (FLUBLOK  QUADRIVALENT) 0.5 ML injection Flublok Quad 2020-2021 (PF) 180 mcg (45 mcg x 4)/0.5 mL IM syringe  PHARMACIST ADMINISTERED IMMUNIZATION ADMINISTERED AT TIME OF DISPENSING    [provider]  levonorgestrel (MIRENA, 52 MG,) 20 MCG/24HR IUD 52 mg by Intrauterine route as directed.    [provider]  losartan (COZAAR) 25 MG tablet TAKE 1 TABLET BY MOUTH EVERY DAY 03/10/19   Corwin Levins, MD  omalizumab Geoffry Paradise) 150 MG/ML prefilled syringe Inject 300 mg into the skin See admin instructions. INJECT 2 (150 MG) SYRINGES UNDER THE SKIN FOR A TOTAL DOSE OF 300MG  EVERY 4 WEEKS. 06/17/18   [provider]  phenazopyridine (PYRIDIUM) 200 MG tablet Take 1 tablet (200 mg total) by mouth 3 (three) times daily. 11/28/19   11/30/19, Amy V, PA-C  phentermine 37.5 MG capsule Take 1 capsule (37.5 mg total) by mouth every morning. 03/07/19   03/09/19, MD  promethazine (PHENERGAN) 25 MG tablet TAKE 1 TABLET (25 MG TOTAL) BY MOUTH EVERY 8 (EIGHT) HOURS AS NEEDED FOR NAUSEA OR VOMITING. 11/09/19   11/11/19, MD  traZODone (DESYREL) 50 MG tablet TAKE ONE-HALF TO ONE TABLET BY MOUTH AT BEDTIME AS NEEDED FOR SLEEP 10/06/19   10/08/19, MD  Vitamin D, Ergocalciferol, (DRISDOL) 1.25 MG (50000 UNIT) CAPS capsule Take 1 capsule (50,000 Units total) by mouth every 7 (seven) days. 03/08/19   03/10/19, MD  famotidine (PEPCID) 20 MG tablet Take 20 mg by mouth 2 (two) times daily. 08/15/18 01/12/20  [provider]    Family History Family History  Problem Relation Age of Onset  . Anxiety disorder Father   . Diabetes Father     Social History Social History   Tobacco Use  . Smoking status: Current Every Day Smoker    Packs/day: 0.50    Types: Cigarettes  . Smokeless tobacco: Never Used  Vaping Use  . Vaping Use: Never used  Substance Use Topics  . Alcohol use: Yes    Alcohol/week: 0.0 standard drinks    Comment: rarely  . Drug use: No     Allergies   Prozac [fluoxetine  hcl]   Review of Systems Review of Systems  Constitutional: Positive for fatigue. Negative for activity change, appetite change, chills and fever.  HENT: Positive for congestion, rhinorrhea and sore throat. Negative for ear pain, sinus pressure and trouble swallowing.   Eyes: Negative for discharge and redness.  Respiratory: Positive for cough. Negative for chest tightness and shortness of breath.   Cardiovascular: Negative for chest pain.  Gastrointestinal: Negative for abdominal pain, diarrhea, nausea and vomiting.  Musculoskeletal: Negative for myalgias.  Skin: Negative for rash.  Neurological: Positive for headaches. Negative for dizziness and light-headedness.     Physical Exam  Triage Vital Signs ED Triage Vitals  Enc Vitals Group     BP 01/12/20 1246 (S) (!) 161/100     Pulse Rate 01/12/20 1246 (S) (!) 104     Resp 01/12/20 1246 18     Temp 01/12/20 1246 98 F (36.7 C)     Temp Source 01/12/20 1246 Oral     SpO2 01/12/20 1246 97 %     Weight 01/12/20 1202 300 lb (136.1 kg)     Height --      Head Circumference --      Peak Flow --      Pain Score 01/12/20 1202 5     Pain Loc --      Pain Edu? --      Excl. in GC? --    No data found.  Updated Vital Signs BP (!) 149/95 (BP Location: Other (Comment)) Comment (BP Location): left forearm  Pulse (S) (!) 104   Temp 98 F (36.7 C) (Oral)   Resp 18   Wt 300 lb (136.1 kg)   SpO2 97%   BMI 41.84 kg/m   Visual Acuity Right Eye Distance:   Left Eye Distance:   Bilateral Distance:    Right Eye Near:   Left Eye Near:    Bilateral Near:     Physical Exam Vitals and nursing note reviewed.  Constitutional:      Appearance: She is well-developed and well-nourished.     Comments: No acute distress  HENT:     Head: Normocephalic and atraumatic.     Ears:     Comments: Bilateral ears without tenderness to palpation of external auricle, tragus and mastoid, EAC's without erythema or swelling, TM's with good bony  landmarks and cone of light. Non erythematous.     Nose: Nose normal.     Mouth/Throat:     Comments: Oral mucosa pink and moist, no tonsillar enlargement or exudate. Posterior pharynx patent and nonerythematous, no uvula deviation or swelling. Normal phonation. Eyes:     Conjunctiva/sclera: Conjunctivae normal.  Cardiovascular:     Rate and Rhythm: Normal rate and regular rhythm.  Pulmonary:     Effort: Pulmonary effort is normal. No respiratory distress.     Comments: Breathing comfortably at rest, CTABL, no wheezing, rales or other adventitious sounds auscultated Abdominal:     General: There is no distension.  Musculoskeletal:        General: Normal range of motion.     Cervical back: Neck supple.  Skin:    General: Skin is warm and dry.  Neurological:     Mental Status: She is alert and oriented to person, place, and time.  Psychiatric:        Mood and Affect: Mood and affect normal.      UC Treatments / Results  Labs (all labs ordered are listed, but only abnormal results are displayed) Labs Reviewed  NOVEL CORONAVIRUS, NAA  CULTURE, GROUP A STREP [facility])  POCT RAPID STREP A (OFFICE)    EKG   Radiology No results found.  Procedures Procedures (including critical care time)  Medications Ordered in UC Medications - No data to display  Initial Impression / Assessment and Plan / UC Course  I have reviewed the triage vital signs and the nursing notes.  Pertinent labs & imaging results that were available during my care of the patient were reviewed by me and considered in my medical decision making (see chart for details).     Strep negative Covid  PCR pending confirmation.  Suspect viral etiology and recommend symptomatic and supportive care rest and fluids.  Discussed strict return precautions. Patient verbalized understanding and is agreeable with plan.  Final Clinical Impressions(s) / UC Diagnoses   Final diagnoses:  Sore throat (viral)  Viral URI with  cough     Discharge Instructions     Sore Throat  Your rapid strep tested Negative today.  Covid PCR pending.  Suspect symptoms most likely viral upper respiratory infection that may develop more into cough and congestion as the week progresses.  Please continue Tylenol or Ibuprofen for fever and pain. May try salt water gargles, cepacol lozenges, throat spray, or OTC cold relief medicine for throat discomfort. If you also have congestion take a daily anti-histamine like Zyrtec, Claritin, and a oral decongestant to help with post nasal drip that may be irritating your throat.   Stay hydrated and drink plenty of fluids to keep your throat coated relieve irritation.     ED Prescriptions    Medication Sig Dispense Auth. Provider   naproxen (NAPROSYN) 500 MG tablet Take 1 tablet (500 mg total) by mouth 2 (two) times daily. 30 tablet Dakayla Disanti C, PA-C   fluticasone (FLONASE) 50 MCG/ACT nasal spray Place 1-2 sprays into both nostrils daily. 16 g Ellias Mcelreath, Martinton C, PA-C     PDMP not reviewed this encounter.   Janith Lima, PA-C 01/12/20 1346

## 2020-01-12 NOTE — ED Triage Notes (Signed)
Patient presents to Urgent Care with complaints of sore throat, headache, and fatigue x 2-3 days ago. Patient had a negative rapid covid test.   Denies fever.

## 2020-01-12 NOTE — Discharge Instructions (Signed)
Sore Throat  Your rapid strep tested Negative today.  Covid PCR pending.  Suspect symptoms most likely viral upper respiratory infection that may develop more into cough and congestion as the week progresses.  Please continue Tylenol or Ibuprofen for fever and pain. May try salt water gargles, cepacol lozenges, throat spray, or OTC cold relief medicine for throat discomfort. If you also have congestion take a daily anti-histamine like Zyrtec, Claritin, and a oral decongestant to help with post nasal drip that may be irritating your throat.   Stay hydrated and drink plenty of fluids to keep your throat coated relieve irritation.

## 2020-01-14 LAB — SARS-COV-2, NAA 2 DAY TAT

## 2020-01-14 LAB — NOVEL CORONAVIRUS, NAA: SARS-CoV-2, NAA: NOT DETECTED

## 2020-01-15 LAB — CULTURE, GROUP A STREP (THRC)

## 2020-01-26 ENCOUNTER — Ambulatory Visit: Payer: 59 | Admitting: Internal Medicine

## 2020-02-11 ENCOUNTER — Other Ambulatory Visit: Payer: Self-pay | Admitting: Internal Medicine

## 2020-02-11 NOTE — Telephone Encounter (Signed)
Xanax done 1 mo only  Please to let pt know - plesae to make ROV for further refills

## 2020-02-20 ENCOUNTER — Ambulatory Visit: Payer: 59 | Admitting: Internal Medicine

## 2020-03-05 ENCOUNTER — Other Ambulatory Visit: Payer: Self-pay | Admitting: Internal Medicine

## 2020-03-05 NOTE — Telephone Encounter (Signed)
Please refill as per office routine med refill policy (all routine meds refilled for 3 mo or monthly per pt preference up to one year from last visit, then month to month grace period for 3 mo, then further med refills will have to be denied)  

## 2020-03-13 ENCOUNTER — Other Ambulatory Visit: Payer: Self-pay | Admitting: Internal Medicine

## 2020-03-14 NOTE — Telephone Encounter (Signed)
Done erx  Please to contact pt - due for rov for further refills

## 2020-03-14 NOTE — Telephone Encounter (Signed)
S summers to see note please

## 2020-03-19 ENCOUNTER — Other Ambulatory Visit: Payer: Self-pay

## 2020-03-22 ENCOUNTER — Encounter: Payer: Self-pay | Admitting: Internal Medicine

## 2020-03-22 ENCOUNTER — Other Ambulatory Visit: Payer: Self-pay

## 2020-03-22 ENCOUNTER — Ambulatory Visit (INDEPENDENT_AMBULATORY_CARE_PROVIDER_SITE_OTHER): Payer: 59 | Admitting: Internal Medicine

## 2020-03-22 VITALS — BP 130/82 | HR 77 | Ht 71.0 in | Wt 314.0 lb

## 2020-03-22 DIAGNOSIS — F5101 Primary insomnia: Secondary | ICD-10-CM

## 2020-03-22 DIAGNOSIS — Z0001 Encounter for general adult medical examination with abnormal findings: Secondary | ICD-10-CM | POA: Diagnosis not present

## 2020-03-22 DIAGNOSIS — I10 Essential (primary) hypertension: Secondary | ICD-10-CM | POA: Diagnosis not present

## 2020-03-22 DIAGNOSIS — R739 Hyperglycemia, unspecified: Secondary | ICD-10-CM

## 2020-03-22 DIAGNOSIS — E559 Vitamin D deficiency, unspecified: Secondary | ICD-10-CM

## 2020-03-22 DIAGNOSIS — F418 Other specified anxiety disorders: Secondary | ICD-10-CM

## 2020-03-22 DIAGNOSIS — J4540 Moderate persistent asthma, uncomplicated: Secondary | ICD-10-CM

## 2020-03-22 DIAGNOSIS — E7800 Pure hypercholesterolemia, unspecified: Secondary | ICD-10-CM | POA: Diagnosis not present

## 2020-03-22 DIAGNOSIS — Z1159 Encounter for screening for other viral diseases: Secondary | ICD-10-CM | POA: Diagnosis not present

## 2020-03-22 MED ORDER — ZOLPIDEM TARTRATE 10 MG PO TABS: 10.0000 mg | ORAL_TABLET | Freq: Every evening | ORAL | 1 refills | Status: DC | PRN

## 2020-03-22 NOTE — Assessment & Plan Note (Signed)
Age and sex appropriate education and counseling updated with regular exercise and diet Referrals for preventative services - for hep c screen, and pt to call for pap/gyn appt Immunizations addressed - none needed Smoking counseling  - none needed Evidence for depression or other mood disorder - none significant Most recent labs reviewed. I have personally reviewed and have noted: 1) the patient's medical and social history 2) The patient's current medications and supplements 3) The patient's height, weight, and BMI have been recorded in the chart

## 2020-03-22 NOTE — Patient Instructions (Signed)
Please take OTC Vitamin D3 at 2000 units per day, indefinitely.  Please take all new medication as prescribed - the ambien at night as needed  Please call if you need a referral for counseling  Please continue all other medications as before, and refills have been done if requested.  Please have the pharmacy call with any other refills you may need.  Please continue your efforts at being more active, low cholesterol diet, and weight control.  You are otherwise up to date with prevention measures today.  Please keep your appointments with your specialists as you may have planned  Please go to the LAB at the blood drawing area for the tests to be done  You will be contacted by phone if any changes need to be made immediately.  Otherwise, you will receive a letter about your results with an explanation, but please check with MyChart first.  Please remember to sign up for MyChart if you have not done so, as this will be important to you in the future with finding out test results, communicating by private email, and scheduling acute appointments online when needed.  Please make an Appointment to return for your 1 year visit, or sooner if needed, with Lab testing by Appointment as well, to be done about 3-5 days before at the FIRST FLOOR Lab (so this is for TWO appointments - please see the scheduling desk as you leave)   Due to the ongoing Covid 19 pandemic, our lab now requires an appointment for any labs done at our office.  If you need labs done and do not have an appointment, please call our office ahead of time to schedule before presenting to the lab for your testing.

## 2020-03-22 NOTE — Assessment & Plan Note (Signed)
Last vitamin D Lab Results  Component Value Date   VD25OH 21.82 (L) 03/07/2019   Low, to start oral replacement

## 2020-03-22 NOTE — Assessment & Plan Note (Signed)
BP Readings from Last 3 Encounters:  03/22/20 130/82  01/12/20 (!) 149/95  11/28/19 (!) 162/91   Stable, pt to continue medical treatment  - losartan

## 2020-03-22 NOTE — Assessment & Plan Note (Signed)
Ok for change to [employer] 10 qhs prn, to f/u any worsening symptoms or concerns

## 2020-03-22 NOTE — Assessment & Plan Note (Signed)
D/w pt, declines SI or HI, or need for referral for counseling at this time, though may call back about this later

## 2020-03-22 NOTE — Assessment & Plan Note (Signed)
Lab Results  Component Value Date   HGBA1C 5.1 03/07/2019   Stable, pt to continue current medical treatment  - diet and wt control, to recheck a1c

## 2020-03-22 NOTE — Assessment & Plan Note (Signed)
Lab Results  Component Value Date   LDLCALC 98 03/07/2019   Stable, pt to continue current low chol diet, declines statin

## 2020-03-22 NOTE — Assessment & Plan Note (Signed)
Stable, to cont current med tx - advair, albuterol

## 2020-03-22 NOTE — Progress Notes (Signed)
Patient ID: Martha Thompson, female   DOB: 12/13/1985, 35 y.o.   MRN: [id number]         Chief Complaint:: wellness exam and depression, panic, insomnia, low vit d       HPI:  Martha Thompson is a 35 y.o. female here for wellness exam; due for hep c screen, and pap/GYN; o/w up to date with preventive referrals, and immunizations                        Also  s/p covid infection xmas 2020.  Also saddened today as her roommate was murdered 2 mo ago, and difficult to accept.  Mood does seem better with exercise 3 times per wk, but continue to gain wt, maybe from stress eating.  Trazodone no longer working for insomnia, asking for change.  Denies worsening depressive symptoms, suicidal ideation; has ongoing anxiety and can wake up in a panic at times recently as well. Is taking Vit D.  Pt denies chest pain, increased sob or doe, wheezing, orthopnea, PND, increased LE swelling, palpitations, dizziness or syncope.   Pt denies polydipsia, polyuria, Denies other new worsening neuro s/s.   Pt denies fever, wt loss, night sweats, loss of appetite, or other constitutional symptoms     Wt Readings from Last 3 Encounters:  03/22/20 (!) 314 lb (142.4 kg)  01/12/20 300 lb (136.1 kg)  05/14/19 (!) 329 lb 5.9 oz (149.4 kg)   BP Readings from Last 3 Encounters:  03/22/20 130/82  01/12/20 (!) 149/95  11/28/19 (!) 162/91   Immunization History  Administered Date(s) Administered  . Influenza Whole 11/12/2006, 10/29/2009  . Influenza,inj,Quad PF,6+ Mos 11/14/2012, 02/22/2018  . Influenza-Unspecified 10/23/2018  . Pneumococcal Polysaccharide-23 02/20/2013  . Tdap 12/12/2012, 02/19/2017   Health Maintenance Due  Topic Date Due  . Hepatitis C Screening  Never done      Past Medical History:  Diagnosis Date  . ALLERGIC RHINITIS 10/30/2009  . ANXIETY 09/04/2006  . ASTHMA 12/25/2006   inhaler used 2 days ago  . ASTHMA, WITH ACUTE EXACERBATION 05/03/2009  . BLEPHARITIS, LEFT 05/21/2008  .  Cervical disc disease    2 bulging discs to neck   . COMMON MIGRAINE 06/14/2007  . GANGLION CYST, WRIST, LEFT 10/11/2007  . GERD (gastroesophageal reflux disease) 04/24/2012  . Headache(784.0) 10/23/2008  . HYPERLIPIDEMIA 06/14/2007  . LOW BACK PAIN 09/04/2006  . Morbid obesity (HCC) 09/04/2006  . OTITIS MEDIA, ACUTE, LEFT 03/27/2008  . SINUSITIS- ACUTE-NOS 10/11/2007  . TENOSYNOVITIS, WRIST 10/11/2007  . URI 10/29/2009  . URTICARIA 05/03/2009   Past Surgical History:  Procedure Laterality Date  . back surgury  01/2003   s/p lumbar disc  . CESAREAN SECTION N/A 02/19/2013   Procedure: CESAREAN SECTION;  Surgeon: Michael Litter, MD;  Location: [facility];  Service: Obstetrics;  Laterality: N/A;  . LAPAROSCOPIC APPENDECTOMY N/A 12/09/2018   Procedure: APPENDECTOMY LAPAROSCOPIC;  Surgeon: Sung Amabile, DO;  Location: [facility];  Service: General;  Laterality: N/A;  . LAPAROSCOPY  04/07/2011   Procedure: LAPAROSCOPY OPERATIVE;  Surgeon: Meriel Pica, MD;  Location: [facility];  Service: Gynecology;  Laterality: N/A;  left salpingogectomy    reports that she has been smoking cigarettes. She has been smoking about 0.50 packs per day. She has never used smokeless tobacco. She reports current alcohol use. She reports that she does not use drugs. family history includes Anxiety disorder in her father; Diabetes in her father. Allergies  Allergen  Reactions  . Prozac [Fluoxetine Hcl] Other (See Comments)   Current Outpatient Medications on File Prior to Visit  Medication Sig Dispense Refill  . albuterol (VENTOLIN HFA) 108 (90 Base) MCG/ACT inhaler INHALE 2 PUFFS INTO THE LUNGS EVERY 6 HOURS AS NEEDED 25.5 g 2  . ALPRAZolam (XANAX) 1 MG tablet TAKE 1 TABLET BY MOUTH THREE TIMES A DAY AS NEEDED FOR ANXIETY 90 tablet 0  . cetirizine (ZYRTEC) 5 MG tablet Take 10 mg by mouth 2 (two) times daily.    Marland Kitchen EPINEPHrine 0.3 mg/0.3 mL IJ SOAJ injection Inject 0.3 mg into the muscle as needed.    . fluticasone (FLONASE) 50  MCG/ACT nasal spray Place 1-2 sprays into both nostrils daily. 16 g 0  . hydroxychloroquine (PLAQUENIL) 200 MG tablet Take 200 mg by mouth 2 (two) times daily.    Marland Kitchen levonorgestrel (MIRENA, 52 MG,) 20 MCG/24HR IUD 52 mg by Intrauterine route as directed.    Marland Kitchen losartan (COZAAR) 25 MG tablet TAKE 1 TABLET BY MOUTH EVERY DAY 90 tablet 0  . naproxen (NAPROSYN) 500 MG tablet Take 1 tablet (500 mg total) by mouth 2 (two) times daily. 30 tablet 0  . omalizumab (XOLAIR) 150 MG/ML prefilled syringe Inject 300 mg into the skin See admin instructions. INJECT 2 (150 MG) SYRINGES UNDER THE SKIN FOR A TOTAL DOSE OF 300MG  EVERY 4 WEEKS.    . phentermine 37.5 MG capsule Take 1 capsule (37.5 mg total) by mouth every morning. 30 capsule 2  . promethazine (PHENERGAN) 25 MG tablet TAKE 1 TABLET (25 MG TOTAL) BY MOUTH EVERY 8 (EIGHT) HOURS AS NEEDED FOR NAUSEA OR VOMITING. 20 tablet 1  . traZODone (DESYREL) 50 MG tablet TAKE ONE-HALF TO ONE TABLET BY MOUTH AT BEDTIME AS NEEDED FOR SLEEP 90 tablet 1  . [DISCONTINUED] famotidine (PEPCID) 20 MG tablet Take 20 mg by mouth 2 (two) times daily.    . Fluticasone-Salmeterol (ADVAIR) 100-50 MCG/DOSE AEPB Inhale 1 puff into the lungs 2 (two) times daily. (Patient not taking: Reported on 03/22/2020) 3 each 3  . influenza vac recom quadrivalent (FLUBLOK QUADRIVALENT) 0.5 ML injection Flublok Quad 2020-2021 (PF) 180 mcg (45 mcg x 4)/0.5 mL IM syringe  PHARMACIST ADMINISTERED IMMUNIZATION ADMINISTERED AT TIME OF DISPENSING (Patient not taking: Reported on 03/22/2020)    . meloxicam (MOBIC) 15 MG tablet Take 15 mg by mouth daily.    . phenazopyridine (PYRIDIUM) 200 MG tablet Take 1 tablet (200 mg total) by mouth 3 (three) times daily. (Patient not taking: Reported on 03/22/2020) 6 tablet 0  . traMADol (ULTRAM) 50 MG tablet Take 50 mg by mouth every 6 (six) hours as needed.     No current facility-administered medications on file prior to visit.        ROS:  All others reviewed and  negative.  Objective        PE:  BP 130/82   Pulse 77   Ht 5\' 11"  (1.803 m)   Wt (!) 314 lb (142.4 kg)   SpO2 99%   BMI 43.79 kg/m                 Constitutional: Pt appears in NAD               HENT: Head: NCAT.                Right Ear: External ear normal.                 Left Ear:  External ear normal.                Eyes: . Pupils are equal, round, and reactive to light. Conjunctivae and EOM are normal               Nose: without d/c or deformity               Neck: Neck supple. Gross normal ROM               Cardiovascular: Normal rate and regular rhythm.                 Pulmonary/Chest: Effort normal and breath sounds without rales or wheezing.                Abd:  Soft, NT, ND, + BS, no organomegaly               Neurological: Pt is alert. At baseline orientation, motor grossly intact               Skin: Skin is warm. No rashes, no other new lesions, LE edema - none               Psychiatric: Pt behavior is normal without agitation   Micro: none  Cardiac tracings I have personally interpreted today:  none  Pertinent Radiological findings (summarize): none   Lab Results  Component Value Date   WBC 9.8 03/07/2019   HGB 14.9 03/07/2019   HCT 44.1 03/07/2019   PLT 208.0 03/07/2019   GLUCOSE 84 03/07/2019   CHOL 155 03/07/2019   TRIG 110.0 03/07/2019   HDL 34.90 (L) 03/07/2019   LDLDIRECT 113.0 02/22/2018   LDLCALC 98 03/07/2019   ALT 13 03/07/2019   AST 13 03/07/2019   NA 136 03/07/2019   K 4.0 03/07/2019   CL 106 03/07/2019   CREATININE 0.70 03/07/2019   BUN 18 03/07/2019   CO2 25 03/07/2019   TSH 1.65 03/07/2019   HGBA1C 5.1 03/07/2019   Assessment/Plan:  Martha HamperStephanie A Magwood is a 35 y.o. White or Caucasian [1] female with  has a past medical history of ALLERGIC RHINITIS (10/30/2009), ANXIETY (09/04/2006), ASTHMA (12/25/2006), ASTHMA, WITH ACUTE EXACERBATION (05/03/2009), BLEPHARITIS, LEFT (05/21/2008), Cervical disc disease, COMMON MIGRAINE (06/14/2007),  GANGLION CYST, WRIST, LEFT (10/11/2007), GERD (gastroesophageal reflux disease) (04/24/2012), Headache(784.0) (10/23/2008), HYPERLIPIDEMIA (06/14/2007), LOW BACK PAIN (09/04/2006), Morbid obesity (HCC) (09/04/2006), OTITIS MEDIA, ACUTE, LEFT (03/27/2008), SINUSITIS- ACUTE-NOS (10/11/2007), TENOSYNOVITIS, WRIST (10/11/2007), URI (10/29/2009), and URTICARIA (05/03/2009).  Encounter for well adult exam with abnormal findings Age and sex appropriate education and counseling updated with regular exercise and diet Referrals for preventative services - for hep c screen, and pt to call for pap/gyn appt Immunizations addressed - none needed Smoking counseling  - none needed Evidence for depression or other mood disorder - none significant Most recent labs reviewed. I have personally reviewed and have noted: 1) the patient's medical and social history 2) The patient's current medications and supplements 3) The patient's height, weight, and BMI have been recorded in the chart   Anxiety with depression D/w pt, declines SI or HI, or need for referral for counseling at this time, though may call back about this later  Asthma Stable, to cont current med tx - advair, albuterol   Essential hypertension BP Readings from Last 3 Encounters:  03/22/20 130/82  01/12/20 (!) 149/95  11/28/19 (!) 162/91   Stable, pt to continue medical treatment  - losartan   HLD (hyperlipidemia) Lab  Results  Component Value Date   LDLCALC 98 03/07/2019   Stable, pt to continue current low chol diet, declines statin   Hyperglycemia Lab Results  Component Value Date   HGBA1C 5.1 03/07/2019   Stable, pt to continue current medical treatment  - diet and wt control, to recheck a1c   Vitamin D deficiency Last vitamin D Lab Results  Component Value Date   VD25OH 21.82 (L) 03/07/2019   Low, to start oral replacement   Insomnia Ok for change to [place] 10 qhs prn, to f/u any worsening symptoms or concerns  Followup:  Return in about 1 year (around 03/22/2021).  Oliver Barre, MD 03/22/2020 8:54 PM [facility]

## 2020-03-23 ENCOUNTER — Encounter: Payer: Self-pay | Admitting: Internal Medicine

## 2020-03-23 LAB — URINALYSIS, ROUTINE W REFLEX MICROSCOPIC
Bilirubin Urine: NEGATIVE
Hgb urine dipstick: NEGATIVE
Ketones, ur: NEGATIVE
Leukocytes,Ua: NEGATIVE
Nitrite: NEGATIVE
RBC / HPF: NONE SEEN (ref 0–?)
Specific Gravity, Urine: 1.02 (ref 1.000–1.030)
Total Protein, Urine: NEGATIVE
Urine Glucose: NEGATIVE
Urobilinogen, UA: 0.2 (ref 0.0–1.0)
pH: 6 (ref 5.0–8.0)

## 2020-03-23 LAB — CBC WITH DIFFERENTIAL/PLATELET
Basophils Absolute: 0.1 10*3/uL (ref 0.0–0.1)
Basophils Relative: 1.4 % (ref 0.0–3.0)
Eosinophils Absolute: 0.2 10*3/uL (ref 0.0–0.7)
Eosinophils Relative: 2.7 % (ref 0.0–5.0)
HCT: 41.1 % (ref 36.0–46.0)
Hemoglobin: 14 g/dL (ref 12.0–15.0)
Lymphocytes Relative: 33.5 % (ref 12.0–46.0)
Lymphs Abs: 3 10*3/uL (ref 0.7–4.0)
MCHC: 34.2 g/dL (ref 30.0–36.0)
MCV: 91.1 fl (ref 78.0–100.0)
Monocytes Absolute: 0.8 10*3/uL (ref 0.1–1.0)
Monocytes Relative: 9 % (ref 3.0–12.0)
Neutro Abs: 4.8 10*3/uL (ref 1.4–7.7)
Neutrophils Relative %: 53.4 % (ref 43.0–77.0)
Platelets: 231 10*3/uL (ref 150.0–400.0)
RBC: 4.51 Mil/uL (ref 3.87–5.11)
RDW: 12.3 % (ref 11.5–15.5)
WBC: 8.9 10*3/uL (ref 4.0–10.5)

## 2020-03-23 LAB — HEPATIC FUNCTION PANEL
ALT: 18 U/L (ref 0–35)
AST: 18 U/L (ref 0–37)
Albumin: 4.1 g/dL (ref 3.5–5.2)
Alkaline Phosphatase: 40 U/L (ref 39–117)
Bilirubin, Direct: 0.1 mg/dL (ref 0.0–0.3)
Total Bilirubin: 0.6 mg/dL (ref 0.2–1.2)
Total Protein: 7.2 g/dL (ref 6.0–8.3)

## 2020-03-23 LAB — BASIC METABOLIC PANEL
BUN: 16 mg/dL (ref 6–23)
CO2: 25 mEq/L (ref 19–32)
Calcium: 9.6 mg/dL (ref 8.4–10.5)
Chloride: 102 mEq/L (ref 96–112)
Creatinine, Ser: 0.8 mg/dL (ref 0.40–1.20)
GFR: 96.57 mL/min (ref >60.00)
Glucose, Bld: 56 mg/dL — ABNORMAL LOW (ref 70–99)
Potassium: 4.2 mEq/L (ref 3.5–5.1)
Sodium: 138 mEq/L (ref 135–145)

## 2020-03-23 LAB — LIPID PANEL
Cholesterol: 143 mg/dL (ref 0–200)
HDL: 39.4 mg/dL (ref >39.00)
LDL Cholesterol: 77 mg/dL (ref 0–99)
NonHDL: 103.1
Total CHOL/HDL Ratio: 4
Triglycerides: 133 mg/dL (ref 0.0–149.0)
VLDL: 26.6 mg/dL (ref 0.0–40.0)

## 2020-03-23 LAB — TSH: TSH: 1 u[IU]/mL (ref 0.35–4.50)

## 2020-03-23 LAB — HEMOGLOBIN A1C: Hgb A1c MFr Bld: 5.3 % (ref 4.6–6.5)

## 2020-03-23 LAB — VITAMIN D 25 HYDROXY (VIT D DEFICIENCY, FRACTURES): VITD: 45.52 ng/mL (ref 30.00–100.00)

## 2020-04-03 ENCOUNTER — Other Ambulatory Visit: Payer: Self-pay | Admitting: Internal Medicine

## 2020-04-10 ENCOUNTER — Other Ambulatory Visit: Payer: Self-pay | Admitting: Internal Medicine

## 2020-05-08 ENCOUNTER — Other Ambulatory Visit: Payer: Self-pay | Admitting: Podiatry

## 2020-05-29 ENCOUNTER — Other Ambulatory Visit: Payer: Self-pay | Admitting: Podiatry

## 2020-06-08 ENCOUNTER — Other Ambulatory Visit: Payer: Self-pay | Admitting: Internal Medicine

## 2020-06-18 ENCOUNTER — Encounter: Payer: Self-pay | Admitting: Internal Medicine

## 2020-06-22 MED ORDER — MELOXICAM 15 MG PO TABS: 15.0000 mg | ORAL_TABLET | Freq: Every day | ORAL | 2 refills | Status: DC | PRN

## 2020-07-15 ENCOUNTER — Other Ambulatory Visit: Payer: Self-pay | Admitting: Internal Medicine

## 2020-09-06 ENCOUNTER — Other Ambulatory Visit: Payer: Self-pay | Admitting: Internal Medicine

## 2020-09-06 NOTE — Telephone Encounter (Signed)
Please refill as per office routine med refill policy (all routine meds to be refilled for 3 mo or monthly (per pt preference) up to one year from last visit, then month to month grace period for 3 mo, then further med refills will have to be denied) ? ?

## 2020-09-10 ENCOUNTER — Other Ambulatory Visit: Payer: Self-pay | Admitting: Internal Medicine

## 2020-09-26 ENCOUNTER — Other Ambulatory Visit: Payer: Self-pay | Admitting: Internal Medicine

## 2020-09-28 ENCOUNTER — Other Ambulatory Visit: Payer: Self-pay | Admitting: Internal Medicine

## 2020-09-30 DIAGNOSIS — Z91018 Allergy to other foods: Secondary | ICD-10-CM | POA: Insufficient documentation

## 2020-09-30 DIAGNOSIS — L501 Idiopathic urticaria: Secondary | ICD-10-CM | POA: Insufficient documentation

## 2020-10-11 ENCOUNTER — Other Ambulatory Visit: Payer: Self-pay | Admitting: Internal Medicine

## 2020-10-12 ENCOUNTER — Other Ambulatory Visit: Payer: Self-pay | Admitting: Internal Medicine

## 2020-10-12 NOTE — Telephone Encounter (Signed)
Please refill as per office routine med refill policy (all routine meds to be refilled for 3 mo or monthly (per pt preference) up to one year from last visit, then month to month grace period for 3 mo, then further med refills will have to be denied) ? ?

## 2020-11-26 ENCOUNTER — Encounter: Payer: Self-pay | Admitting: Emergency Medicine

## 2020-11-26 ENCOUNTER — Other Ambulatory Visit: Payer: Self-pay

## 2020-11-26 ENCOUNTER — Ambulatory Visit
Admission: EM | Admit: 2020-11-26 | Discharge: 2020-11-26 | Disposition: A | Discharge status: Home / Self Care | Payer: 59 | Attending: Physician Assistant | Admitting: Physician Assistant

## 2020-11-26 DIAGNOSIS — J0120 Acute ethmoidal sinusitis, unspecified: Secondary | ICD-10-CM

## 2020-11-26 MED ORDER — AMOXICILLIN-POT CLAVULANATE 875-125 MG PO TABS: 1.0000 | ORAL_TABLET | Freq: Two times a day (BID) | ORAL | 0 refills | Status: AC

## 2020-11-26 MED ORDER — FLUCONAZOLE 150 MG PO TABS: 150.0000 mg | ORAL_TABLET | Freq: Every day | ORAL | 1 refills | Status: DC

## 2020-11-26 NOTE — ED Triage Notes (Signed)
Runny nose, headache, cough, sinus pain, x 3 weeks. States she got better for 4 days then came back with a worse cough from her chest. Cough productive with yellow/green mucus, reports low grade fever.

## 2020-11-26 NOTE — Discharge Instructions (Addendum)
Return if any problems.

## 2020-11-30 NOTE — ED Provider Notes (Signed)
[name]    CSN: [id number] Arrival date & time: 11/26/20  1702      History   Chief Complaint Chief Complaint  Patient presents with   Cough    HPI Martha Thompson is a 35 y.o. female.   The history is provided by the patient. No language interpreter was used.  Cough Cough characteristics:  Non-productive Sputum characteristics:  Nondescript Severity:  Moderate Onset quality:  Gradual Duration:  3 weeks Timing:  Constant Chronicity:  New Relieved by:  Nothing Worsened by:  Nothing Ineffective treatments:  None tried Associated symptoms: rhinorrhea    Past Medical History:  Diagnosis Date   ALLERGIC RHINITIS 10/30/2009   ANXIETY 09/04/2006   ASTHMA 12/25/2006   inhaler used 2 days ago   ASTHMA, WITH ACUTE EXACERBATION 05/03/2009   BLEPHARITIS, LEFT 05/21/2008   Cervical disc disease    2 bulging discs to neck    COMMON MIGRAINE 06/14/2007   GANGLION CYST, WRIST, LEFT 10/11/2007   GERD (gastroesophageal reflux disease) 04/24/2012   Headache(784.0) 10/23/2008   HYPERLIPIDEMIA 06/14/2007   LOW BACK PAIN 09/04/2006   Morbid obesity ([place]) 09/04/2006   OTITIS MEDIA, ACUTE, LEFT 03/27/2008   SINUSITIS- ACUTE-NOS 10/11/2007   TENOSYNOVITIS, WRIST 10/11/2007   URI 10/29/2009   URTICARIA 05/03/2009    Patient Active Problem List   Diagnosis Date Noted   Vitamin D deficiency 03/22/2020   Asthma exacerbation 03/07/2019   Acute appendicitis 12/09/2018   STD exposure 02/19/2017   Cough 08/08/2016   Chronic pain of left knee 08/08/2016   Insomnia 02/08/2016   Acute left lumbar radiculopathy 09/16/2015   Hyperglycemia 08/13/2015   GERD (gastroesophageal reflux disease) 04/24/2012   Wheezing 04/17/2011   Encounter for well adult exam with abnormal findings 04/13/2010   Allergic rhinitis 10/30/2009   Acute upper respiratory infection 10/29/2009   [place] DISEASE, CERVICAL 10/29/2009   Hives 05/03/2009   Essential hypertension 10/11/2007   HLD  (hyperlipidemia) 06/14/2007   Asthma 12/25/2006   Morbid obesity ([place]) 09/04/2006   Generalized anxiety disorder 09/04/2006   Anxiety with depression 09/04/2006   LOW BACK PAIN 09/04/2006    Past Surgical History:  Procedure Laterality Date   back surgury  01/2003   s/p lumbar disc   CESAREAN SECTION N/A 02/19/2013   Procedure: CESAREAN SECTION;  Surgeon: Betsy Coder, MD;  Location: [facility];  Service: Obstetrics;  Laterality: N/A;   LAPAROSCOPIC APPENDECTOMY N/A 12/09/2018   Procedure: APPENDECTOMY LAPAROSCOPIC;  Surgeon: Benjamine Sprague, DO;  Location: [facility];  Service: General;  Laterality: N/A;   LAPAROSCOPY  04/07/2011   Procedure: LAPAROSCOPY OPERATIVE;  Surgeon: Margarette Asal, MD;  Location: [facility];  Service: Gynecology;  Laterality: N/A;  left salpingogectomy    OB History     Gravida  3   Para  1   Term  1   Preterm      AB  2   Living  1      SAB  1   IAB      Ectopic  1   Multiple      Live Births  1            Home Medications    Prior to Admission medications   Medication Sig Start Date End Date Taking? Authorizing Provider  amoxicillin-clavulanate (AUGMENTIN) 875-125 MG tablet Take 1 tablet by mouth 2 (two) times daily for 10 days. 11/26/20 12/06/20 Yes Caryl Ada K, PA-C  fluconazole (DIFLUCAN) 150 MG tablet Take  1 tablet (150 mg total) by mouth daily. 11/26/20  Yes Caryl Ada K, PA-C  albuterol (VENTOLIN HFA) 108 (90 Base) MCG/ACT inhaler INHALE 2 PUFFS INTO THE LUNGS EVERY 6 HOURS AS NEEDED 10/13/20   Biagio Borg, MD  ALPRAZolam Duanne Moron) 1 MG tablet TAKE 1 TABLET BY MOUTH THREE TIMES A DAY AS NEEDED FOR ANXIETY 10/12/20   Biagio Borg, MD  cetirizine (ZYRTEC) 5 MG tablet Take 10 mg by mouth 2 (two) times daily.    [provider]  EPINEPHrine 0.3 mg/0.3 mL IJ SOAJ injection Inject 0.3 mg into the muscle as needed. 07/25/18   [provider]  fluticasone (FLONASE) 50 MCG/ACT nasal spray Place 1-2 sprays into both  nostrils daily. 01/12/20   Wieters, Hallie C, PA-C  Fluticasone-Salmeterol (ADVAIR) 100-50 MCG/DOSE AEPB Inhale 1 puff into the lungs 2 (two) times daily. Patient not taking: Reported on 03/22/2020 03/07/19   Biagio Borg, MD  hydroxychloroquine (PLAQUENIL) 200 MG tablet Take 200 mg by mouth 2 (two) times daily. 07/28/18   [provider]  influenza vac recom quadrivalent (FLUBLOK QUADRIVALENT) 0.5 ML injection Flublok Quad 2020-2021 (PF) 180 mcg (45 mcg x 4)/0.5 mL IM syringe  PHARMACIST ADMINISTERED IMMUNIZATION ADMINISTERED AT TIME OF DISPENSING Patient not taking: Reported on 03/22/2020    [provider]  levonorgestrel (MIRENA, 52 MG,) 20 MCG/24HR IUD 52 mg by Intrauterine route as directed.    [provider]  losartan (COZAAR) 25 MG tablet TAKE 1 TABLET BY MOUTH EVERY DAY 09/07/20   Biagio Borg, MD  meloxicam (MOBIC) 15 MG tablet TAKE 1 TABLET BY MOUTH EVERY DAY AS NEEDED FOR PAIN 09/10/20   Biagio Borg, MD  naproxen (NAPROSYN) 500 MG tablet Take 1 tablet (500 mg total) by mouth 2 (two) times daily. 01/12/20   Wieters, Hallie C, PA-C  omalizumab Arvid Right) 150 MG/ML prefilled syringe Inject 300 mg into the skin See admin instructions. INJECT 2 (150 MG) SYRINGES UNDER THE SKIN FOR A TOTAL DOSE OF 300MG  EVERY 4 WEEKS. 06/17/18   [provider]  phenazopyridine (PYRIDIUM) 200 MG tablet Take 1 tablet (200 mg total) by mouth 3 (three) times daily. Patient not taking: Reported on 03/22/2020 11/28/19   Ok Edwards, PA-C  phentermine 37.5 MG capsule Take 1 capsule (37.5 mg total) by mouth every morning. 03/07/19   Biagio Borg, MD  promethazine (PHENERGAN) 25 MG tablet TAKE 1 TABLET (25 MG TOTAL) BY MOUTH EVERY 8 (EIGHT) HOURS AS NEEDED FOR NAUSEA OR VOMITING. 11/09/19   Biagio Borg, MD  traMADol (ULTRAM) 50 MG tablet Take 50 mg by mouth every 6 (six) hours as needed. 02/04/20   [provider]  traZODone (DESYREL) 50 MG tablet TAKE ONE-HALF TO ONE TABLET BY MOUTH  AT BEDTIME AS NEEDED FOR SLEEP 09/26/20   Biagio Borg, MD  zolpidem (AMBIEN) 10 MG tablet TAKE 1 TABLET BY MOUTH AT BEDTIME AS NEEDED FOR SLEEP. 09/28/20   Biagio Borg, MD  famotidine (PEPCID) 20 MG tablet Take 20 mg by mouth 2 (two) times daily. 08/15/18 01/12/20  [provider]    Family History Family History  Problem Relation Age of Onset   Anxiety disorder Father    Diabetes Father     Social History Social History   Tobacco Use   Smoking status: Every Day    Packs/day: 0.50    Types: Cigarettes   Smokeless tobacco: Never  Vaping Use   Vaping Use: Never used  Substance Use Topics   Alcohol use: Yes    Alcohol/week: 0.0 standard drinks    Comment: rarely   Drug use: No     Allergies   Prozac [fluoxetine hcl]   Review of Systems Review of Systems  HENT:  Positive for rhinorrhea.   Respiratory:  Positive for cough.   All other systems reviewed and are negative.   Physical Exam Triage Vital Signs ED Triage Vitals  Enc Vitals Group     BP 11/26/20 1900 (!) 167/93     Pulse Rate 11/26/20 1900 94     Resp 11/26/20 1900 16     Temp 11/26/20 1900 97.7 F (36.5 C)     Temp Source 11/26/20 1900 Oral     SpO2 11/26/20 1900 99 %     Weight --      Height --      Head Circumference --      Peak Flow --      Pain Score 11/26/20 1901 4     Pain Loc --      Pain Edu? --      Excl. in [place]? --    No data found.  Updated Vital Signs BP (!) 167/93 (BP Location: Left Arm)   Pulse 94   Temp 97.7 F (36.5 C) (Oral)   Resp 16   SpO2 99%   Visual Acuity Right Eye Distance:   Left Eye Distance:   Bilateral Distance:    Right Eye Near:   Left Eye Near:    Bilateral Near:     Physical Exam Vitals and nursing note reviewed.  Constitutional:      Appearance: She is well-developed.  HENT:     Head: Normocephalic.     Right Ear: Tympanic membrane normal.     Left Ear: Tympanic membrane normal.     Mouth/Throat:     Mouth: Mucous membranes are moist.   Cardiovascular:     Rate and Rhythm: Normal rate.  Pulmonary:     Effort: Pulmonary effort is normal.  Abdominal:     General: There is no distension.  Musculoskeletal:        General: Normal range of motion.     Cervical back: Normal range of motion.  Neurological:     Mental Status: She is alert and oriented to person, place, and time.     UC Treatments / Results  Labs (all labs ordered are listed, but only abnormal results are displayed) Labs Reviewed - No data to display  EKG   Radiology No results found.  Procedures Procedures (including critical care time)  Medications Ordered in UC Medications - No data to display  Initial Impression / Assessment and Plan / UC Course  I have reviewed the triage vital signs and the nursing notes.  Pertinent labs & imaging results that were available during my care of the patient were reviewed by me and considered in my medical decision making (see chart for details).     MDM:  Pt counseled on sinus infection and treatment Final Clinical Impressions(s) / UC Diagnoses   Final diagnoses:  Acute non-recurrent ethmoidal sinusitis     Discharge Instructions      Return if any problems.    ED Prescriptions     Medication Sig Dispense Auth. Provider   amoxicillin-clavulanate (AUGMENTIN) 875-125 MG tablet Take 1 tablet by mouth 2 (two) times daily for 10 days. 20 tablet Reco Shonk K, PA-C   fluconazole (DIFLUCAN) 150 MG tablet Take  1 tablet (150 mg total) by mouth daily. 1 tablet Elson Areas, [place]      PDMP not reviewed this encounter. An After Visit Summary was printed and given to the patient.    Elson Areas, [place] 11/30/20 1912

## 2020-12-07 ENCOUNTER — Other Ambulatory Visit: Payer: Self-pay | Admitting: Internal Medicine

## 2020-12-07 NOTE — Telephone Encounter (Signed)
Please refill as per office routine med refill policy (all routine meds to be refilled for 3 mo or monthly (per pt preference) up to one year from last visit, then month to month grace period for 3 mo, then further med refills will have to be denied) ? ?

## 2020-12-30 ENCOUNTER — Telehealth (INDEPENDENT_AMBULATORY_CARE_PROVIDER_SITE_OTHER): Payer: 59 | Admitting: Family Medicine

## 2020-12-30 DIAGNOSIS — U071 COVID-19: Secondary | ICD-10-CM | POA: Diagnosis not present

## 2020-12-30 DIAGNOSIS — R300 Dysuria: Secondary | ICD-10-CM | POA: Diagnosis not present

## 2020-12-30 MED ORDER — NITROFURANTOIN MONOHYD MACRO 100 MG PO CAPS: 100.0000 mg | ORAL_CAPSULE | Freq: Two times a day (BID) | ORAL | 0 refills | Status: DC

## 2020-12-30 MED ORDER — FLUCONAZOLE 150 MG PO TABS: 150.0000 mg | ORAL_TABLET | Freq: Every day | ORAL | 0 refills | Status: DC

## 2020-12-30 MED ORDER — MOLNUPIRAVIR EUA 200MG CAPSULE: 4.0000 | ORAL_CAPSULE | Freq: Two times a day (BID) | ORAL | 0 refills | Status: AC

## 2020-12-30 NOTE — Progress Notes (Signed)
Virtual Visit via Video Note  I connected with   on 12/30/20 at  4:20 PM EST by a video enabled telemedicine application and verified that I am speaking with the correct person using two identifiers.  Location patient: home, [place] Location provider:work or home office Persons participating in the virtual visit: patient, provider  I discussed the limitations of evaluation and management by telemedicine and the availability of in person appointments. The patient expressed understanding and agreed to proceed.   HPI:  Acute telemedicine visit for Covid19: -Onset: 3 days ago -Symptoms include: nasal congestion, cough, fever, body aches, diarrhea -Denies:CP, SOB, vomiting -drinking fluids, ate some today -Pertinent past medical history: see below, has had covid before -Pertinent medication allergies:  Allergies  Allergen Reactions   Prozac [Fluoxetine Hcl] Other (See Comments)  -COVID-19 vaccine status:  Immunization History  Administered Date(s) Administered   Influenza Whole 11/12/2006, 10/29/2009   Influenza,inj,Quad PF,6+ Mos 11/14/2012, 02/22/2018   Influenza-Unspecified 10/23/2018   Pneumococcal Polysaccharide-23 02/20/2013   Tdap 12/12/2012, 02/19/2017  -GFR in march was 8 -denies any chance of pregnancy, has IUD  Another issues - Dysuria -x1 week -frequency, urgency, burning urination -denies flank pain, hematuria, vaginal discharge -has history of UTI and reports this feels the same -she has been drinking water and used azo but did not resolved, requesting abx -also requesting diflucan as reports gets yeast infection when takes abx  ROS: See pertinent positives and negatives per HPI.  Past Medical History:  Diagnosis Date   ALLERGIC RHINITIS 10/30/2009   ANXIETY 09/04/2006   ASTHMA 12/25/2006   inhaler used 2 days ago   ASTHMA, WITH ACUTE EXACERBATION 05/03/2009   BLEPHARITIS, LEFT 05/21/2008   Cervical disc disease    2 bulging discs to neck    COMMON MIGRAINE  06/14/2007   GANGLION CYST, WRIST, LEFT 10/11/2007   GERD (gastroesophageal reflux disease) 04/24/2012   Headache(784.0) 10/23/2008   HYPERLIPIDEMIA 06/14/2007   LOW BACK PAIN 09/04/2006   Morbid obesity (HCC) 09/04/2006   OTITIS MEDIA, ACUTE, LEFT 03/27/2008   SINUSITIS- ACUTE-NOS 10/11/2007   TENOSYNOVITIS, WRIST 10/11/2007   URI 10/29/2009   URTICARIA 05/03/2009    Past Surgical History:  Procedure Laterality Date   back surgury  01/2003   s/p lumbar disc   CESAREAN SECTION N/A 02/19/2013   Procedure: CESAREAN SECTION;  Surgeon: Michael Litter, MD;  Location: [facility];  Service: Obstetrics;  Laterality: N/A;   LAPAROSCOPIC APPENDECTOMY N/A 12/09/2018   Procedure: APPENDECTOMY LAPAROSCOPIC;  Surgeon: Sung Amabile, DO;  Location: [facility];  Service: General;  Laterality: N/A;   LAPAROSCOPY  04/07/2011   Procedure: LAPAROSCOPY OPERATIVE;  Surgeon: Meriel Pica, MD;  Location: [facility];  Service: Gynecology;  Laterality: N/A;  left salpingogectomy     Current Outpatient Medications:    fluconazole (DIFLUCAN) 150 MG tablet, Take 1 tablet (150 mg total) by mouth daily., Disp: 1 tablet, Rfl: 0   molnupiravir EUA (LAGEVRIO) 200 mg CAPS capsule, Take 4 capsules (800 mg total) by mouth 2 (two) times daily for 5 days., Disp: 40 capsule, Rfl: 0   nitrofurantoin, macrocrystal-monohydrate, (MACROBID) 100 MG capsule, Take 1 capsule (100 mg total) by mouth 2 (two) times daily., Disp: 14 capsule, Rfl: 0   albuterol (VENTOLIN HFA) 108 (90 Base) MCG/ACT inhaler, INHALE 2 PUFFS INTO THE LUNGS EVERY 6 HOURS AS NEEDED, Disp: 25.5 each, Rfl: 2   ALPRAZolam (XANAX) 1 MG tablet, TAKE 1 TABLET BY MOUTH THREE TIMES A DAY AS NEEDED FOR ANXIETY, Disp:  90 tablet, Rfl: 2   cetirizine (ZYRTEC) 5 MG tablet, Take 10 mg by mouth 2 (two) times daily., Disp: , Rfl:    EPINEPHrine 0.3 mg/0.3 mL IJ SOAJ injection, Inject 0.3 mg into the muscle as needed., Disp: , Rfl:    fluticasone (FLONASE) 50 MCG/ACT nasal spray, Place 1-2  sprays into both nostrils daily., Disp: 16 g, Rfl: 0   Fluticasone-Salmeterol (ADVAIR) 100-50 MCG/DOSE AEPB, Inhale 1 puff into the lungs 2 (two) times daily. (Patient not taking: Reported on 03/22/2020), Disp: 3 each, Rfl: 3   hydroxychloroquine (PLAQUENIL) 200 MG tablet, Take 200 mg by mouth 2 (two) times daily., Disp: , Rfl:    influenza vac recom quadrivalent (FLUBLOK QUADRIVALENT) 0.5 ML injection, Flublok Quad 2020-2021 (PF) 180 mcg (45 mcg x 4)/0.5 mL IM syringe  PHARMACIST ADMINISTERED IMMUNIZATION ADMINISTERED AT TIME OF DISPENSING (Patient not taking: Reported on 03/22/2020), Disp: , Rfl:    levonorgestrel (MIRENA, 52 MG,) 20 MCG/24HR IUD, 52 mg by Intrauterine route as directed., Disp: , Rfl:    losartan (COZAAR) 25 MG tablet, Take 1 tablet (25 mg total) by mouth daily. Annual appt due in March must see provider for future refills, Disp: 90 tablet, Rfl: 0   meloxicam (MOBIC) 15 MG tablet, TAKE 1 TABLET BY MOUTH EVERY DAY AS NEEDED FOR PAIN, Disp: 90 tablet, Rfl: 1   naproxen (NAPROSYN) 500 MG tablet, Take 1 tablet (500 mg total) by mouth 2 (two) times daily., Disp: 30 tablet, Rfl: 0   omalizumab (XOLAIR) 150 MG/ML prefilled syringe, Inject 300 mg into the skin See admin instructions. INJECT 2 (150 MG) SYRINGES UNDER THE SKIN FOR A TOTAL DOSE OF 300MG  EVERY 4 WEEKS., Disp: , Rfl:    phenazopyridine (PYRIDIUM) 200 MG tablet, Take 1 tablet (200 mg total) by mouth 3 (three) times daily. (Patient not taking: Reported on 03/22/2020), Disp: 6 tablet, Rfl: 0   phentermine 37.5 MG capsule, Take 1 capsule (37.5 mg total) by mouth every morning., Disp: 30 capsule, Rfl: 2   promethazine (PHENERGAN) 25 MG tablet, TAKE 1 TABLET (25 MG TOTAL) BY MOUTH EVERY 8 (EIGHT) HOURS AS NEEDED FOR NAUSEA OR VOMITING., Disp: 20 tablet, Rfl: 1   traMADol (ULTRAM) 50 MG tablet, Take 50 mg by mouth every 6 (six) hours as needed., Disp: , Rfl:    traZODone (DESYREL) 50 MG tablet, TAKE ONE-HALF TO ONE TABLET BY MOUTH AT  BEDTIME AS NEEDED FOR SLEEP, Disp: 90 tablet, Rfl: 1   zolpidem (AMBIEN) 10 MG tablet, TAKE 1 TABLET BY MOUTH AT BEDTIME AS NEEDED FOR SLEEP., Disp: 90 tablet, Rfl: 1  EXAM:  VITALS per patient if applicable:  GENERAL: alert, oriented, appears well and in no acute distress  HEENT: atraumatic, conjunttiva clear, no obvious abnormalities on inspection of external nose and ears  NECK: normal movements of the head and neck  LUNGS: on inspection no signs of respiratory distress, breathing rate appears normal, no obvious gross SOB, gasping or wheezing  CV: no obvious cyanosis  MS: moves all visible extremities without noticeable abnormality  PSYCH/NEURO: pleasant and cooperative, no obvious depression or anxiety, speech and thought processing grossly intact  ASSESSMENT AND PLAN:  Discussed the following assessment and plan:  COVID-19  Discussed treatment options and risk of drug interactions, ideal treatment window, potential complications, isolation and precautions for COVID-19.  Discussed possibility of rebound with antivirals and the need to reisolate if it should occur for 5 days. Checked for/reviewed any labs done in the last 90 days  with GFR listed in HPI if available. After lengthy discussion, the patient opted for treatment with Legevrio due to being higher risk for complications of covid or severe disease and other factors. Discussed EUA status of this drug and the fact that there is preliminary limited knowledge of risks/interactions/side effects per EUA document vs possible benefits and precautions. This information was shared with patient during the visit and also was provided in patient instructions. The patient did want a prescription for cough, Tessalon Rx sent.  Other symptomatic care measures summarized in patient instructions.  Dysuria Query UTI vs other. She is requesting empiric treatment. Sent macrobid 100mg  bid x7 days. She reports hx of vaginitis with abx use and  requests diflucan this was also sent.   -we discussed possible serious and likely etiologies, options for evaluation and workup, limitations of telemedicine visit vs in person visit, treatment, treatment risks and precautions. Pt is agreeable to treatment via telemedicine at this moment. See above for problem oriented summary.   Advised to seek prompt in person care if worsening, new symptoms arise, or if is not improving with treatment. Discussed options for inperson care if PCP office not available. Did let this patient know that I only do telemedicine on Tuesdays and Thursdays for [place]. Advised to schedule follow up visit with PCP or [facility] if any further questions or concerns to avoid delays in care.   I discussed the assessment and treatment plan with the patient. The patient was provided an opportunity to ask questions and all were answered. The patient agreed with the plan and demonstrated an understanding of the instructions.     Lucretia Kern, DO

## 2020-12-30 NOTE — Patient Instructions (Addendum)
HOME CARE TIPS:   -I sent the medication(s) we discussed to your pharmacy: Meds ordered this encounter  Medications   molnupiravir EUA (LAGEVRIO) 200 mg CAPS capsule    Sig: Take 4 capsules (800 mg total) by mouth 2 (two) times daily for 5 days.    Dispense:  40 capsule    Refill:  0   nitrofurantoin, macrocrystal-monohydrate, (MACROBID) 100 MG capsule    Sig: Take 1 capsule (100 mg total) by mouth 2 (two) times daily.    Dispense:  14 capsule    Refill:  0   fluconazole (DIFLUCAN) 150 MG tablet    Sig: Take 1 tablet (150 mg total) by mouth daily.    Dispense:  1 tablet    Refill:  0     -I sent in the [place] treatment or referral you requested per our discussion. Please see the information provided below and discuss further with the pharmacist/treatment team.   -there is a chance of rebound illness after finishing your treatment. If you become sick again please isolate for an additional 5 days, plus 5 more days of masking.   -can use tylenol or aleve if needed for fevers, aches and pains per instructions  -can use nasal saline a few times per day if you have nasal congestion  -stay hydrated, drink plenty of fluids and eat small healthy meals - avoid dairy  -can take 1000 IU (74mg) Vit D3 and 100-500 mg of Vit C daily per instructions  -follow up with your doctor in 2-3 days unless improving and feeling better  -stay home while sick, except to seek medical care. If you have COVID19, you will likely be contagious for 7-10 days. Flu or Influenza is likely contagious for about 7 days. Other respiratory viral infections remain contagious for 5-10+ days depending on the virus and many other factors. Wear a good mask that fits snugly (such as N95 or KN95) if around others to reduce the risk of transmission.  It was nice to meet you today, and I really hope you are feeling better soon. I help [place] out with telemedicine visits on Tuesdays and Thursdays and am happy to help if  you need a follow up virtual visit on those days. Otherwise, if you have any concerns or questions following this visit please schedule a follow up visit with your Primary Care doctor or seek care at a local urgent care clinic to avoid delays in care.    Seek in person care or schedule a follow up video visit promptly if your symptoms worsen, new concerns arise or you are not improving with treatment. Call 911 and/or seek emergency care if your symptoms are severe or life threatening.     Fact Sheet for Patients And Caregivers Emergency Use Authorization (EUA) Of LAGEVRIO (molnupiravir) capsules For Coronavirus Disease 2019 (COVID-19)  What is the most important information I should know about LAGEVRIO? LAGEVRIO may cause serious side effects, including: ? LAGEVRIO may cause harm to your unborn baby. It is not known if LAGEVRIO will harm your baby if you take LAGEVRIO during pregnancy. o LAGEVRIO is not recommended for use in pregnancy. o LAGEVRIO has not been studied in pregnancy. LAGEVRIO was studied in pregnant animals only. When LAGEVRIO was given to pregnant animals, LAGEVRIO caused harm to their unborn babies. o You and your healthcare provider may decide that you should take LAGEVRIO during pregnancy if there are no other COVID-19 treatment options approved or authorized by the FDA that are accessible  or clinically appropriate for you. o If you and your healthcare provider decide that you should take LAGEVRIO during pregnancy, you and your healthcare provider should discuss the known and potential benefits and the potential risks of taking LAGEVRIO during pregnancy. For individuals who are able to become pregnant: ? You should use a reliable method of birth control (contraception) consistently and correctly during treatment with LAGEVRIO and for 4 days after the last dose of LAGEVRIO. Talk to your healthcare provider about reliable birth control methods. ? Before starting  treatment with [facility] your healthcare provider may do a pregnancy test to see if you are pregnant before starting treatment with LAGEVRIO. ? Tell your healthcare provider right away if you become pregnant or think you may be pregnant during treatment with LAGEVRIO. Pregnancy Surveillance Program: ? There is a pregnancy surveillance program for individuals who take LAGEVRIO during pregnancy. The purpose of this program is to collect information about the health of you and your baby. Talk to your healthcare provider about how to take part in this program. ? If you take LAGEVRIO during pregnancy and you agree to participate in the pregnancy surveillance program and allow your healthcare provider to share your information with [place], then your healthcare provider will report your use of [place] during pregnancy to [place]. by calling [phone] or [email or web address]. For individuals who are sexually active with partners who are able to become pregnant: ? It is not known if LAGEVRIO can affect sperm. While the risk is regarded as low, animal studies to fully assess the potential for LAGEVRIO to affect the babies of males treated with LAGEVRIO have not been completed. A reliable method of birth control (contraception) should be used consistently and correctly during treatment with LAGEVRIO and for at least 3 months after the last dose. The risk to sperm beyond 3 months is not known. Studies to understand the risk to sperm beyond 3 months are ongoing. Talk to your healthcare provider about reliable birth control methods. Talk to your healthcare provider if you have questions or concerns about how LAGEVRIO may affect sperm. You are being given this fact sheet because your healthcare provider believes it is necessary to provide you with LAGEVRIO for the treatment of adults with mild-to-moderate coronavirus disease 2019 (COVID-19) with positive results of  direct SARS-CoV-2 viral testing, and who are at high risk for progression to severe COVID-19 including hospitalization or death, and for whom other COVID-19 treatment options approved or authorized by the FDA are not accessible or clinically appropriate. The U.S. Food and Drug Administration (FDA) has issued an Emergency Use Authorization (EUA) to make LAGEVRIO available during the COVID-19 pandemic (for more details about an EUA please see What is an Emergency Use Authorization? at the end of this document). LAGEVRIO is not an FDA-approved medicine in the [place]. Read this Fact Sheet for information about LAGEVRIO. Talk to your healthcare provider about your options if you have any questions. It is your choice to take LAGEVRIO.  What is COVID-19? COVID-19 is caused by a virus called a coronavirus. You can get COVID-19 through close contact with another person who has the virus. COVID-19 illnesses have ranged from very mild-to-severe, including illness resulting in death. While information so far suggests that most COVID-19 illness is mild, serious illness can happen and may cause some of your other medical conditions to become worse. Older people and people of all ages with severe, long lasting (chronic) medical conditions like heart  disease, lung disease and diabetes, for example seem to be at higher risk of being hospitalized for COVID-19.  What is LAGEVRIO? LAGEVRIO is an investigational medicine used to treat mild-to-moderate COVID-19 in adults: ? with positive results of direct SARS-CoV-2 viral testing, and ? who are at high risk for progression to severe COVID-19 including hospitalization or death, and for whom other COVID-19 treatment options approved or authorized by the FDA are not accessible or clinically appropriate. The FDA has authorized the emergency use of LAGEVRIO for the treatment of mild-tomoderate COVID-19 in adults under an EUA. For more information on EUA,  see the What is an Emergency Use Authorization (EUA)? section at the end of this Fact Sheet. LAGEVRIO is not authorized: ? for use in people less than 11 years of age. ? for prevention of COVID-19. ? for people needing hospitalization for COVID-19. ? for use for longer than 5 consecutive days.  What should I tell my healthcare provider before I take LAGEVRIO? Tell your healthcare provider if you: ? Have any allergies ? Are breastfeeding or plan to breastfeed ? Have any serious illnesses ? Are taking any medicines (prescription, over-the-counter, vitamins, or herbal products).  How do I take LAGEVRIO? ? Take LAGEVRIO exactly as your healthcare provider tells you to take it. ? Take 4 capsules of LAGEVRIO every 12 hours (for example, at 8 am and at 8 pm) ? Take LAGEVRIO for 5 days. It is important that you complete the full 5 days of treatment with LAGEVRIO. Do not stop taking LAGEVRIO before you complete the full 5 days of treatment, even if you feel better. ? Take LAGEVRIO with or without food. ? You should stay in isolation for as long as your healthcare provider tells you to. Talk to your healthcare provider if you are not sure about how to properly isolate while you have COVID-19. ? Swallow LAGEVRIO capsules whole. Do not open, break, or crush the capsules. If you cannot swallow capsules whole, tell your healthcare provider. ? What to do if you miss a dose: o If it has been less than 10 hours since the missed dose, take it as soon as you remember o If it has been more than 10 hours since the missed dose, skip the missed dose and take your dose at the next scheduled time. ? Do not double the dose of LAGEVRIO to make up for a missed dose.  What are the important possible side effects of LAGEVRIO? ? See, What is the most important information I should know about LAGEVRIO? ? Allergic Reactions. Allergic reactions can happen in people taking LAGEVRIO, even after only 1 dose.  Stop taking LAGEVRIO and call your healthcare provider right away if you get any of the following symptoms of an allergic reaction: o hives o rapid heartbeat o trouble swallowing or breathing o swelling of the mouth, lips, or face o throat tightness o hoarseness o skin rash The most common side effects of LAGEVRIO are: ? diarrhea ? nausea ? dizziness These are not all the possible side effects of LAGEVRIO. Not many people have taken LAGEVRIO. Serious and unexpected side effects may happen. This medicine is still being studied, so it is possible that all of the risks are not known at this time.  What other treatment choices are there?  Veklury (remdesivir) is FDA-approved as an intravenous (IV) infusion for the treatment of mildto-moderate [id number]-94 in certain adults and children. Talk with your doctor to see if [place] is appropriate for you. Like  LAGEVRIO, FDA may also allow for the emergency use of other medicines to treat people with COVID-19. Go to LacrosseProperties.si for more information. It is your choice to be treated or not to be treated with LAGEVRIO. Should you decide not to take it, it will not change your standard medical care.  What if I am breastfeeding? Breastfeeding is not recommended during treatment with LAGEVRIO and for 4 days after the last dose of LAGEVRIO. If you are breastfeeding or plan to breastfeed, talk to your healthcare provider about your options and specific situation before taking LAGEVRIO.  How do I report side effects with LAGEVRIO? Contact your healthcare provider if you have any side effects that bother you or do not go away. Report side effects to FDA MedWatch at SmoothHits.hu or call 1-800-FDA-1088 (1- 530-313-5690).  How should I store [place]? ? Store LAGEVRIO capsules at room temperature between 53F to 36F (20C to 25C). ?  Keep LAGEVRIO and all medicines out of the reach of children and pets. How can I learn more about COVID-19? ? Ask your healthcare provider. ? Visit SeekRooms.co.uk ? Contact your local or state public health department. ? Call [place] at [phone] (toll free in the [place] ? Visit www.molnupiravir.com  What Is an Emergency Use Authorization (EUA)? The [place] FDA has made [place] available under an emergency access mechanism called an Emergency Use Authorization (EUA) The EUA is supported by a Present[place], broadcasting Health and Human Service (HHS) declaration that circumstances exist to justify emergency use of drugs and biological products during the COVID-19 pandemic. LAGEVRIO for the treatment of mild-to-moderate COVID-19 in adults with positive results of direct SARS-CoV-2 viral testing, who are at high risk for progression to severe COVID-19, including hospitalization or death, and for whom alternative COVID-19 treatment options approved or authorized by FDA are not accessible or clinically appropriate, has not undergone the same type of review as an FDA-approved product. In issuing an EUA under the OXBDZ-32 public health emergency, the FDA has determined, among other things, that based on the total amount of scientific evidence available including data from adequate and well-controlled clinical trials, if available, it is reasonable to believe that the product may be effective for diagnosing, treating, or preventing COVID-19, or a serious or life-threatening disease or condition caused by COVID-19; that the known and potential benefits of the product, when used to diagnose, treat, or prevent such disease or condition, outweigh the known and potential risks of such product; and that there are no adequate, approved, and available alternatives.  All of these criteria must be met to allow for the product to be used in the treatment of patients during the COVID-19 pandemic.  The EUA for LAGEVRIO is in effect for the duration of the COVID-19 declaration justifying emergency use of LAGEVRIO, unless terminated or revoked (after which LAGEVRIO may no longer be used under the EUA). For patent information: http://rogers.info/ Copyright  2021-2022 [place], [place] and its affiliates. All rights reserved. [id number] Revised: March 2022

## 2021-01-12 ENCOUNTER — Other Ambulatory Visit: Payer: Self-pay | Admitting: Internal Medicine

## 2021-01-17 ENCOUNTER — Encounter: Payer: Self-pay | Admitting: Internal Medicine

## 2021-01-18 ENCOUNTER — Other Ambulatory Visit: Payer: Self-pay

## 2021-01-18 ENCOUNTER — Encounter: Payer: Self-pay | Admitting: Emergency Medicine

## 2021-01-18 ENCOUNTER — Ambulatory Visit
Admission: EM | Admit: 2021-01-18 | Discharge: 2021-01-18 | Disposition: A | Discharge status: Home / Self Care | Payer: 59 | Attending: Emergency Medicine | Admitting: Emergency Medicine

## 2021-01-18 DIAGNOSIS — I10 Essential (primary) hypertension: Secondary | ICD-10-CM

## 2021-01-18 DIAGNOSIS — N898 Other specified noninflammatory disorders of vagina: Secondary | ICD-10-CM | POA: Diagnosis not present

## 2021-01-18 DIAGNOSIS — J069 Acute upper respiratory infection, unspecified: Secondary | ICD-10-CM | POA: Diagnosis not present

## 2021-01-18 MED ORDER — FLUCONAZOLE 150 MG PO TABS: 150.0000 mg | ORAL_TABLET | Freq: Every day | ORAL | 0 refills | Status: DC

## 2021-01-18 NOTE — ED Provider Notes (Signed)
[name]    CSN: [id number] Arrival date & time: 01/18/21  1315      History   Chief Complaint Chief Complaint  Patient presents with   Nasal Congestion   Headache   Chest Congestion    HPI Martha Thompson is a 36 y.o. female.  Patient presents with 4-5 day history of runny nose, nasal congestion, sinus pressure.  No fever, rash, cough, shortness of breath, vomiting, diarrhea.  Treatment at home ibuprofen and Mucinex.  She was seen by video visit and diagnosed with COVID-19 on 12/30/2020; treated with molnupiravir;  she tested positive at [facility].  She was also treated at that visit for a UTI with Macrobid.  Her URI symptoms improved and then returned recently but are less severe.    Patient also reports thick white vaginal discharge with mild vaginal itching.  She states this is similar to previous yeast infection and she requests Diflucan.  No other symptoms. Her medical history includes asthma, morbid obesity, hypertension, migraine headaches.  The history is provided by the patient and medical records.   Past Medical History:  Diagnosis Date   ALLERGIC RHINITIS 10/30/2009   ANXIETY 09/04/2006   ASTHMA 12/25/2006   inhaler used 2 days ago   ASTHMA, WITH ACUTE EXACERBATION 05/03/2009   BLEPHARITIS, LEFT 05/21/2008   Cervical disc disease    2 bulging discs to neck    COMMON MIGRAINE 06/14/2007   GANGLION CYST, WRIST, LEFT 10/11/2007   GERD (gastroesophageal reflux disease) 04/24/2012   Headache(784.0) 10/23/2008   HYPERLIPIDEMIA 06/14/2007   LOW BACK PAIN 09/04/2006   Morbid obesity ([place]) 09/04/2006   OTITIS MEDIA, ACUTE, LEFT 03/27/2008   SINUSITIS- ACUTE-NOS 10/11/2007   TENOSYNOVITIS, WRIST 10/11/2007   URI 10/29/2009   URTICARIA 05/03/2009    Patient Active Problem List   Diagnosis Date Noted   Vitamin D deficiency 03/22/2020   Asthma exacerbation 03/07/2019   Acute appendicitis 12/09/2018   STD exposure 02/19/2017   Cough 08/08/2016   Chronic pain of  left knee 08/08/2016   Insomnia 02/08/2016   Acute left lumbar radiculopathy 09/16/2015   Hyperglycemia 08/13/2015   GERD (gastroesophageal reflux disease) 04/24/2012   Wheezing 04/17/2011   Encounter for well adult exam with abnormal findings 04/13/2010   Allergic rhinitis 10/30/2009   Acute upper respiratory infection 10/29/2009   [place] DISEASE, CERVICAL 10/29/2009   Hives 05/03/2009   Essential hypertension 10/11/2007   HLD (hyperlipidemia) 06/14/2007   Asthma 12/25/2006   Morbid obesity ([place]) 09/04/2006   Generalized anxiety disorder 09/04/2006   Anxiety with depression 09/04/2006   LOW BACK PAIN 09/04/2006    Past Surgical History:  Procedure Laterality Date   back surgury  01/2003   s/p lumbar disc   CESAREAN SECTION N/A 02/19/2013   Procedure: CESAREAN SECTION;  Surgeon: Betsy Coder, MD;  Location: [facility];  Service: Obstetrics;  Laterality: N/A;   LAPAROSCOPIC APPENDECTOMY N/A 12/09/2018   Procedure: APPENDECTOMY LAPAROSCOPIC;  Surgeon: Benjamine Sprague, DO;  Location: [facility];  Service: General;  Laterality: N/A;   LAPAROSCOPY  04/07/2011   Procedure: LAPAROSCOPY OPERATIVE;  Surgeon: Margarette Asal, MD;  Location: [facility];  Service: Gynecology;  Laterality: N/A;  left salpingogectomy    OB History     Gravida  3   Para  1   Term  1   Preterm      AB  2   Living  1      SAB  1   IAB  Ectopic  1   Multiple      Live Births  1            Home Medications    Prior to Admission medications   Medication Sig Start Date End Date Taking? Authorizing Provider  fluconazole (DIFLUCAN) 150 MG tablet Take 1 tablet (150 mg total) by mouth daily. Take one tablet today.  May repeat in 3 days. 01/18/21  Yes Sharion Balloon, NP  albuterol (VENTOLIN HFA) 108 (90 Base) MCG/ACT inhaler INHALE 2 PUFFS INTO THE LUNGS EVERY 6 HOURS AS NEEDED 10/13/20   Biagio Borg, MD  ALPRAZolam Duanne Moron) 1 MG tablet TAKE 1 TABLET BY MOUTH THREE TIMES A DAY AS NEEDED FOR ANXIETY  01/12/21   Biagio Borg, MD  cetirizine (ZYRTEC) 5 MG tablet Take 10 mg by mouth 2 (two) times daily.    [provider]  EPINEPHrine 0.3 mg/0.3 mL IJ SOAJ injection Inject 0.3 mg into the muscle as needed. 07/25/18   [provider]  fluticasone (FLONASE) 50 MCG/ACT nasal spray Place 1-2 sprays into both nostrils daily. 01/12/20   Wieters, Hallie C, PA-C  Fluticasone-Salmeterol (ADVAIR) 100-50 MCG/DOSE AEPB Inhale 1 puff into the lungs 2 (two) times daily. Patient not taking: Reported on 03/22/2020 03/07/19   Biagio Borg, MD  hydroxychloroquine (PLAQUENIL) 200 MG tablet Take 200 mg by mouth 2 (two) times daily. 07/28/18   [provider]  influenza vac recom quadrivalent (FLUBLOK QUADRIVALENT) 0.5 ML injection Flublok Quad 2020-2021 (PF) 180 mcg (45 mcg x 4)/0.5 mL IM syringe  PHARMACIST ADMINISTERED IMMUNIZATION ADMINISTERED AT TIME OF DISPENSING Patient not taking: Reported on 03/22/2020    [provider]  levonorgestrel (MIRENA, 52 MG,) 20 MCG/24HR IUD 52 mg by Intrauterine route as directed.    [provider]  losartan (COZAAR) 25 MG tablet Take 1 tablet (25 mg total) by mouth daily. Annual appt due in March must see provider for future refills 12/07/20   Biagio Borg, MD  meloxicam (MOBIC) 15 MG tablet TAKE 1 TABLET BY MOUTH EVERY DAY AS NEEDED FOR PAIN 09/10/20   Biagio Borg, MD  naproxen (NAPROSYN) 500 MG tablet Take 1 tablet (500 mg total) by mouth 2 (two) times daily. 01/12/20   Wieters, Hallie C, PA-C  nitrofurantoin, macrocrystal-monohydrate, (MACROBID) 100 MG capsule Take 1 capsule (100 mg total) by mouth 2 (two) times daily. 12/30/20   Lucretia Kern, DO  omalizumab Arvid Right) 150 MG/ML prefilled syringe Inject 300 mg into the skin See admin instructions. INJECT 2 (150 MG) SYRINGES UNDER THE SKIN FOR A TOTAL DOSE OF 300MG  EVERY 4 WEEKS. 06/17/18   [provider]  phenazopyridine (PYRIDIUM) 200 MG tablet Take 1 tablet (200 mg total) by mouth 3  (three) times daily. Patient not taking: Reported on 03/22/2020 11/28/19   Ok Edwards, PA-C  phentermine 37.5 MG capsule Take 1 capsule (37.5 mg total) by mouth every morning. 03/07/19   Biagio Borg, MD  promethazine (PHENERGAN) 25 MG tablet TAKE 1 TABLET (25 MG TOTAL) BY MOUTH EVERY 8 (EIGHT) HOURS AS NEEDED FOR NAUSEA OR VOMITING. 11/09/19   Biagio Borg, MD  traMADol (ULTRAM) 50 MG tablet Take 50 mg by mouth every 6 (six) hours as needed. 02/04/20   [provider]  traZODone (DESYREL) 50 MG tablet TAKE ONE-HALF TO ONE TABLET BY MOUTH AT BEDTIME AS NEEDED FOR SLEEP 09/26/20   Biagio Borg, MD  zolpidem (AMBIEN) 10 MG tablet TAKE 1  TABLET BY MOUTH AT BEDTIME AS NEEDED FOR SLEEP. 09/28/20   Corwin Levins, MD  famotidine (PEPCID) 20 MG tablet Take 20 mg by mouth 2 (two) times daily. 08/15/18 01/12/20  [provider]    Family History Family History  Problem Relation Age of Onset   Anxiety disorder Father    Diabetes Father     Social History Social History   Tobacco Use   Smoking status: Every Day    Packs/day: 0.50    Types: Cigarettes   Smokeless tobacco: Never  Vaping Use   Vaping Use: Never used  Substance Use Topics   Alcohol use: Yes    Alcohol/week: 0.0 standard drinks    Comment: rarely   Drug use: No     Allergies   Prozac [fluoxetine hcl]   Review of Systems Review of Systems  Constitutional:  Negative for chills and fever.  HENT:  Positive for congestion, rhinorrhea and sinus pressure. Negative for ear pain and sore throat.   Respiratory:  Positive for cough. Negative for shortness of breath.   Cardiovascular:  Negative for chest pain and palpitations.  Gastrointestinal:  Negative for diarrhea and vomiting.  Genitourinary:  Positive for vaginal discharge. Negative for dysuria, flank pain, frequency and pelvic pain.  Skin:  Negative for color change and rash.  All other systems reviewed and are negative.   Physical Exam Triage Vital Signs ED  Triage Vitals [01/18/21 1330]  Enc Vitals Group     BP (!) 158/89     Pulse Rate (!) 101     Resp 18     Temp 98 F (36.7 C)     Temp Source Oral     SpO2 95 %     Weight      Height      Head Circumference      Peak Flow      Pain Score      Pain Loc      Pain Edu?      Excl. in GC?    No data found.  Updated Vital Signs BP (!) 158/89 (BP Location: Left Arm)    Pulse (!) 101    Temp 98 F (36.7 C) (Oral)    Resp 18    SpO2 95%   Visual Acuity Right Eye Distance:   Left Eye Distance:   Bilateral Distance:    Right Eye Near:   Left Eye Near:    Bilateral Near:     Physical Exam Vitals and nursing note reviewed.  Constitutional:      General: She is not in acute distress.    Appearance: She is well-developed. She is obese.  HENT:     Right Ear: Tympanic membrane normal.     Left Ear: Tympanic membrane normal.     Nose: Congestion and rhinorrhea present.     Mouth/Throat:     Mouth: Mucous membranes are moist.     Pharynx: Oropharynx is clear.  Cardiovascular:     Rate and Rhythm: Normal rate and regular rhythm.     Heart sounds: Normal heart sounds.  Pulmonary:     Effort: Pulmonary effort is normal. No respiratory distress.     Breath sounds: Normal breath sounds.  Abdominal:     Palpations: Abdomen is soft.     Tenderness: There is no abdominal tenderness.  Musculoskeletal:     Cervical back: Neck supple.  Skin:    General: Skin is warm and dry.  Neurological:  Mental Status: She is alert.  Psychiatric:        Mood and Affect: Mood normal.        Behavior: Behavior normal.     UC Treatments / Results  Labs (all labs ordered are listed, but only abnormal results are displayed) Labs Reviewed - No data to display  EKG   Radiology No results found.  Procedures Procedures (including critical care time)  Medications Ordered in UC Medications - No data to display  Initial Impression / Assessment and Plan / UC Course  I have reviewed the  triage vital signs and the nursing notes.  Pertinent labs & imaging results that were available during my care of the patient were reviewed by me and considered in my medical decision making (see chart for details).    Viral URI. Elevated blood pressure with HTN, vaginal discharge.  Patient was recently treated with molnupiravir for [place] and [place] for UTI.   Discussed continued symptomatic treatment with plain Mucinex and ibuprofen.  Instructed patient to follow-up with her PCP if her symptoms are not improving.  Discussed that her blood pressure is elevated today and needs to be rechecked by her PCP in 2 to 4 weeks.  Education provided on managing hypertension.  Per patient request and report of vaginal discharge, treating with one dose of Diflucan.  Instructed her to follow up with her PCP or gynecologist if not improving.  She agrees to plan of care.     Final Clinical Impressions(s) / UC Diagnoses   Final diagnoses:  Viral URI  Elevated blood pressure reading in office with diagnosis of hypertension  Vaginal discharge     Discharge Instructions      Continue symptomatic treatment.  Follow up with your primary care provider if your symptoms are not improving.    Your blood pressure is elevated today at 158/89.  Please have this rechecked by your primary care provider in 2-4 weeks.          ED Prescriptions     Medication Sig Dispense Auth. Provider   fluconazole (DIFLUCAN) 150 MG tablet Take 1 tablet (150 mg total) by mouth daily. Take one tablet today.  May repeat in 3 days. 1 tablet Sharion Balloon, NP      PDMP not reviewed this encounter.   Sharion Balloon, NP 01/18/21 1424

## 2021-01-18 NOTE — Discharge Instructions (Addendum)
Continue symptomatic treatment.  Follow up with your primary care provider if your symptoms are not improving.    Your blood pressure is elevated today at 158/89.  Please have this rechecked by your primary care provider in 2-4 weeks.

## 2021-01-18 NOTE — Telephone Encounter (Signed)
Fax received last week and faxed. Patient notified.

## 2021-01-18 NOTE — ED Triage Notes (Addendum)
Pt here with nasal congestion, sinus pressure, headache and chest congestion x 1 week. Had COVID the week of Christmas, took [place], felt better for about a week, and now presents with these sx. No fevers.

## 2021-01-19 ENCOUNTER — Encounter: Payer: Self-pay | Admitting: Internal Medicine

## 2021-02-04 ENCOUNTER — Ambulatory Visit: Payer: 59 | Admitting: Internal Medicine

## 2021-02-04 ENCOUNTER — Other Ambulatory Visit: Payer: Self-pay

## 2021-02-04 DIAGNOSIS — R1011 Right upper quadrant pain: Secondary | ICD-10-CM

## 2021-02-04 DIAGNOSIS — E559 Vitamin D deficiency, unspecified: Secondary | ICD-10-CM

## 2021-02-04 DIAGNOSIS — R739 Hyperglycemia, unspecified: Secondary | ICD-10-CM

## 2021-02-04 DIAGNOSIS — G4710 Hypersomnia, unspecified: Secondary | ICD-10-CM | POA: Diagnosis not present

## 2021-02-04 DIAGNOSIS — K219 Gastro-esophageal reflux disease without esophagitis: Secondary | ICD-10-CM | POA: Diagnosis not present

## 2021-02-04 LAB — CBC WITH DIFFERENTIAL/PLATELET
Basophils Absolute: 0 10*3/uL (ref 0.0–0.1)
Basophils Relative: 0.4 % (ref 0.0–3.0)
Eosinophils Absolute: 0.2 10*3/uL (ref 0.0–0.7)
Eosinophils Relative: 2.5 % (ref 0.0–5.0)
HCT: 41.6 % (ref 36.0–46.0)
Hemoglobin: 14 g/dL (ref 12.0–15.0)
Lymphocytes Relative: 28.4 % (ref 12.0–46.0)
Lymphs Abs: 2.7 10*3/uL (ref 0.7–4.0)
MCHC: 33.7 g/dL (ref 30.0–36.0)
MCV: 91.9 fl (ref 78.0–100.0)
Monocytes Absolute: 0.7 10*3/uL (ref 0.1–1.0)
Monocytes Relative: 7.4 % (ref 3.0–12.0)
Neutro Abs: 5.9 10*3/uL (ref 1.4–7.7)
Neutrophils Relative %: 61.3 % (ref 43.0–77.0)
Platelets: 236 10*3/uL (ref 150.0–400.0)
RBC: 4.52 Mil/uL (ref 3.87–5.11)
RDW: 13.2 % (ref 11.5–15.5)
WBC: 9.7 10*3/uL (ref 4.0–10.5)

## 2021-02-04 LAB — HEPATIC FUNCTION PANEL
ALT: 24 U/L (ref 0–35)
AST: 17 U/L (ref 0–37)
Albumin: 4.4 g/dL (ref 3.5–5.2)
Alkaline Phosphatase: 49 U/L (ref 39–117)
Bilirubin, Direct: 0.1 mg/dL (ref 0.0–0.3)
Total Bilirubin: 0.5 mg/dL (ref 0.2–1.2)
Total Protein: 7.8 g/dL (ref 6.0–8.3)

## 2021-02-04 LAB — BASIC METABOLIC PANEL
BUN: 16 mg/dL (ref 6–23)
CO2: 31 mEq/L (ref 19–32)
Calcium: 9.6 mg/dL (ref 8.4–10.5)
Chloride: 102 mEq/L (ref 96–112)
Creatinine, Ser: 0.65 mg/dL (ref 0.40–1.20)
GFR: 114.69 mL/min (ref >60.00)
Glucose, Bld: 87 mg/dL (ref 70–99)
Potassium: 4.1 mEq/L (ref 3.5–5.1)
Sodium: 138 mEq/L (ref 135–145)

## 2021-02-04 LAB — LIPASE: Lipase: 30 U/L (ref 11.0–59.0)

## 2021-02-04 MED ORDER — SEMAGLUTIDE-WEIGHT MANAGEMENT 2.4 MG/0.75ML ~~LOC~~ SOAJ: 2.4000 mg | SUBCUTANEOUS | 0 refills | Status: AC

## 2021-02-04 MED ORDER — TRIAMCINOLONE ACETONIDE 55 MCG/ACT NA AERO: 2.0000 | INHALATION_SPRAY | Freq: Every day | NASAL | 12 refills | Status: DC

## 2021-02-04 MED ORDER — PANTOPRAZOLE SODIUM 40 MG PO TBEC: 40.0000 mg | DELAYED_RELEASE_TABLET | Freq: Every day | ORAL | 3 refills | Status: DC

## 2021-02-04 MED ORDER — SEMAGLUTIDE-WEIGHT MANAGEMENT 1 MG/0.5ML ~~LOC~~ SOAJ: 1.0000 mg | SUBCUTANEOUS | 0 refills | Status: DC

## 2021-02-04 MED ORDER — SEMAGLUTIDE-WEIGHT MANAGEMENT 0.25 MG/0.5ML ~~LOC~~ SOAJ: 0.2500 mg | SUBCUTANEOUS | 0 refills | Status: AC

## 2021-02-04 MED ORDER — SEMAGLUTIDE-WEIGHT MANAGEMENT 1.7 MG/0.75ML ~~LOC~~ SOAJ: 1.7000 mg | SUBCUTANEOUS | 0 refills | Status: AC

## 2021-02-04 NOTE — Assessment & Plan Note (Signed)
Worsening, ok for referal medical wt manageement, and wegovy if ok with insurance

## 2021-02-04 NOTE — Assessment & Plan Note (Signed)
Ok for referal pulmonary, likely OSA related it seems

## 2021-02-04 NOTE — Progress Notes (Signed)
Patient ID: Martha Thompson, female   DOB: 06/04/1985, 36 y.o.   MRN: [id number]        Chief Complaint: follow up obesity, ruq pain, reflux, allergies and nosebleed, hyeprsomnolence       HPI:  Martha Thompson is a 36 y.o. female here with worsening now near massive super morbid obesity, hoping for wegovy start since a friend has had some wt loss with this. Has constantly gaining wt despite trying for increased activity, less calories.  Pt denies chest pain, increased sob or doe, wheezing, orthopnea, PND, increased LE swelling, palpitations, dizziness or syncope.  Denies worsening dysphagia, n/v, bowel change or blood, except for 1-2 wks intemittent RUQ pain sharp, mild, intermittent, worse to bend forward at the waist or twist nothing else makes better or worse.  Reflux has also become much worse on otc prilosec.   Also with c/o worsening hypersomnolence and can go to sleep any time, not sure about snoring or breathing as sleeps alone.        Wt Readings from Last 3 Encounters:  02/04/21 (!) 359 lb (162.8 kg)  03/22/20 (!) 314 lb (142.4 kg)  01/12/20 300 lb (136.1 kg)   BP Readings from Last 3 Encounters:  02/04/21 140/86  01/18/21 (!) 158/89  11/26/20 (!) 167/93         Past Medical History:  Diagnosis Date   ALLERGIC RHINITIS 10/30/2009   ANXIETY 09/04/2006   ASTHMA 12/25/2006   inhaler used 2 days ago   ASTHMA, WITH ACUTE EXACERBATION 05/03/2009   BLEPHARITIS, LEFT 05/21/2008   Cervical disc disease    2 bulging discs to neck    COMMON MIGRAINE 06/14/2007   GANGLION CYST, WRIST, LEFT 10/11/2007   GERD (gastroesophageal reflux disease) 04/24/2012   Headache(784.0) 10/23/2008   HYPERLIPIDEMIA 06/14/2007   LOW BACK PAIN 09/04/2006   Morbid obesity ([place]) 09/04/2006   OTITIS MEDIA, ACUTE, LEFT 03/27/2008   SINUSITIS- ACUTE-NOS 10/11/2007   TENOSYNOVITIS, WRIST 10/11/2007   URI 10/29/2009   URTICARIA 05/03/2009   Past Surgical History:  Procedure Laterality Date   back surgury   01/2003   s/p lumbar disc   CESAREAN SECTION N/A 02/19/2013   Procedure: CESAREAN SECTION;  Surgeon: Betsy Coder, MD;  Location: [facility];  Service: Obstetrics;  Laterality: N/A;   LAPAROSCOPIC APPENDECTOMY N/A 12/09/2018   Procedure: APPENDECTOMY LAPAROSCOPIC;  Surgeon: Benjamine Sprague, DO;  Location: [facility];  Service: General;  Laterality: N/A;   LAPAROSCOPY  04/07/2011   Procedure: LAPAROSCOPY OPERATIVE;  Surgeon: Margarette Asal, MD;  Location: [facility];  Service: Gynecology;  Laterality: N/A;  left salpingogectomy    reports that she has been smoking cigarettes. She has been smoking an average of .5 packs per day. She has never used smokeless tobacco. She reports current alcohol use. She reports that she does not use drugs. family history includes Anxiety disorder in her father; Diabetes in her father. Allergies  Allergen Reactions   Prozac [Fluoxetine Hcl] Other (See Comments)   Current Outpatient Medications on File Prior to Visit  Medication Sig Dispense Refill   albuterol (VENTOLIN HFA) 108 (90 Base) MCG/ACT inhaler INHALE 2 PUFFS INTO THE LUNGS EVERY 6 HOURS AS NEEDED 25.5 each 2   ALPRAZolam (XANAX) 1 MG tablet TAKE 1 TABLET BY MOUTH THREE TIMES A DAY AS NEEDED FOR ANXIETY 90 tablet 2   cetirizine (ZYRTEC) 5 MG tablet Take 10 mg by mouth 2 (two) times daily.     EPINEPHrine 0.3 mg/0.3 mL  IJ SOAJ injection Inject 0.3 mg into the muscle as needed.     fluconazole (DIFLUCAN) 150 MG tablet Take 1 tablet (150 mg total) by mouth daily. Take one tablet today.  May repeat in 3 days. 1 tablet 0   fluticasone (FLONASE) 50 MCG/ACT nasal spray Place 1-2 sprays into both nostrils daily. 16 g 0   Fluticasone-Salmeterol (ADVAIR) 100-50 MCG/DOSE AEPB Inhale 1 puff into the lungs 2 (two) times daily. 3 each 3   hydroxychloroquine (PLAQUENIL) 200 MG tablet Take 200 mg by mouth 2 (two) times daily.     influenza vac recom quadrivalent (FLUBLOK QUADRIVALENT) 0.5 ML injection      levonorgestrel  (MIRENA, 52 MG,) 20 MCG/24HR IUD 52 mg by Intrauterine route as directed.     losartan (COZAAR) 25 MG tablet Take 1 tablet (25 mg total) by mouth daily. Annual appt due in March must see provider for future refills 90 tablet 0   meloxicam (MOBIC) 15 MG tablet TAKE 1 TABLET BY MOUTH EVERY DAY AS NEEDED FOR PAIN 90 tablet 1   naproxen (NAPROSYN) 500 MG tablet Take 1 tablet (500 mg total) by mouth 2 (two) times daily. 30 tablet 0   omalizumab (XOLAIR) 150 MG/ML prefilled syringe Inject 300 mg into the skin See admin instructions. INJECT 2 (150 MG) SYRINGES UNDER THE SKIN FOR A TOTAL DOSE OF 300MG  EVERY 4 WEEKS.     phenazopyridine (PYRIDIUM) 200 MG tablet Take 1 tablet (200 mg total) by mouth 3 (three) times daily. 6 tablet 0   promethazine (PHENERGAN) 25 MG tablet TAKE 1 TABLET (25 MG TOTAL) BY MOUTH EVERY 8 (EIGHT) HOURS AS NEEDED FOR NAUSEA OR VOMITING. 20 tablet 1   traMADol (ULTRAM) 50 MG tablet Take 50 mg by mouth every 6 (six) hours as needed.     zolpidem (AMBIEN) 10 MG tablet TAKE 1 TABLET BY MOUTH AT BEDTIME AS NEEDED FOR SLEEP. 90 tablet 1   [DISCONTINUED] famotidine (PEPCID) 20 MG tablet Take 20 mg by mouth 2 (two) times daily.     No current facility-administered medications on file prior to visit.        ROS:  All others reviewed and negative.  Objective        PE:  BP 140/86    Pulse 98    Ht 5\' 11"  (1.803 m)    Wt (!) 359 lb (162.8 kg)    SpO2 99%    BMI 50.07 kg/m                 Constitutional: Pt appears in NAD               HENT: Head: NCAT.                Right Ear: External ear normal.                 Left Ear: External ear normal.                Eyes: . Pupils are equal, round, and reactive to light. Conjunctivae and EOM are normal               Nose: without d/c or deformity               Neck: Neck supple. Gross normal ROM               Cardiovascular: Normal rate and regular rhythm.  Pulmonary/Chest: Effort normal and breath sounds without rales or  wheezing.                Abd:  Soft, NT, ND, + BS, no organomegaly               Neurological: Pt is alert. At baseline orientation, motor grossly intact               Skin: Skin is warm. No rashes, no other new lesions, LE edema - none               Psychiatric: Pt behavior is normal without agitation   Micro: none  Cardiac tracings I have personally interpreted today:  none  Pertinent Radiological findings (summarize): none   Lab Results  Component Value Date   WBC 9.7 02/04/2021   HGB 14.0 02/04/2021   HCT 41.6 02/04/2021   PLT 236.0 02/04/2021   GLUCOSE 87 02/04/2021   CHOL 143 03/22/2020   TRIG 133.0 03/22/2020   HDL 39.40 03/22/2020   LDLDIRECT 113.0 02/22/2018   LDLCALC 77 03/22/2020   ALT 24 02/04/2021   AST 17 02/04/2021   NA 138 02/04/2021   K 4.1 02/04/2021   CL 102 02/04/2021   CREATININE 0.65 02/04/2021   BUN 16 02/04/2021   CO2 31 02/04/2021   TSH 1.00 03/22/2020   HGBA1C 5.3 03/22/2020   Assessment/Plan:  MIRYAH DELGATTO is a 37 y.o. White or Caucasian [1] female with  has a past medical history of ALLERGIC RHINITIS (10/30/2009), ANXIETY (09/04/2006), ASTHMA (12/25/2006), ASTHMA, WITH ACUTE EXACERBATION (05/03/2009), BLEPHARITIS, LEFT (05/21/2008), Cervical disc disease, COMMON MIGRAINE (06/14/2007), GANGLION CYST, WRIST, LEFT (10/11/2007), GERD (gastroesophageal reflux disease) (04/24/2012), Headache(784.0) (10/23/2008), HYPERLIPIDEMIA (06/14/2007), LOW BACK PAIN (09/04/2006), Morbid obesity ([place]) (09/04/2006), OTITIS MEDIA, ACUTE, LEFT (03/27/2008), SINUSITIS- ACUTE-NOS (10/11/2007), TENOSYNOVITIS, WRIST (10/11/2007), URI (10/29/2009), and URTICARIA (05/03/2009).  Morbid obesity ([place]) Worsening, ok for referal medical wt manageement, and wegovy if ok with insurance  Vitamin D deficiency . Last vitamin D Lab Results  Component Value Date   VD25OH 45.52 03/22/2020   Stable, cont oral replacement   Hyperglycemia Lab Results  Component Value Date   HGBA1C  5.3 03/22/2020   Stable, pt to continue current medical treatment  - done   GERD (gastroesophageal reflux disease) Worsening with increased wt, to change otc prilosec to protonix 40 qd  RUQ pain Most likely c/w msk strain related to morbid obesity , but can r/o GB or other - for lab today incuding lft's  Hypersomnolence Ok for referal pulmonary, likely OSA related it seems  Followup: No follow-ups on file.  Cathlean Cower, MD 02/04/2021 8:43 PM [facility]

## 2021-02-04 NOTE — Patient Instructions (Signed)
Ok to change the flonase to nasacort  Please take all new medication as prescribed  - the wegovy, or the ozempic if the wegovy is not covered  Please continue all other medications as before, and refills have been done if requested.  Please have the pharmacy call with any other refills you may need.  Please continue your efforts at being more active, low cholesterol diet, and weight control.  Please keep your appointments with your specialists as you may have planned  You will be contacted regarding the referral for: medical weight management clinic, and pulmonary  Please go to the LAB at the blood drawing area for the tests to be done  You will be contacted by phone if any changes need to be made immediately.  Otherwise, you will receive a letter about your results with an explanation, but please check with MyChart first.  Please remember to sign up for MyChart if you have not done so, as this will be important to you in the future with finding out test results, communicating by private email, and scheduling acute appointments online when needed.  We'll see you next in March 2023 for the yearly exam

## 2021-02-04 NOTE — Progress Notes (Deleted)
Patient ID: Martha Thompson, female   DOB: 03/12/1985, 36 y.o.   MRN: [id number]         Chief Complaint:: wellness exam and Obesity (Pt would like to discuss weight gain, [facility] Pt states that she would like to discuss pain under breast and BP)  ***       HPI:  Martha Thompson is a 36 y.o. female here for wellness exam                        Also***   Wt Readings from Last 3 Encounters:  02/04/21 (!) 359 lb (162.8 kg)  03/22/20 (!) 314 lb (142.4 kg)  01/12/20 300 lb (136.1 kg)   BP Readings from Last 3 Encounters:  02/04/21 140/86  01/18/21 (!) 158/89  11/26/20 (!) 167/93   Immunization History  Administered Date(s) Administered   Influenza Whole 11/12/2006, 10/29/2009   Influenza,inj,Quad PF,6+ Mos 11/14/2012, 02/22/2018   Influenza-Unspecified 10/23/2018   Pneumococcal Polysaccharide-23 02/20/2013   Tdap 12/12/2012, 02/19/2017   Health Maintenance Due  Topic Date Due   Hepatitis C Screening  Never done      Past Medical History:  Diagnosis Date   ALLERGIC RHINITIS 10/30/2009   ANXIETY 09/04/2006   ASTHMA 12/25/2006   inhaler used 2 days ago   ASTHMA, WITH ACUTE EXACERBATION 05/03/2009   BLEPHARITIS, LEFT 05/21/2008   Cervical disc disease    2 bulging discs to neck    COMMON MIGRAINE 06/14/2007   GANGLION CYST, WRIST, LEFT 10/11/2007   GERD (gastroesophageal reflux disease) 04/24/2012   Headache(784.0) 10/23/2008   HYPERLIPIDEMIA 06/14/2007   LOW BACK PAIN 09/04/2006   Morbid obesity (HCC) 09/04/2006   OTITIS MEDIA, ACUTE, LEFT 03/27/2008   SINUSITIS- ACUTE-NOS 10/11/2007   TENOSYNOVITIS, WRIST 10/11/2007   URI 10/29/2009   URTICARIA 05/03/2009   Past Surgical History:  Procedure Laterality Date   back surgury  01/2003   s/p lumbar disc   CESAREAN SECTION N/A 02/19/2013   Procedure: CESAREAN SECTION;  Surgeon: Michael LitterNaima A Dillard, MD;  Location: [facility];  Service: Obstetrics;  Laterality: N/A;   LAPAROSCOPIC APPENDECTOMY N/A 12/09/2018    Procedure: APPENDECTOMY LAPAROSCOPIC;  Surgeon: Sung AmabileSakai, Isami, DO;  Location: [facility]S;  Service: General;  Laterality: N/A;   LAPAROSCOPY  04/07/2011   Procedure: LAPAROSCOPY OPERATIVE;  Surgeon: Meriel Picaichard M Holland, MD;  Location: [facility];  Service: Gynecology;  Laterality: N/A;  left salpingogectomy    reports that she has been smoking cigarettes. She has been smoking an average of .5 packs per day. She has never used smokeless tobacco. She reports current alcohol use. She reports that she does not use drugs. family history includes Anxiety disorder in her father; Diabetes in her father. Allergies  Allergen Reactions   Prozac [Fluoxetine Hcl] Other (See Comments)   Current Outpatient Medications on File Prior to Visit  Medication Sig Dispense Refill   albuterol (VENTOLIN HFA) 108 (90 Base) MCG/ACT inhaler INHALE 2 PUFFS INTO THE LUNGS EVERY 6 HOURS AS NEEDED 25.5 each 2   ALPRAZolam (XANAX) 1 MG tablet TAKE 1 TABLET BY MOUTH THREE TIMES A DAY AS NEEDED FOR ANXIETY 90 tablet 2   cetirizine (ZYRTEC) 5 MG tablet Take 10 mg by mouth 2 (two) times daily.     EPINEPHrine 0.3 mg/0.3 mL IJ SOAJ injection Inject 0.3 mg into the muscle as needed.     fluconazole (DIFLUCAN) 150 MG tablet Take 1 tablet (150 mg total)  by mouth daily. Take one tablet today.  May repeat in 3 days. 1 tablet 0   fluticasone (FLONASE) 50 MCG/ACT nasal spray Place 1-2 sprays into both nostrils daily. 16 g 0   Fluticasone-Salmeterol (ADVAIR) 100-50 MCG/DOSE AEPB Inhale 1 puff into the lungs 2 (two) times daily. 3 each 3   hydroxychloroquine (PLAQUENIL) 200 MG tablet Take 200 mg by mouth 2 (two) times daily.     influenza vac recom quadrivalent (FLUBLOK QUADRIVALENT) 0.5 ML injection      levonorgestrel (MIRENA, 52 MG,) 20 MCG/24HR IUD 52 mg by Intrauterine route as directed.     losartan (COZAAR) 25 MG tablet Take 1 tablet (25 mg total) by mouth daily. Annual appt due in March must see provider for future refills 90 tablet 0    meloxicam (MOBIC) 15 MG tablet TAKE 1 TABLET BY MOUTH EVERY DAY AS NEEDED FOR PAIN 90 tablet 1   naproxen (NAPROSYN) 500 MG tablet Take 1 tablet (500 mg total) by mouth 2 (two) times daily. 30 tablet 0   nitrofurantoin, macrocrystal-monohydrate, (MACROBID) 100 MG capsule Take 1 capsule (100 mg total) by mouth 2 (two) times daily. 14 capsule 0   omalizumab (XOLAIR) 150 MG/ML prefilled syringe Inject 300 mg into the skin See admin instructions. INJECT 2 (150 MG) SYRINGES UNDER THE SKIN FOR A TOTAL DOSE OF 300MG  EVERY 4 WEEKS.     phenazopyridine (PYRIDIUM) 200 MG tablet Take 1 tablet (200 mg total) by mouth 3 (three) times daily. 6 tablet 0   phentermine 37.5 MG capsule Take 1 capsule (37.5 mg total) by mouth every morning. 30 capsule 2   promethazine (PHENERGAN) 25 MG tablet TAKE 1 TABLET (25 MG TOTAL) BY MOUTH EVERY 8 (EIGHT) HOURS AS NEEDED FOR NAUSEA OR VOMITING. 20 tablet 1   traMADol (ULTRAM) 50 MG tablet Take 50 mg by mouth every 6 (six) hours as needed.     traZODone (DESYREL) 50 MG tablet TAKE ONE-HALF TO ONE TABLET BY MOUTH AT BEDTIME AS NEEDED FOR SLEEP 90 tablet 1   zolpidem (AMBIEN) 10 MG tablet TAKE 1 TABLET BY MOUTH AT BEDTIME AS NEEDED FOR SLEEP. 90 tablet 1   [DISCONTINUED] famotidine (PEPCID) 20 MG tablet Take 20 mg by mouth 2 (two) times daily.     No current facility-administered medications on file prior to visit.        ROS:  All others reviewed and negative.  Objective        PE:  BP 140/86    Pulse 98    Ht 5\' 11"  (1.803 m)    Wt (!) 359 lb (162.8 kg)    SpO2 99%    BMI 50.07 kg/m                 Constitutional: Pt appears in NAD               HENT: Head: NCAT.                Right Ear: External ear normal.                 Left Ear: External ear normal.                Eyes: . Pupils are equal, round, and reactive to light. Conjunctivae and EOM are normal               Nose: without d/c or deformity  Neck: Neck supple. Gross normal ROM                Cardiovascular: Normal rate and regular rhythm.                 Pulmonary/Chest: Effort normal and breath sounds without rales or wheezing.                Abd:  Soft, NT, ND, + BS, no organomegaly               Neurological: Pt is alert. At baseline orientation, motor grossly intact               Skin: Skin is warm. No rashes, no other new lesions, LE edema - ***               Psychiatric: Pt behavior is normal without agitation   Micro: none  Cardiac tracings I have personally interpreted today:  none  Pertinent Radiological findings (summarize): none   Lab Results  Component Value Date   WBC 8.9 03/22/2020   HGB 14.0 03/22/2020   HCT 41.1 03/22/2020   PLT 231.0 03/22/2020   GLUCOSE 56 (L) 03/22/2020   CHOL 143 03/22/2020   TRIG 133.0 03/22/2020   HDL 39.40 03/22/2020   LDLDIRECT 113.0 02/22/2018   LDLCALC 77 03/22/2020   ALT 18 03/22/2020   AST 18 03/22/2020   NA 138 03/22/2020   K 4.2 03/22/2020   CL 102 03/22/2020   CREATININE 0.80 03/22/2020   BUN 16 03/22/2020   CO2 25 03/22/2020   TSH 1.00 03/22/2020   HGBA1C 5.3 03/22/2020   Assessment/Plan:  THOMASENIA DOWSE is a 36 y.o. White or Caucasian [1] female with  has a past medical history of ALLERGIC RHINITIS (10/30/2009), ANXIETY (09/04/2006), ASTHMA (12/25/2006), ASTHMA, WITH ACUTE EXACERBATION (05/03/2009), BLEPHARITIS, LEFT (05/21/2008), Cervical disc disease, COMMON MIGRAINE (06/14/2007), GANGLION CYST, WRIST, LEFT (10/11/2007), GERD (gastroesophageal reflux disease) (04/24/2012), Headache(784.0) (10/23/2008), HYPERLIPIDEMIA (06/14/2007), LOW BACK PAIN (09/04/2006), Morbid obesity (HCC) (09/04/2006), OTITIS MEDIA, ACUTE, LEFT (03/27/2008), SINUSITIS- ACUTE-NOS (10/11/2007), TENOSYNOVITIS, WRIST (10/11/2007), URI (10/29/2009), and URTICARIA (05/03/2009).  No problem-specific Assessment & Plan notes found for this encounter.  Followup: No follow-ups on file.  Oliver Barre, MD 02/04/2021 3:54 PM [facility]  Primary Care - [facility]

## 2021-02-04 NOTE — Assessment & Plan Note (Signed)
. °  Last vitamin D Lab Results  Component Value Date   VD25OH 45.52 03/22/2020   Stable, cont oral replacement

## 2021-02-04 NOTE — Assessment & Plan Note (Signed)
Most likely c/w msk strain related to morbid obesity , but can r/o GB or other - for lab today incuding lft's

## 2021-02-04 NOTE — Assessment & Plan Note (Signed)
Lab Results  Component Value Date   HGBA1C 5.3 03/22/2020   Stable, pt to continue current medical treatment  - done

## 2021-02-04 NOTE — Assessment & Plan Note (Signed)
Worsening with increased wt, to change otc prilosec to protonix 40 qd

## 2021-02-07 ENCOUNTER — Encounter: Payer: Self-pay | Admitting: Internal Medicine

## 2021-02-07 MED ORDER — OZEMPIC (0.25 OR 0.5 MG/DOSE) 2 MG/1.5ML ~~LOC~~ SOPN: 0.5000 mg | PEN_INJECTOR | SUBCUTANEOUS | 3 refills | Status: DC

## 2021-02-18 ENCOUNTER — Ambulatory Visit
Admission: RE | Admit: 2021-02-18 | Discharge: 2021-02-18 | Disposition: A | Discharge status: Home / Self Care | Payer: 59 | Source: Ambulatory Visit | Attending: Internal Medicine | Admitting: Internal Medicine

## 2021-02-18 DIAGNOSIS — R1011 Right upper quadrant pain: Secondary | ICD-10-CM

## 2021-03-04 ENCOUNTER — Other Ambulatory Visit: Payer: Self-pay | Admitting: Internal Medicine

## 2021-03-17 ENCOUNTER — Emergency Department: Payer: 59

## 2021-03-17 ENCOUNTER — Other Ambulatory Visit: Payer: Self-pay

## 2021-03-17 ENCOUNTER — Encounter: Payer: Self-pay | Admitting: Emergency Medicine

## 2021-03-17 ENCOUNTER — Emergency Department
Admission: EM | Admit: 2021-03-17 | Discharge: 2021-03-17 | Disposition: A | Discharge status: Home / Self Care | Payer: 59 | Attending: Emergency Medicine | Admitting: Emergency Medicine

## 2021-03-17 ENCOUNTER — Ambulatory Visit
Admission: EM | Admit: 2021-03-17 | Discharge: 2021-03-17 | Disposition: A | Discharge status: Home / Self Care | Payer: 59 | Attending: Family Medicine | Admitting: Family Medicine

## 2021-03-17 DIAGNOSIS — R102 Pelvic and perineal pain: Secondary | ICD-10-CM

## 2021-03-17 DIAGNOSIS — R809 Proteinuria, unspecified: Secondary | ICD-10-CM

## 2021-03-17 DIAGNOSIS — R1084 Generalized abdominal pain: Secondary | ICD-10-CM

## 2021-03-17 DIAGNOSIS — N83202 Unspecified ovarian cyst, left side: Secondary | ICD-10-CM | POA: Diagnosis not present

## 2021-03-17 DIAGNOSIS — R251 Tremor, unspecified: Secondary | ICD-10-CM | POA: Insufficient documentation

## 2021-03-17 DIAGNOSIS — R1030 Lower abdominal pain, unspecified: Secondary | ICD-10-CM | POA: Diagnosis not present

## 2021-03-17 LAB — POCT FASTING CBG KUC MANUAL ENTRY: POCT Glucose (KUC): 113 mg/dL — AB (ref 70–99)

## 2021-03-17 LAB — POCT URINALYSIS DIP (MANUAL ENTRY)
Bilirubin, UA: NEGATIVE
Blood, UA: NEGATIVE
Glucose, UA: NEGATIVE mg/dL
Ketones, POC UA: NEGATIVE mg/dL
Leukocytes, UA: NEGATIVE
Nitrite, UA: NEGATIVE
Protein Ur, POC: 100 mg/dL — AB
Spec Grav, UA: 1.02 (ref 1.010–1.025)
Urobilinogen, UA: 0.2 U/dL
pH, UA: 5.5 (ref 5.0–8.0)

## 2021-03-17 LAB — URINALYSIS, ROUTINE W REFLEX MICROSCOPIC
Bilirubin Urine: NEGATIVE
Glucose, UA: NEGATIVE mg/dL
Hgb urine dipstick: NEGATIVE
Ketones, ur: NEGATIVE mg/dL
Leukocytes,Ua: NEGATIVE
Nitrite: NEGATIVE
Protein, ur: NEGATIVE mg/dL
Specific Gravity, Urine: 1.015 (ref 1.005–1.030)
pH: 6 (ref 5.0–8.0)

## 2021-03-17 LAB — COMPREHENSIVE METABOLIC PANEL WITH GFR
ALT: 22 U/L (ref 0–44)
AST: 20 U/L (ref 15–41)
Albumin: 4.3 g/dL (ref 3.5–5.0)
Alkaline Phosphatase: 43 U/L (ref 38–126)
Anion gap: 9 (ref 5–15)
BUN: 15 mg/dL (ref 6–20)
CO2: 24 mmol/L (ref 22–32)
Calcium: 9.5 mg/dL (ref 8.9–10.3)
Chloride: 104 mmol/L (ref 98–111)
Creatinine, Ser: 0.67 mg/dL (ref 0.44–1.00)
GFR, Estimated: >60 mL/min (ref >60)
Glucose, Bld: 102 mg/dL — ABNORMAL HIGH (ref 70–99)
Potassium: 4.2 mmol/L (ref 3.5–5.1)
Sodium: 137 mmol/L (ref 135–145)
Total Bilirubin: 0.6 mg/dL (ref 0.3–1.2)
Total Protein: 7.9 g/dL (ref 6.5–8.1)

## 2021-03-17 LAB — CBC
HCT: 43.5 % (ref 36.0–46.0)
Hemoglobin: 14.8 g/dL (ref 12.0–15.0)
MCH: 31 pg (ref 26.0–34.0)
MCHC: 34 g/dL (ref 30.0–36.0)
MCV: 91 fL (ref 80.0–100.0)
Platelets: 273 K/uL (ref 150–400)
RBC: 4.78 MIL/uL (ref 3.87–5.11)
RDW: 11.5 % (ref 11.5–15.5)
WBC: 7.8 K/uL (ref 4.0–10.5)
nRBC: 0 % (ref 0.0–0.2)

## 2021-03-17 LAB — LIPASE, BLOOD: Lipase: 49 U/L (ref 11–51)

## 2021-03-17 LAB — POCT URINE PREGNANCY: Preg Test, Ur: NEGATIVE

## 2021-03-17 LAB — POC URINE PREG, ED: Preg Test, Ur: NEGATIVE

## 2021-03-17 MED ORDER — IBUPROFEN 800 MG PO TABS: 800.0000 mg | ORAL_TABLET | Freq: Three times a day (TID) | ORAL | 0 refills | Status: DC | PRN

## 2021-03-17 MED ORDER — OXYCODONE HCL 5 MG PO TABS: 5.0000 mg | ORAL_TABLET | Freq: Four times a day (QID) | ORAL | 0 refills | Status: AC | PRN

## 2021-03-17 MED ORDER — HYDROCODONE-ACETAMINOPHEN 5-325 MG PO TABS: 1.0000 | ORAL_TABLET | Freq: Four times a day (QID) | ORAL | 0 refills | Status: DC | PRN

## 2021-03-17 MED ORDER — KETOROLAC TROMETHAMINE 60 MG/2ML IM SOLN
60.0000 mg | Freq: Once | INTRAMUSCULAR | Status: AC
  Administered 2021-03-17: 15:00:00 60 mg via INTRAMUSCULAR
  Filled 2021-03-17: qty 2

## 2021-03-17 NOTE — Discharge Instructions (Addendum)
Recommend evaluation at the Emergency Department for further work-up and evaluation of your abdominal pain. ?

## 2021-03-17 NOTE — ED Triage Notes (Signed)
Pt c/o sudden onset RLQ pain with nausea this morning. Denies vomiting or diarrhea ?

## 2021-03-17 NOTE — ED Provider Notes (Signed)
? ?[facility] ?Provider Note ? ? ? Event Date/Time  ? First MD Initiated Contact with Patient 03/17/21 1319   ?  (approximate) ? ? ?History  ? ?Abdominal Pain ? ? ?HPI ? ?Martha Thompson is a 36 y.o. female with no significant past medical history here with severe lower abdominal pain.  The patient states that her symptoms started fairly acutely today.  She was otherwise well when she developed acute, severe, lower abdominal pain.  This radiated towards her back.  She felt nauseous and like she was going to pass out.  The pain then resolved.  She now feels somewhat better.  She had nausea but no vomiting.  No diarrhea.  No constipation.  No history of ovarian cyst.  No history of similar presentations. ?  ? ? ?Physical Exam  ? ?Triage Vital Signs: ?ED Triage Vitals  ?Enc Vitals Group  ?   BP 03/17/21 1204 (!) 151/96  ?   Pulse Rate 03/17/21 1201 91  ?   Resp 03/17/21 1201 18  ?   Temp 03/17/21 1201 97.6 ?F (36.4 ?C)  ?   Temp Source 03/17/21 1201 Oral  ?   SpO2 03/17/21 1201 96 %  ?   Weight 03/17/21 1202 (!) 350 lb (158.8 kg)  ?   Height 03/17/21 1202 5\' 11"  (1.803 m)  ?   Head Circumference --   ?   Peak Flow --   ?   Pain Score 03/17/21 1202 4  ?   Pain Loc --   ?   Pain Edu? --   ?   Excl. in [place]? --   ? ? ?Most recent vital signs: ?Vitals:  ? 03/17/21 1204 03/17/21 1716  ?BP: (!) 151/96 (!) 146/88  ?Pulse:  88  ?Resp:  17  ?Temp:  97.8 ?F (36.6 ?C)  ?SpO2:  98%  ? ? ? ?General: Awake, no distress.  ?CV:  Good peripheral perfusion.  ?Resp:  Normal effort.  ?Abd:  No distention.  Minimal suprapubic and bilateral lower quadrant tenderness.  No rebound or guarding.  No distention.  No peritonitis. ?Other:  Well-perfused. ? ? ?ED Results / Procedures / Treatments  ? ?Labs ?(all labs ordered are listed, but only abnormal results are displayed) ?Labs Reviewed  ?COMPREHENSIVE METABOLIC PANEL - Abnormal; Notable for the following components:  ?    Result Value  ? Glucose, Bld 102 (*)   ? All  other components within normal limits  ?URINALYSIS, ROUTINE W REFLEX MICROSCOPIC - Abnormal; Notable for the following components:  ? Color, Urine YELLOW (*)   ? APPearance HAZY (*)   ? All other components within normal limits  ?LIPASE, BLOOD  ?CBC  ?POC URINE PREG, ED  ? ? ? ?EKG ? ? ? ?RADIOLOGY ?US Pelvis: 3.1 cm cyst in left ovary, likely dominant follicle or functional cyst ? ? ?I also independently reviewed and agree wit radiologist interpretations. ? ? ?PROCEDURES: ? ?Critical Care performed: No ? ? ?MEDICATIONS ORDERED IN ED: ?Medications  ?ketorolac (TORADOL) injection 60 mg (60 mg Intramuscular Given 03/17/21 1520)  ? ? ? ?IMPRESSION / MDM / ASSESSMENT AND PLAN / ED COURSE  ?I reviewed the triage vital signs and the nursing notes. ?             ?               ? ? ?Ddx:  ?Ovarian cyst, torsion, diverticulitis, colitis, ileus, IBS, IBD, UTI with bladder spasm, nephrolithiasis ? ? ?  MDM:  ?36 yo well appearing F here with acute lower abd pain, now improved. Pt is s/p appendectomy. Abdomen soft, NT, ND. No fever, leukocytosis. She is HDS. Labs reviewed, CBC without leukocytosis. CMP unremarkable. Lipase normal. UA shows no signs of UTI.  ? ?Based on story, suspect possible ovarian cyst rupture. Pt recently started on Ozempic and has lost significant weight, and reportedly began spotting last week for the first time in years despite having IUD. Suspect estrogen-related changes causing ovarian cyst formation w/ pain. U/S subsequently obtained, reviewed, and does show functional cyst vs dominant follicle. ? ?On serial exams, pt has no significant TTP. No focal TTP, leukocytosis, fever, or signs to suggest obstruction, diverticulitis, or other intra-abd emergency. Suspect pain related to ovarian cyst/dysmennorhea. Will have her call OB to discuss, advise NSAIDs PRN pain and give a brief course of analgesics. ? ? ?MEDICATIONS GIVEN IN ED: ?Medications  ?ketorolac (TORADOL) injection 60 mg (60 mg Intramuscular Given  03/17/21 1520)  ? ? ? ?Consults:  ?None ? ? ?EMR reviewed  ?Dr. Cathlean Cower PCP notes, including initiation of Ozempic 02/04/21 ? ? ? ? ?FINAL CLINICAL IMPRESSION(S) / ED DIAGNOSES  ? ?Final diagnoses:  ?Pelvic pain  ?Cyst of left ovary  ?Generalized abdominal pain  ? ? ? ?Rx / DC Orders  ? ?ED Discharge Orders   ? ?      Ordered  ?  HYDROcodone-acetaminophen (NORCO/VICODIN) 5-325 MG tablet  Every 6 hours PRN,   Status:  Discontinued       ? 03/17/21 1604  ?  ibuprofen (ADVIL) 800 MG tablet  Every 8 hours PRN       ? 03/17/21 1604  ?  oxyCODONE (ROXICODONE) 5 MG immediate release tablet  Every 6 hours PRN       ? 03/17/21 1622  ? ?  ?  ? ?  ? ? ? ?Note:  This document was prepared using Dragon voice recognition software and may include unintentional dictation errors. ?  ?Duffy Bruce, MD ?03/17/21 2246 ? ?

## 2021-03-17 NOTE — ED Triage Notes (Signed)
Pt presents with lower abdominal pain and feeling shaky since this morning. ?

## 2021-03-17 NOTE — ED Provider Notes (Signed)
Martha Thompson    CSN: [id number] Arrival date & time: 03/17/21  4098      History   Chief Complaint Chief Complaint  Patient presents with   Abdominal Pain    HPI Martha Thompson is a 36 y.o. female.   HPI Patient presents today with lower bilateral abdominal pain along with shaking which developed upon awakening this morning.  Patient denies any vomiting, diarrhea or any dysuria symptoms.  Patient has irregular menstrual cycle however reports she did have a period 2 weeks ago. She is afebrile. Rates the pain as severe, stabbing, and exacerbated by standing.  Past Medical History:  Diagnosis Date   ALLERGIC RHINITIS 10/30/2009   ANXIETY 09/04/2006   ASTHMA 12/25/2006   inhaler used 2 days ago   ASTHMA, WITH ACUTE EXACERBATION 05/03/2009   BLEPHARITIS, LEFT 05/21/2008   Cervical disc disease    2 bulging discs to neck    COMMON MIGRAINE 06/14/2007   GANGLION CYST, WRIST, LEFT 10/11/2007   GERD (gastroesophageal reflux disease) 04/24/2012   Headache(784.0) 10/23/2008   HYPERLIPIDEMIA 06/14/2007   LOW BACK PAIN 09/04/2006   Morbid obesity (HCC) 09/04/2006   OTITIS MEDIA, ACUTE, LEFT 03/27/2008   SINUSITIS- ACUTE-NOS 10/11/2007   TENOSYNOVITIS, WRIST 10/11/2007   URI 10/29/2009   URTICARIA 05/03/2009    Patient Active Problem List   Diagnosis Date Noted   RUQ pain 02/04/2021   Hypersomnolence 02/04/2021   Vitamin D deficiency 03/22/2020   Asthma exacerbation 03/07/2019   Acute appendicitis 12/09/2018   STD exposure 02/19/2017   Cough 08/08/2016   Chronic pain of left knee 08/08/2016   Insomnia 02/08/2016   Acute left lumbar radiculopathy 09/16/2015   Hyperglycemia 08/13/2015   GERD (gastroesophageal reflux disease) 04/24/2012   Wheezing 04/17/2011   Encounter for well adult exam with abnormal findings 04/13/2010   Allergic rhinitis 10/30/2009   Acute upper respiratory infection 10/29/2009   DISC DISEASE, CERVICAL 10/29/2009   Hives 05/03/2009   Essential  hypertension 10/11/2007   HLD (hyperlipidemia) 06/14/2007   Asthma 12/25/2006   Morbid obesity (HCC) 09/04/2006   Generalized anxiety disorder 09/04/2006   Anxiety with depression 09/04/2006   LOW BACK PAIN 09/04/2006    Past Surgical History:  Procedure Laterality Date   back surgury  01/2003   s/p lumbar disc   CESAREAN SECTION N/A 02/19/2013   Procedure: CESAREAN SECTION;  Surgeon: Michael Litter, MD;  Location: [facility];  Service: Obstetrics;  Laterality: N/A;   LAPAROSCOPIC APPENDECTOMY N/A 12/09/2018   Procedure: APPENDECTOMY LAPAROSCOPIC;  Surgeon: Sung Amabile, DO;  Location: [facility]S;  Service: General;  Laterality: N/A;   LAPAROSCOPY  04/07/2011   Procedure: LAPAROSCOPY OPERATIVE;  Surgeon: Meriel Pica, MD;  Location: [facility];  Service: Gynecology;  Laterality: N/A;  left salpingogectomy    OB History     Gravida  3   Para  1   Term  1   Preterm      AB  2   Living  1      SAB  1   IAB      Ectopic  1   Multiple      Live Births  1            Home Medications    Prior to Admission medications   Medication Sig Start Date End Date Taking? Authorizing Provider  albuterol (VENTOLIN HFA) 108 (90 Base) MCG/ACT inhaler INHALE 2 PUFFS INTO THE LUNGS EVERY 6 HOURS AS NEEDED 10/13/20  Yes Corwin LevinsJohn, James W, MD  ALPRAZolam Prudy Feeler(XANAX) 1 MG tablet TAKE 1 TABLET BY MOUTH THREE TIMES A DAY AS NEEDED FOR ANXIETY 01/12/21  Yes Corwin LevinsJohn, James W, MD  cetirizine (ZYRTEC) 5 MG tablet Take 10 mg by mouth 2 (two) times daily.   Yes [provider]  EPINEPHrine 0.3 mg/0.3 mL IJ SOAJ injection Inject 0.3 mg into the muscle as needed. 07/25/18  Yes [provider]  fluticasone (FLONASE) 50 MCG/ACT nasal spray Place 1-2 sprays into both nostrils daily. 01/12/20  Yes Wieters, Hallie C, PA-C  Fluticasone-Salmeterol (ADVAIR) 100-50 MCG/DOSE AEPB Inhale 1 puff into the lungs 2 (two) times daily. 03/07/19  Yes Corwin LevinsJohn, James W, MD  hydroxychloroquine (PLAQUENIL) 200 MG tablet  Take 200 mg by mouth 2 (two) times daily. 07/28/18  Yes [provider]  hydrOXYzine (ATARAX) 50 MG tablet Take 100 mg by mouth at bedtime. 10/08/20  Yes [provider]  levonorgestrel (MIRENA, 52 MG,) 20 MCG/DAY IUD Mirena 20 mcg/24 hours (7 yrs) 52 mg intrauterine device  Take 1 device by intrauterine route as directed.   Yes [provider]  losartan (COZAAR) 25 MG tablet Take 1 tablet (25 mg total) by mouth daily. 03/04/21  Yes Corwin LevinsJohn, James W, MD  omalizumab Geoffry Paradise(XOLAIR) 150 MG/ML prefilled syringe Inject 300 mg into the skin See admin instructions. INJECT 2 (150 MG) SYRINGES UNDER THE SKIN FOR A TOTAL DOSE OF 300MG  EVERY 4 WEEKS. 06/17/18  Yes [provider]  pantoprazole (PROTONIX) 40 MG tablet Take 1 tablet (40 mg total) by mouth daily. 02/04/21  Yes Corwin LevinsJohn, James W, MD  Semaglutide,0.25 or 0.5MG /DOS, (OZEMPIC, 0.25 OR 0.5 MG/DOSE,) 2 MG/1.5ML SOPN Inject 0.5 mg into the skin once a week. 02/07/21  Yes Corwin LevinsJohn, James W, MD  zolpidem (AMBIEN) 10 MG tablet TAKE 1 TABLET BY MOUTH AT BEDTIME AS NEEDED FOR SLEEP. 09/28/20  Yes Corwin LevinsJohn, James W, MD  fluconazole (DIFLUCAN) 150 MG tablet Take 1 tablet (150 mg total) by mouth daily. Take one tablet today.  May repeat in 3 days. 01/18/21   Mickie Bailate, Kelly H, NP  influenza vac recom quadrivalent (FLUBLOK QUADRIVALENT) 0.5 ML injection     [provider]  meloxicam (MOBIC) 15 MG tablet TAKE 1 TABLET BY MOUTH EVERY DAY AS NEEDED FOR PAIN 03/04/21   Corwin LevinsJohn, James W, MD  naproxen (NAPROSYN) 500 MG tablet Take 1 tablet (500 mg total) by mouth 2 (two) times daily. 01/12/20   Wieters, Hallie C, PA-C  phenazopyridine (PYRIDIUM) 200 MG tablet Take 1 tablet (200 mg total) by mouth 3 (three) times daily. 11/28/19   Cathie HoopsYu, Amy V, PA-C  promethazine (PHENERGAN) 25 MG tablet TAKE 1 TABLET (25 MG TOTAL) BY MOUTH EVERY 8 (EIGHT) HOURS AS NEEDED FOR NAUSEA OR VOMITING. 11/09/19   Corwin LevinsJohn, James W, MD  Semaglutide-Weight Management 1 MG/0.5ML SOAJ Inject 1 mg  into the skin once a week for 28 days. 04/03/21 05/01/21  Corwin LevinsJohn, James W, MD  Semaglutide-Weight Management 1.7 MG/0.75ML SOAJ Inject 1.7 mg into the skin once a week for 28 days. 05/02/21 05/30/21  Corwin LevinsJohn, James W, MD  Semaglutide-Weight Management 2.4 MG/0.75ML SOAJ Inject 2.4 mg into the skin once a week for 28 days. 05/31/21 06/28/21  Corwin LevinsJohn, James W, MD  traMADol (ULTRAM) 50 MG tablet Take 50 mg by mouth every 6 (six) hours as needed. 02/04/20   [provider]  triamcinolone (NASACORT) 55 MCG/ACT AERO nasal inhaler Place 2 sprays into the nose daily. 02/04/21   Oliver BarreJohn, James  W, MD  famotidine (PEPCID) 20 MG tablet Take 20 mg by mouth 2 (two) times daily. 08/15/18 01/12/20  [provider]    Family History Family History  Problem Relation Age of Onset   Anxiety disorder Father    Diabetes Father     Social History Social History   Tobacco Use   Smoking status: Former    Packs/day: 0.50    Types: Cigarettes   Smokeless tobacco: Never  Vaping Use   Vaping Use: Every day  Substance Use Topics   Alcohol use: Yes    Alcohol/week: 0.0 standard drinks    Comment: rarely   Drug use: No     Allergies   Prozac [fluoxetine hcl]   Review of Systems Review of Systems Pertinent negatives listed in HPI  Physical Exam Triage Vital Signs ED Triage Vitals  Enc Vitals Group     BP 03/17/21 1019 (!) 166/101     Pulse Rate 03/17/21 1019 92     Resp 03/17/21 1019 18     Temp 03/17/21 1019 98.5 F (36.9 C)     Temp Source 03/17/21 1019 Oral     SpO2 03/17/21 1019 96 %     Weight --      Height --      Head Circumference --      Peak Flow --      Pain Score 03/17/21 1014 7     Pain Loc --      Pain Edu? --      Excl. in [place]? --    No data found.  Updated Vital Signs BP (!) 166/101 (BP Location: Right Wrist)    Pulse 92    Temp 98.5 F (36.9 C) (Oral)    Resp 18    SpO2 96%   Visual Acuity Right Eye Distance:   Left Eye Distance:   Bilateral Distance:    Right Eye  Near:   Left Eye Near:    Bilateral Near:     Physical Exam Constitutional:      Appearance: She is obese.  HENT:     Head: Normocephalic and atraumatic.  Cardiovascular:     Rate and Rhythm: Normal rate and regular rhythm.  Pulmonary:     Effort: Pulmonary effort is normal.     Breath sounds: Normal breath sounds.  Abdominal:     General: Bowel sounds are normal.     Palpations: Abdomen is soft.     Tenderness: There is abdominal tenderness in the right lower quadrant and left lower quadrant.  Skin:    General: Skin is warm.     Capillary Refill: Capillary refill takes less than 2 seconds.  Neurological:     General: No focal deficit present.     Mental Status: She is alert.  Psychiatric:        Mood and Affect: Mood normal.        Behavior: Behavior normal.     UC Treatments / Results  Labs (all labs ordered are listed, but only abnormal results are displayed) Labs Reviewed  POCT URINALYSIS DIP (MANUAL ENTRY) - Abnormal; Notable for the following components:      Result Value   Clarity, UA clear (*)    Protein Ur, POC =100 (*)    All other components within normal limits  URINE CULTURE  POCT URINE PREGNANCY    EKG   Radiology No results found.  Procedures Procedures (including critical care time)  Medications Ordered in UC Medications -  No data to display  Initial Impression / Assessment and Plan / UC Course  I have reviewed the triage vital signs and the nursing notes.  Pertinent labs & imaging results that were available during my care of the patient were reviewed by me and considered in my medical decision making (see chart for details).    Patient is being deferred to the ER for further work-up of cause of abdominal pain. BS 113, therefore patient is not hypoglycemic . Urine HCG is negative. UA significant for >100 protein therefore, I will order urine culture to rule out symptoms are related to acute UTI  Vital signs are stable. Patient verbalized  understanding and agreement with plan. Final Clinical Impressions(s) / UC Diagnoses   Final diagnoses:  Lower abdominal pain  Proteinuria, unspecified type  Shakiness   Discharge Instructions   None    ED Prescriptions   None    PDMP not reviewed this encounter.   Bing Neighbors, FNP 03/17/21 1104

## 2021-03-18 LAB — URINE CULTURE: Special Requests: NORMAL

## 2021-03-23 ENCOUNTER — Encounter: Payer: Self-pay | Admitting: Internal Medicine

## 2021-03-23 ENCOUNTER — Other Ambulatory Visit: Payer: Self-pay | Admitting: Internal Medicine

## 2021-03-23 ENCOUNTER — Ambulatory Visit (INDEPENDENT_AMBULATORY_CARE_PROVIDER_SITE_OTHER): Payer: 59 | Admitting: Internal Medicine

## 2021-03-23 ENCOUNTER — Other Ambulatory Visit: Payer: Self-pay

## 2021-03-23 VITALS — BP 116/72 | HR 85 | Temp 98.2°F | Ht 71.0 in | Wt 344.0 lb

## 2021-03-23 DIAGNOSIS — E559 Vitamin D deficiency, unspecified: Secondary | ICD-10-CM

## 2021-03-23 DIAGNOSIS — Z1159 Encounter for screening for other viral diseases: Secondary | ICD-10-CM

## 2021-03-23 DIAGNOSIS — R739 Hyperglycemia, unspecified: Secondary | ICD-10-CM

## 2021-03-23 DIAGNOSIS — N83202 Unspecified ovarian cyst, left side: Secondary | ICD-10-CM | POA: Diagnosis not present

## 2021-03-23 DIAGNOSIS — Z0001 Encounter for general adult medical examination with abnormal findings: Secondary | ICD-10-CM | POA: Diagnosis not present

## 2021-03-23 DIAGNOSIS — M7072 Other bursitis of hip, left hip: Secondary | ICD-10-CM

## 2021-03-23 DIAGNOSIS — I10 Essential (primary) hypertension: Secondary | ICD-10-CM

## 2021-03-23 DIAGNOSIS — E7800 Pure hypercholesterolemia, unspecified: Secondary | ICD-10-CM | POA: Diagnosis not present

## 2021-03-23 DIAGNOSIS — L03011 Cellulitis of right finger: Secondary | ICD-10-CM | POA: Diagnosis not present

## 2021-03-23 DIAGNOSIS — E538 Deficiency of other specified B group vitamins: Secondary | ICD-10-CM

## 2021-03-23 LAB — URINALYSIS, ROUTINE W REFLEX MICROSCOPIC
Bilirubin Urine: NEGATIVE
Hgb urine dipstick: NEGATIVE
Ketones, ur: NEGATIVE
Leukocytes,Ua: NEGATIVE
Nitrite: NEGATIVE
RBC / HPF: NONE SEEN (ref 0–?)
Specific Gravity, Urine: 1.01 (ref 1.000–1.030)
Total Protein, Urine: NEGATIVE
Urine Glucose: NEGATIVE
Urobilinogen, UA: 0.2 (ref 0.0–1.0)
pH: 6.5 (ref 5.0–8.0)

## 2021-03-23 LAB — BASIC METABOLIC PANEL
BUN: 14 mg/dL (ref 6–23)
CO2: 30 mEq/L (ref 19–32)
Calcium: 10.2 mg/dL (ref 8.4–10.5)
Chloride: 103 mEq/L (ref 96–112)
Creatinine, Ser: 0.72 mg/dL (ref 0.40–1.20)
GFR: 108.82 mL/min (ref >60.00)
Glucose, Bld: 86 mg/dL (ref 70–99)
Potassium: 4.2 mEq/L (ref 3.5–5.1)
Sodium: 139 mEq/L (ref 135–145)

## 2021-03-23 LAB — CBC WITH DIFFERENTIAL/PLATELET
Basophils Absolute: 0 10*3/uL (ref 0.0–0.1)
Basophils Relative: 0.5 % (ref 0.0–3.0)
Eosinophils Absolute: 0.3 10*3/uL (ref 0.0–0.7)
Eosinophils Relative: 4.2 % (ref 0.0–5.0)
HCT: 42 % (ref 36.0–46.0)
Hemoglobin: 14.3 g/dL (ref 12.0–15.0)
Lymphocytes Relative: 34.9 % (ref 12.0–46.0)
Lymphs Abs: 2.3 10*3/uL (ref 0.7–4.0)
MCHC: 34.1 g/dL (ref 30.0–36.0)
MCV: 92.3 fl (ref 78.0–100.0)
Monocytes Absolute: 0.5 10*3/uL (ref 0.1–1.0)
Monocytes Relative: 7.4 % (ref 3.0–12.0)
Neutro Abs: 3.5 10*3/uL (ref 1.4–7.7)
Neutrophils Relative %: 53 % (ref 43.0–77.0)
Platelets: 242 10*3/uL (ref 150.0–400.0)
RBC: 4.56 Mil/uL (ref 3.87–5.11)
RDW: 12.4 % (ref 11.5–15.5)
WBC: 6.5 10*3/uL (ref 4.0–10.5)

## 2021-03-23 LAB — LIPID PANEL
Cholesterol: 149 mg/dL (ref 0–200)
HDL: 39.9 mg/dL (ref >39.00)
LDL Cholesterol: 92 mg/dL (ref 0–99)
NonHDL: 108.94
Total CHOL/HDL Ratio: 4
Triglycerides: 84 mg/dL (ref 0.0–149.0)
VLDL: 16.8 mg/dL (ref 0.0–40.0)

## 2021-03-23 LAB — HEPATIC FUNCTION PANEL
ALT: 18 U/L (ref 0–35)
AST: 20 U/L (ref 0–37)
Albumin: 4.4 g/dL (ref 3.5–5.2)
Alkaline Phosphatase: 38 U/L — ABNORMAL LOW (ref 39–117)
Bilirubin, Direct: 0.1 mg/dL (ref 0.0–0.3)
Total Bilirubin: 0.9 mg/dL (ref 0.2–1.2)
Total Protein: 7.2 g/dL (ref 6.0–8.3)

## 2021-03-23 LAB — VITAMIN B12: Vitamin B-12: 609 pg/mL (ref 211–911)

## 2021-03-23 LAB — TSH: TSH: 1.16 u[IU]/mL (ref 0.35–5.50)

## 2021-03-23 LAB — VITAMIN D 25 HYDROXY (VIT D DEFICIENCY, FRACTURES): VITD: 47.61 ng/mL (ref 30.00–100.00)

## 2021-03-23 LAB — HEMOGLOBIN A1C: Hgb A1c MFr Bld: 5.3 % (ref 4.6–6.5)

## 2021-03-23 MED ORDER — PROMETHAZINE HCL 25 MG PO TABS: 25.0000 mg | ORAL_TABLET | Freq: Three times a day (TID) | ORAL | 1 refills | Status: DC | PRN

## 2021-03-23 MED ORDER — DOXYCYCLINE HYCLATE 100 MG PO TABS: 100.0000 mg | ORAL_TABLET | Freq: Two times a day (BID) | ORAL | 0 refills | Status: DC

## 2021-03-23 MED ORDER — ONDANSETRON HCL 4 MG PO TABS: 4.0000 mg | ORAL_TABLET | Freq: Three times a day (TID) | ORAL | 2 refills | Status: DC | PRN

## 2021-03-23 NOTE — Patient Instructions (Signed)
Please take all new medication as prescribed - the antibiotic, and zofran for nausea ? ?Please continue all other medications as before, though you could do the 0.25 mg ozempic to see if the GI symptoms are more tolerable and still get wt loss ? ?Please have the pharmacy call with any other refills you may need. ? ?Please continue your efforts at being more active, low cholesterol diet, and weight control. ? ?You are otherwise up to date with prevention measures today. ? ?Please keep your appointments with your specialists as you may have planned ? ?Please see Sports medicine if the left hip pain gets worse ? ?Please make sure to call GYN regarding follow up for the left ovary cyst ? ?Please go to the LAB at the blood drawing area for the tests to be done ? ?You will be contacted by phone if any changes need to be made immediately.  Otherwise, you will receive a letter about your results with an explanation, but please check with MyChart first. ? ?Please remember to sign up for MyChart if you have not done so, as this will be important to you in the future with finding out test results, communicating by private email, and scheduling acute appointments online when needed. ? ?Please make an Appointment to return for your 1 year visit, or sooner if needed ?

## 2021-03-23 NOTE — Progress Notes (Signed)
Patient ID: Martha Thompson, female   DOB: 01/20/1985, 36 y.o.   MRN: [id number]         Chief Complaint:: wellness exam and left ovary cyst, left hip bursitis, finger redness       HPI:  Martha Thompson is a 35 y.o. female here for wellness exam; due for hep c screen, o/w already up to date                        Also Has lost 15 lbs with ozempic x 5 wks but does have some GI side effects with bloating and crampy pains but plans to work on not eating after dinner.  Phenergan helps the nausea but causes fatigue.  Was seen in ED with pelvic pain and finding 3.1 cm left ovary cyst, has not had to take the oycodone 5 mg #8 from the ED. Plans to call GYN later today for appt.  Also has incidental red/pain swelling near the nail bed of the 4th finger right hand worsening quickly x 3 days, without fever, drainage, chills. Also has tender soreness to the left lateral hip area without overt swelling or redness for 2 wks, worse to lie on left side, better to not do this Wt Readings from Last 3 Encounters:  03/23/21 (!) 344 lb (156 kg)  03/17/21 (!) 350 lb (158.8 kg)  02/04/21 (!) 359 lb (162.8 kg)   BP Readings from Last 3 Encounters:  03/23/21 116/72  03/17/21 (!) 146/88  03/17/21 (!) 166/101   Immunization History  Administered Date(s) Administered   Influenza Whole 11/12/2006, 10/29/2009   Influenza,inj,Quad PF,6+ Mos 11/14/2012, 02/22/2018   Influenza-Unspecified 10/23/2018   Pneumococcal Polysaccharide-23 02/20/2013   Tdap 12/12/2012, 02/19/2017   There are no preventive care reminders to display for this patient.     Past Medical History:  Diagnosis Date   ALLERGIC RHINITIS 10/30/2009   ANXIETY 09/04/2006   ASTHMA 12/25/2006   inhaler used 2 days ago   ASTHMA, WITH ACUTE EXACERBATION 05/03/2009   BLEPHARITIS, LEFT 05/21/2008   Cervical disc disease    2 bulging discs to neck    COMMON MIGRAINE 06/14/2007   GANGLION CYST, WRIST, LEFT 10/11/2007   GERD (gastroesophageal  reflux disease) 04/24/2012   Headache(784.0) 10/23/2008   HYPERLIPIDEMIA 06/14/2007   LOW BACK PAIN 09/04/2006   Morbid obesity (HCC) 09/04/2006   OTITIS MEDIA, ACUTE, LEFT 03/27/2008   SINUSITIS- ACUTE-NOS 10/11/2007   TENOSYNOVITIS, WRIST 10/11/2007   URI 10/29/2009   URTICARIA 05/03/2009   Past Surgical History:  Procedure Laterality Date   back surgury  01/2003   s/p lumbar disc   CESAREAN SECTION N/A 02/19/2013   Procedure: CESAREAN SECTION;  Surgeon: Michael Litter, MD;  Location: [facility];  Service: Obstetrics;  Laterality: N/A;   LAPAROSCOPIC APPENDECTOMY N/A 12/09/2018   Procedure: APPENDECTOMY LAPAROSCOPIC;  Surgeon: Sung Amabile, DO;  Location: [facility];  Service: General;  Laterality: N/A;   LAPAROSCOPY  04/07/2011   Procedure: LAPAROSCOPY OPERATIVE;  Surgeon: Meriel Pica, MD;  Location: [facility];  Service: Gynecology;  Laterality: N/A;  left salpingogectomy    reports that she has quit smoking. Her smoking use included cigarettes. She smoked an average of .5 packs per day. She has never used smokeless tobacco. She reports current alcohol use. She reports that she does not use drugs. family history includes Anxiety disorder in her father; Diabetes in her father. Allergies  Allergen Reactions   Prozac [Fluoxetine Hcl] Other (  See Comments)   Current Outpatient Medications on File Prior to Visit  Medication Sig Dispense Refill   albuterol (VENTOLIN HFA) 108 (90 Base) MCG/ACT inhaler INHALE 2 PUFFS INTO THE LUNGS EVERY 6 HOURS AS NEEDED 25.5 each 2   ALPRAZolam (XANAX) 1 MG tablet TAKE 1 TABLET BY MOUTH THREE TIMES A DAY AS NEEDED FOR ANXIETY 90 tablet 2   cetirizine (ZYRTEC) 5 MG tablet Take 10 mg by mouth 2 (two) times daily.     EPINEPHrine 0.3 mg/0.3 mL IJ SOAJ injection Inject 0.3 mg into the muscle as needed.     fluticasone (FLONASE) 50 MCG/ACT nasal spray Place 1-2 sprays into both nostrils daily. 16 g 0   Fluticasone-Salmeterol (ADVAIR) 100-50 MCG/DOSE AEPB Inhale 1 puff  into the lungs 2 (two) times daily. 3 each 3   hydroxychloroquine (PLAQUENIL) 200 MG tablet Take 200 mg by mouth 2 (two) times daily.     hydrOXYzine (ATARAX) 50 MG tablet Take 100 mg by mouth at bedtime.     ibuprofen (ADVIL) 800 MG tablet Take 1 tablet (800 mg total) by mouth every 8 (eight) hours as needed for cramping or moderate pain. 20 tablet 0   levonorgestrel (MIRENA, 52 MG,) 20 MCG/DAY IUD Mirena 20 mcg/24 hours (7 yrs) 52 mg intrauterine device  Take 1 device by intrauterine route as directed.     losartan (COZAAR) 25 MG tablet Take 1 tablet (25 mg total) by mouth daily. 90 tablet 3   meloxicam (MOBIC) 15 MG tablet TAKE 1 TABLET BY MOUTH EVERY DAY AS NEEDED FOR PAIN 90 tablet 3   naproxen (NAPROSYN) 500 MG tablet Take 1 tablet (500 mg total) by mouth 2 (two) times daily. 30 tablet 0   omalizumab (XOLAIR) 150 MG/ML prefilled syringe Inject 300 mg into the skin See admin instructions. INJECT 2 (150 MG) SYRINGES UNDER THE SKIN FOR A TOTAL DOSE OF 300MG  EVERY 4 WEEKS.     oxyCODONE (ROXICODONE) 5 MG immediate release tablet Take 1 tablet (5 mg total) by mouth every 6 (six) hours as needed for severe pain. 8 tablet 0   pantoprazole (PROTONIX) 40 MG tablet Take 1 tablet (40 mg total) by mouth daily. 90 tablet 3   Semaglutide,0.25 or 0.5MG /DOS, (OZEMPIC, 0.25 OR 0.5 MG/DOSE,) 2 MG/1.5ML SOPN Inject 0.5 mg into the skin once a week. 4.5 mL 3   [START ON 04/03/2021] Semaglutide-Weight Management 1 MG/0.5ML SOAJ Inject 1 mg into the skin once a week for 28 days. 2 mL 0   [START ON 05/02/2021] Semaglutide-Weight Management 1.7 MG/0.75ML SOAJ Inject 1.7 mg into the skin once a week for 28 days. 3 mL 0   [START ON 05/31/2021] Semaglutide-Weight Management 2.4 MG/0.75ML SOAJ Inject 2.4 mg into the skin once a week for 28 days. 3 mL 0   traMADol (ULTRAM) 50 MG tablet Take 50 mg by mouth every 6 (six) hours as needed.     triamcinolone (NASACORT) 55 MCG/ACT AERO nasal inhaler Place 2 sprays into the nose  daily. 1 each 12   zolpidem (AMBIEN) 10 MG tablet TAKE 1 TABLET BY MOUTH AT BEDTIME AS NEEDED FOR SLEEP. 90 tablet 1   [DISCONTINUED] famotidine (PEPCID) 20 MG tablet Take 20 mg by mouth 2 (two) times daily.     No current facility-administered medications on file prior to visit.        ROS:  All others reviewed and negative.  Objective        PE:  BP 116/72 (BP Location: Right  Arm, Patient Position: Sitting, Cuff Size: Large)   Pulse 85   Temp 98.2 F (36.8 C) (Oral)   Ht 5\' 11"  (1.803 m)   Wt (!) 344 lb (156 kg)   SpO2 96%   BMI 47.98 kg/m                 Constitutional: Pt appears in NAD               HENT: Head: NCAT.                Right Ear: External ear normal.                 Left Ear: External ear normal.                Eyes: . Pupils are equal, round, and reactive to light. Conjunctivae and EOM are normal               Nose: without d/c or deformity               Neck: Neck supple. Gross normal ROM               Cardiovascular: Normal rate and regular rhythm.                 Pulmonary/Chest: Effort normal and breath sounds without rales or wheezing.                Abd:  Soft, NT, ND, + BS, no organomegaly               Neurological: Pt is alert. At baseline orientation, motor grossly intact               Skin: Skin is warm. No rashes, no other new lesions, LE edema -none; + tender without swelling to the left lateral hip over the greater trochanter; also has 4th finger right hand moderate sized paronychia near nail bed with some fluctuance and surrounding erythema, but no drainage, red streaks or cellulitis               Psychiatric: Pt behavior is normal without agitation   Micro: none  Cardiac tracings I have personally interpreted today:  none  Pertinent Radiological findings (summarize): none   Lab Results  Component Value Date   WBC 6.5 03/23/2021   HGB 14.3 03/23/2021   HCT 42.0 03/23/2021   PLT 242.0 03/23/2021   GLUCOSE 86 03/23/2021   CHOL 149  03/23/2021   TRIG 84.0 03/23/2021   HDL 39.90 03/23/2021   LDLDIRECT 113.0 02/22/2018   LDLCALC 92 03/23/2021   ALT 18 03/23/2021   AST 20 03/23/2021   NA 139 03/23/2021   K 4.2 03/23/2021   CL 103 03/23/2021   CREATININE 0.72 03/23/2021   BUN 14 03/23/2021   CO2 30 03/23/2021   TSH 1.16 03/23/2021   HGBA1C 5.3 03/23/2021   Assessment/Plan:  Martha Thompson is a 36 y.o. White or Caucasian [1] female with  has a past medical history of ALLERGIC RHINITIS (10/30/2009), ANXIETY (09/04/2006), ASTHMA (12/25/2006), ASTHMA, WITH ACUTE EXACERBATION (05/03/2009), BLEPHARITIS, LEFT (05/21/2008), Cervical disc disease, COMMON MIGRAINE (06/14/2007), GANGLION CYST, WRIST, LEFT (10/11/2007), GERD (gastroesophageal reflux disease) (04/24/2012), Headache(784.0) (10/23/2008), HYPERLIPIDEMIA (06/14/2007), LOW BACK PAIN (09/04/2006), Morbid obesity (HCC) (09/04/2006), OTITIS MEDIA, ACUTE, LEFT (03/27/2008), SINUSITIS- ACUTE-NOS (10/11/2007), TENOSYNOVITIS, WRIST (10/11/2007), URI (10/29/2009), and URTICARIA (05/03/2009).  Encounter for well adult exam with abnormal findings Age and sex appropriate education and counseling updated  with regular exercise and diet Referrals for preventative services - for hep c screen Immunizations addressed - none needed Smoking counseling  - none needed Evidence for depression or other mood disorder - none significant Most recent labs reviewed. I have personally reviewed and have noted: 1) the patient's medical and social history 2) The patient's current medications and supplements - plans to continue ozempic for now 3) The patient's height, weight, and BMI have been recorded in the chart   Vitamin D deficiency Last vitamin D Lab Results  Component Value Date   VD25OH 47.61 03/23/2021   Stable, cont oral replacement   Hyperglycemia Lab Results  Component Value Date   HGBA1C 5.3 03/23/2021   Stable, pt to continue current medical treatment  - diet and  ozempic    HLD (hyperlipidemia) Lab Results  Component Value Date   LDLCALC 92 03/23/2021   Stable, pt to continue current low chol diet   Essential hypertension BP Readings from Last 3 Encounters:  03/23/21 116/72  03/17/21 (!) 146/88  03/17/21 (!) 166/101   Stable, pt to continue medical treatment losartan 25   Cyst of ovary, left With recent pain at last ED visit, not resolving; I encouraged her to definitely to call and f/u with GYN  Bursitis of left hip Mild overall today, declines sport med referral today but will do this if worsening  Paronychia of finger of right hand Mild to mod, for antibx course,  to f/u any worsening symptoms or concerns  Followup: Return in about 1 year (around 03/24/2022).  Oliver Barre, MD 03/26/2021 11:59 AM [facility]

## 2021-03-24 LAB — HEPATITIS C ANTIBODY
Hepatitis C Ab: NONREACTIVE
SIGNAL TO CUT-OFF: 0.26 (ref <1.00)

## 2021-03-26 ENCOUNTER — Encounter: Payer: Self-pay | Admitting: Internal Medicine

## 2021-03-26 DIAGNOSIS — N83202 Unspecified ovarian cyst, left side: Secondary | ICD-10-CM | POA: Insufficient documentation

## 2021-03-26 DIAGNOSIS — M7072 Other bursitis of hip, left hip: Secondary | ICD-10-CM | POA: Insufficient documentation

## 2021-03-26 DIAGNOSIS — L03011 Cellulitis of right finger: Secondary | ICD-10-CM | POA: Insufficient documentation

## 2021-03-26 NOTE — Assessment & Plan Note (Signed)
Lab Results  ?Component Value Date  ? LDLCALC 92 03/23/2021  ? ?Stable, pt to continue current low chol diet ? ?

## 2021-03-26 NOTE — Assessment & Plan Note (Signed)
Mild to mod, for antibx course,  to f/u any worsening symptoms or concerns 

## 2021-03-26 NOTE — Assessment & Plan Note (Signed)
With recent pain at last ED visit, not resolving; I encouraged her to definitely to call and f/u with GYN ?

## 2021-03-26 NOTE — Assessment & Plan Note (Signed)
Last vitamin D ?Lab Results  ?Component Value Date  ? VD25OH 47.61 03/23/2021  ? ?Stable, cont oral replacement ? ?

## 2021-03-26 NOTE — Assessment & Plan Note (Addendum)
Lab Results  ?Component Value Date  ? HGBA1C 5.3 03/23/2021  ? ?Stable, pt to continue current medical treatment  - diet and ozempic ? ? ?

## 2021-03-26 NOTE — Assessment & Plan Note (Addendum)
Age and sex appropriate education and counseling updated with regular exercise and diet ?Referrals for preventative services - for hep c screen ?Immunizations addressed - none needed ?Smoking counseling  - none needed ?Evidence for depression or other mood disorder - none significant ?Most recent labs reviewed. ?I have personally reviewed and have noted: ?1) the patient's medical and social history ?2) The patient's current medications and supplements - plans to continue ozempic for now ?3) The patient's height, weight, and BMI have been recorded in the chart ? ?

## 2021-03-26 NOTE — Assessment & Plan Note (Signed)
Mild overall today, declines sport med referral today but will do this if worsening ?

## 2021-03-26 NOTE — Assessment & Plan Note (Signed)
BP Readings from Last 3 Encounters:  ?03/23/21 116/72  ?03/17/21 (!) 146/88  ?03/17/21 (!) 166/101  ? ?Stable, pt to continue medical treatment losartan 25 ? ?

## 2021-03-27 ENCOUNTER — Other Ambulatory Visit: Payer: Self-pay | Admitting: Internal Medicine

## 2021-04-01 ENCOUNTER — Encounter: Payer: Self-pay | Admitting: Internal Medicine

## 2021-04-01 ENCOUNTER — Other Ambulatory Visit: Payer: Self-pay | Admitting: Internal Medicine

## 2021-04-10 MED ORDER — SEMAGLUTIDE-WEIGHT MANAGEMENT 1 MG/0.5ML ~~LOC~~ SOAJ: 1.0000 mg | SUBCUTANEOUS | 3 refills | Status: DC

## 2021-04-12 ENCOUNTER — Other Ambulatory Visit: Payer: Self-pay | Admitting: Internal Medicine

## 2021-04-12 MED ORDER — SEMAGLUTIDE (1 MG/DOSE) 4 MG/3ML ~~LOC~~ SOPN: 1.0000 mg | PEN_INJECTOR | SUBCUTANEOUS | 3 refills | Status: DC

## 2021-04-12 NOTE — Addendum Note (Signed)
Addended by: Biagio Borg on: 04/12/2021 04:55 PM ? ? Modules accepted: Orders ? ?

## 2021-05-03 ENCOUNTER — Emergency Department: Payer: 59

## 2021-05-03 ENCOUNTER — Emergency Department
Admission: EM | Admit: 2021-05-03 | Discharge: 2021-05-03 | Disposition: A | Discharge status: Home / Self Care | Payer: 59 | Attending: Emergency Medicine | Admitting: Emergency Medicine

## 2021-05-03 ENCOUNTER — Other Ambulatory Visit: Payer: Self-pay

## 2021-05-03 DIAGNOSIS — R002 Palpitations: Secondary | ICD-10-CM | POA: Diagnosis not present

## 2021-05-03 DIAGNOSIS — R0789 Other chest pain: Secondary | ICD-10-CM | POA: Insufficient documentation

## 2021-05-03 DIAGNOSIS — M25512 Pain in left shoulder: Secondary | ICD-10-CM | POA: Diagnosis not present

## 2021-05-03 DIAGNOSIS — R079 Chest pain, unspecified: Secondary | ICD-10-CM

## 2021-05-03 LAB — BASIC METABOLIC PANEL
Anion gap: 9 (ref 5–15)
BUN: 18 mg/dL (ref 6–20)
CO2: 25 mmol/L (ref 22–32)
Calcium: 9.3 mg/dL (ref 8.9–10.3)
Chloride: 104 mmol/L (ref 98–111)
Creatinine, Ser: 0.8 mg/dL (ref 0.44–1.00)
GFR, Estimated: >60 mL/min (ref >60)
Glucose, Bld: 102 mg/dL — ABNORMAL HIGH (ref 70–99)
Potassium: 3.7 mmol/L (ref 3.5–5.1)
Sodium: 138 mmol/L (ref 135–145)

## 2021-05-03 LAB — CBC
HCT: 40.2 % (ref 36.0–46.0)
Hemoglobin: 13.4 g/dL (ref 12.0–15.0)
MCH: 30.9 pg (ref 26.0–34.0)
MCHC: 33.3 g/dL (ref 30.0–36.0)
MCV: 92.6 fL (ref 80.0–100.0)
Platelets: 219 10*3/uL (ref 150–400)
RBC: 4.34 MIL/uL (ref 3.87–5.11)
RDW: 11.4 % — ABNORMAL LOW (ref 11.5–15.5)
WBC: 7.1 10*3/uL (ref 4.0–10.5)
nRBC: 0 % (ref 0.0–0.2)

## 2021-05-03 LAB — POC URINE PREG, ED: Preg Test, Ur: NEGATIVE

## 2021-05-03 LAB — TROPONIN I (HIGH SENSITIVITY): Troponin I (High Sensitivity): 3 ng/L (ref <18)

## 2021-05-03 NOTE — ED Provider Notes (Signed)
? ?[facility] ?Provider Note ? ? ? Event Date/Time  ? First MD Initiated Contact with Patient 05/03/21 1800   ?  (approximate) ? ?History  ? ?Chief Complaint: Chest Pain ? ?HPI ? ?Martha Thompson is a 36 y.o. female with a past medical history of anxiety, gastric reflux, presents to the emergency department for chest discomfort.  According to the patient approximately 2 to 3 hours before presenting to the emergency department she developed acute onset of palpitations and chest tightness.  Patient states central chest tightness feeling like her heart was skipping beats and felt a pain into the left shoulder.  Patient states she became worried, she took one of her Xanax tablets that she is prescribed for anxiety but continued to have symptoms so she came to the emergency department for evaluation.  Patient states she did use her pulse oximeter at home and it had dropped into the 80s.  Upon arrival patient's vitals are reassuring including 98% room air saturation normal pulse rate and respiratory rate.  Afebrile.  Patient states mild chest tightness currently.  No leg pain or swelling, no pleuritic pain. ? ?Physical Exam  ? ?Triage Vital Signs: ?ED Triage Vitals  ?Enc Vitals Group  ?   BP 05/03/21 1749 (!) 143/62  ?   Pulse Rate 05/03/21 1749 82  ?   Resp 05/03/21 1749 18  ?   Temp 05/03/21 1749 98.1 ?F (36.7 ?C)  ?   Temp Source 05/03/21 1749 Oral  ?   SpO2 05/03/21 1749 98 %  ?   Weight 05/03/21 1745 300 lb (136.1 kg)  ?   Height 05/03/21 1745 5\' 11"  (1.803 m)  ?   Head Circumference --   ?   Peak Flow --   ?   Pain Score 05/03/21 1745 6  ?   Pain Loc --   ?   Pain Edu? --   ?   Excl. in GC? --   ? ? ?Most recent vital signs: ?Vitals:  ? 05/03/21 1749  ?BP: (!) 143/62  ?Pulse: 82  ?Resp: 18  ?Temp: 98.1 ?F (36.7 ?C)  ?SpO2: 98%  ? ? ?General: Awake, no distress.  ?CV:  Good peripheral perfusion.  Regular rate and rhythm  ?Resp:  Normal effort.  Equal breath sounds bilaterally.  ?Abd:  No  distention.  Soft, nontender.  No rebound or guarding. ? ? ? ?ED Results / Procedures / Treatments  ? ?EKG ? ?EKG viewed and interpreted by myself shows a normal sinus rhythm at 93 bpm with a narrow QRS, normal axis, normal intervals, no concerning ST changes.  Reassuring EKG. ? ?RADIOLOGY ? ?I personally reviewed the chest x-ray images, no concerning findings on my evaluation. ?Radiology is read the chest x-ray is negative. ? ? ?MEDICATIONS ORDERED IN ED: ?Medications - No data to display ? ? ?IMPRESSION / MDM / ASSESSMENT AND PLAN / ED COURSE  ?I reviewed the triage vital signs and the nursing notes. ? ?Patient presents to the emergency department for central chest discomfort and palpitations.  Overall the patient appears well, no leg pain or swelling no pleuritic pain, normal vitals with a normal pulse rate and respiratory rate.  Patient states her symptoms have improved since taking Xanax but still has mild chest tightness.  Patient's lab work is reassuring including a negative troponin.  Normal CBC and a reassuring chemistry.  Chest x-ray is clear and EKG is reassuring.  Given the patient's reassuring work-up reassuring physical exam  I believe the patient will be safe for discharge home with PCP follow-up.  I have discussed this with the patient who is reassured by today's work-up and she is agreeable to this plan.  Provided my normal chest pain return precautions. ? ?FINAL CLINICAL IMPRESSION(S) / ED DIAGNOSES  ? ?Chest pain ?Palpitations ? ? ?Note:  This document was prepared using Dragon voice recognition software and may include unintentional dictation errors. ?  ?Minna Antis, MD ?05/03/21 1901 ? ?

## 2021-05-03 NOTE — ED Triage Notes (Signed)
Pt to ED via POV from work. Pt reports she was finishing up with a client and started having a sudden onset of central CP that radiates from left neck to left arm. Pt denies cardiac hx. Pt reports hx anxiety and took a xanax. Pt reports  increased burping today and dizziness. Pt denies blood thinners.  ? ?Pt states she was watching her oxygen and pulse ox at home and her oxygen dropped to 80s and HR dropped. Pt also states she was at rest in truck and HR increased to 130s.  ?

## 2021-07-11 ENCOUNTER — Other Ambulatory Visit: Payer: Self-pay | Admitting: Internal Medicine

## 2021-10-11 ENCOUNTER — Other Ambulatory Visit: Payer: Self-pay | Admitting: Internal Medicine

## 2022-01-12 ENCOUNTER — Other Ambulatory Visit: Payer: Self-pay | Admitting: Internal Medicine

## 2022-01-13 ENCOUNTER — Ambulatory Visit
Admission: EM | Admit: 2022-01-13 | Discharge: 2022-01-13 | Disposition: A | Discharge status: Home / Self Care | Payer: 59

## 2022-01-13 DIAGNOSIS — H1031 Unspecified acute conjunctivitis, right eye: Secondary | ICD-10-CM

## 2022-01-13 DIAGNOSIS — J019 Acute sinusitis, unspecified: Secondary | ICD-10-CM

## 2022-01-13 DIAGNOSIS — B9689 Other specified bacterial agents as the cause of diseases classified elsewhere: Secondary | ICD-10-CM | POA: Diagnosis not present

## 2022-01-13 LAB — POCT RAPID STREP A (OFFICE): Rapid Strep A Screen: NEGATIVE

## 2022-01-13 MED ORDER — AMOXICILLIN-POT CLAVULANATE 875-125 MG PO TABS: 1.0000 | ORAL_TABLET | Freq: Two times a day (BID) | ORAL | 0 refills | Status: DC

## 2022-01-13 MED ORDER — PREDNISONE 50 MG PO TABS: 50.0000 mg | ORAL_TABLET | Freq: Every day | ORAL | 0 refills | Status: AC

## 2022-01-13 MED ORDER — POLYMYXIN B-TRIMETHOPRIM 10000-0.1 UNIT/ML-% OP SOLN: 1.0000 [drp] | Freq: Four times a day (QID) | OPHTHALMIC | 0 refills | Status: AC

## 2022-01-13 NOTE — ED Provider Notes (Signed)
Martha Thompson    CSN: [id number] Arrival date & time: 01/13/22  4010      History   Chief Complaint Chief Complaint  Patient presents with   Eye Drainage   Cough   Sore Throat    HPI Martha Thompson is a 37 y.o. female.    Cough Sore Throat    Presents To urgent care with multiple complaints including right eye drainage that started this morning, sore throat for the past 4 to 5 days along with productive cough, and sinus pain/pressure x 2 weeks.  She reports brown/green sputum from the cough.  She reports recent infection with COVID.  States her right eye is matted shut in the morning but denies any other exudate so far throughout the day.  Past Medical History:  Diagnosis Date   ALLERGIC RHINITIS 10/30/2009   ANXIETY 09/04/2006   ASTHMA 12/25/2006   inhaler used 2 days ago   ASTHMA, WITH ACUTE EXACERBATION 05/03/2009   BLEPHARITIS, LEFT 05/21/2008   Cervical disc disease    2 bulging discs to neck    COMMON MIGRAINE 06/14/2007   GANGLION CYST, WRIST, LEFT 10/11/2007   GERD (gastroesophageal reflux disease) 04/24/2012   Headache(784.0) 10/23/2008   HYPERLIPIDEMIA 06/14/2007   LOW BACK PAIN 09/04/2006   Morbid obesity (HCC) 09/04/2006   OTITIS MEDIA, ACUTE, LEFT 03/27/2008   SINUSITIS- ACUTE-NOS 10/11/2007   TENOSYNOVITIS, WRIST 10/11/2007   URI 10/29/2009   URTICARIA 05/03/2009    Patient Active Problem List   Diagnosis Date Noted   Cyst of ovary, left 03/26/2021   Bursitis of left hip 03/26/2021   Paronychia of finger of right hand 03/26/2021   RUQ pain 02/04/2021   Hypersomnolence 02/04/2021   Vitamin D deficiency 03/22/2020   Asthma exacerbation 03/07/2019   Acute appendicitis 12/09/2018   STD exposure 02/19/2017   Cough 08/08/2016   Chronic pain of left knee 08/08/2016   Insomnia 02/08/2016   Acute left lumbar radiculopathy 09/16/2015   Hyperglycemia 08/13/2015   GERD (gastroesophageal reflux disease) 04/24/2012   Wheezing 04/17/2011    Encounter for well adult exam with abnormal findings 04/13/2010   Allergic rhinitis 10/30/2009   Acute upper respiratory infection 10/29/2009   DISC DISEASE, CERVICAL 10/29/2009   Hives 05/03/2009   Essential hypertension 10/11/2007   HLD (hyperlipidemia) 06/14/2007   Asthma 12/25/2006   Morbid obesity (HCC) 09/04/2006   Generalized anxiety disorder 09/04/2006   Anxiety with depression 09/04/2006   LOW BACK PAIN 09/04/2006    Past Surgical History:  Procedure Laterality Date   back surgury  01/2003   s/p lumbar disc   CESAREAN SECTION N/A 02/19/2013   Procedure: CESAREAN SECTION;  Surgeon: Michael Litter, MD;  Location: [facility];  Service: Obstetrics;  Laterality: N/A;   LAPAROSCOPIC APPENDECTOMY N/A 12/09/2018   Procedure: APPENDECTOMY LAPAROSCOPIC;  Surgeon: Sung Amabile, DO;  Location: [facility];  Service: General;  Laterality: N/A;   LAPAROSCOPY  04/07/2011   Procedure: LAPAROSCOPY OPERATIVE;  Surgeon: Meriel Pica, MD;  Location: [facility];  Service: Gynecology;  Laterality: N/A;  left salpingogectomy    OB History     Gravida  3   Para  1   Term  1   Preterm      AB  2   Living  1      SAB  1   IAB      Ectopic  1   Multiple      Live Births  1  Home Medications    Prior to Admission medications   Medication Sig Start Date End Date Taking? Authorizing Provider  doxycycline (MONODOX) 100 MG capsule Take 100 mg by mouth 2 (two) times daily. 12/17/21  Yes [provider]  albuterol (VENTOLIN HFA) 108 (90 Base) MCG/ACT inhaler INHALE 2 PUFFS INTO THE LUNGS EVERY 6 HOURS AS NEEDED 10/13/20   Biagio Borg, MD  ALPRAZolam Duanne Moron) 1 MG tablet TAKE 1 TABLET BY MOUTH THREE TIMES A DAY AS NEEDED FOR ANXIETY 01/12/22   Biagio Borg, MD  amoxicillin-clavulanate (AUGMENTIN) 875-125 MG tablet SMARTSIG:1 Tablet(s) By Mouth Every 12 Hours    [provider]  cetirizine (ZYRTEC) 5 MG tablet Take 10 mg by mouth 2 (two) times daily.     [provider]  doxycycline (VIBRA-TABS) 100 MG tablet Take 1 tablet (100 mg total) by mouth 2 (two) times daily. 03/23/21   Biagio Borg, MD  EPINEPHrine 0.3 mg/0.3 mL IJ SOAJ injection Inject 0.3 mg into the muscle as needed. 07/25/18   [provider]  fluconazole (DIFLUCAN) 150 MG tablet Take by mouth.    [provider]  fluticasone (FLONASE) 50 MCG/ACT nasal spray Place 1-2 sprays into both nostrils daily. 01/12/20   Wieters, Hallie C, PA-C  Fluticasone-Salmeterol (ADVAIR) 100-50 MCG/DOSE AEPB Inhale 1 puff into the lungs 2 (two) times daily. 03/07/19   Biagio Borg, MD  hydroxychloroquine (PLAQUENIL) 200 MG tablet Take 200 mg by mouth 2 (two) times daily. 07/28/18   [provider]  hydrOXYzine (ATARAX) 50 MG tablet Take 100 mg by mouth at bedtime. 10/08/20   [provider]  ibuprofen (ADVIL) 800 MG tablet Take 1 tablet (800 mg total) by mouth every 8 (eight) hours as needed for cramping or moderate pain. 03/17/21   Duffy Bruce, MD  levonorgestrel (MIRENA, 52 MG,) 20 MCG/DAY IUD Mirena 20 mcg/24 hours (7 yrs) 52 mg intrauterine device  Take 1 device by intrauterine route as directed.    [provider]  losartan (COZAAR) 25 MG tablet Take 1 tablet (25 mg total) by mouth daily. 03/04/21   Biagio Borg, MD  meloxicam (MOBIC) 15 MG tablet TAKE 1 TABLET BY MOUTH EVERY DAY AS NEEDED FOR PAIN 03/04/21   Biagio Borg, MD  naproxen (NAPROSYN) 500 MG tablet Take 1 tablet (500 mg total) by mouth 2 (two) times daily. 01/12/20   Wieters, Hallie C, PA-C  omalizumab Arvid Right) 150 MG/ML prefilled syringe Inject 300 mg into the skin See admin instructions. INJECT 2 (150 MG) SYRINGES UNDER THE SKIN FOR A TOTAL DOSE OF 300MG  EVERY 4 WEEKS. 06/17/18   [provider]  ondansetron (ZOFRAN) 4 MG tablet Take 1 tablet (4 mg total) by mouth every 8 (eight) hours as needed for nausea or vomiting. 03/23/21   Biagio Borg, MD  oxyCODONE (ROXICODONE) 5 MG immediate  release tablet Take 1 tablet (5 mg total) by mouth every 6 (six) hours as needed for severe pain. 03/17/21 03/17/22  Duffy Bruce, MD  pantoprazole (PROTONIX) 40 MG tablet Take 1 tablet (40 mg total) by mouth daily. 02/04/21   Biagio Borg, MD  promethazine (PHENERGAN) 25 MG tablet Take 1 tablet (25 mg total) by mouth every 8 (eight) hours as needed for nausea or vomiting. 03/23/21   Biagio Borg, MD  Semaglutide, 1 MG/DOSE, 4 MG/3ML SOPN Inject 1 mg as directed once a week. 04/12/21   Biagio Borg, MD  traMADol Veatrice Bourbon) 50 MG tablet Take  50 mg by mouth every 6 (six) hours as needed. 02/04/20   [provider]  triamcinolone (NASACORT) 55 MCG/ACT AERO nasal inhaler Place 2 sprays into the nose daily. 02/04/21   Biagio Borg, MD  zolpidem (AMBIEN) 10 MG tablet TAKE 1 TABLET BY MOUTH EVERY DAY AT BEDTIME AS NEEDED FOR SLEEP 04/02/21   Biagio Borg, MD  famotidine (PEPCID) 20 MG tablet Take 20 mg by mouth 2 (two) times daily. 08/15/18 01/12/20  [provider]    Family History Family History  Problem Relation Age of Onset   Anxiety disorder Father    Diabetes Father     Social History Social History   Tobacco Use   Smoking status: Former    Packs/day: 0.50    Types: Cigarettes   Smokeless tobacco: Never  Vaping Use   Vaping Use: Every day  Substance Use Topics   Alcohol use: Yes    Alcohol/week: 0.0 standard drinks of alcohol    Comment: rarely   Drug use: No     Allergies   Prozac [fluoxetine hcl]   Review of Systems Review of Systems  Respiratory:  Positive for cough.      Physical Exam Triage Vital Signs ED Triage Vitals [01/13/22 0849]  Enc Vitals Group     BP (!) 156/102     Pulse Rate 94     Resp 18     Temp 98.5 F (36.9 C)     Temp src      SpO2 96 %     Weight      Height      Head Circumference      Peak Flow      Pain Score 0     Pain Loc      Pain Edu?      Excl. in [place]?    No data found.  Updated Vital Signs BP (!) 156/102    Pulse 94   Temp 98.5 F (36.9 C)   Resp 18   SpO2 96%   Visual Acuity Right Eye Distance:   Left Eye Distance:   Bilateral Distance:    Right Eye Near:   Left Eye Near:    Bilateral Near:     Physical Exam Vitals reviewed.  Constitutional:      Appearance: She is well-developed. She is ill-appearing.  Eyes:     General:        Right eye: Discharge present.     Conjunctiva/sclera:     Right eye: Right conjunctiva is injected. No exudate.    Comments: Clear discharge from the R eye  Cardiovascular:     Rate and Rhythm: Normal rate and regular rhythm.     Heart sounds: Normal heart sounds.  Pulmonary:     Effort: Pulmonary effort is normal.     Breath sounds: Normal breath sounds.  Skin:    General: Skin is warm.  Neurological:     General: No focal deficit present.     Mental Status: She is alert and oriented to person, place, and time.  Psychiatric:        Mood and Affect: Mood normal.        Behavior: Behavior normal.      UC Treatments / Results  Labs (all labs ordered are listed, but only abnormal results are displayed) Labs Reviewed - No data to display  EKG   Radiology No results found.  Procedures Procedures (including critical care time)  Medications Ordered in  UC Medications - No data to display  Initial Impression / Assessment and Plan / UC Course  I have reviewed the triage vital signs and the nursing notes.  Pertinent labs & imaging results that were available during my care of the patient were reviewed by me and considered in my medical decision making (see chart for details).   Patient is afebrile here without recent antipyretics. Satting well on room air. Overall is ill appearing, well hydrated, without respiratory distress. Pulmonary exam is unremarkable.  Lungs CTAB without wheezing, rhonchi, rales.  Her pharynx is erythematous but without presence of peritonsillar exudates.  Right eye has clear drainage.  There is no evidence of current  purulent discharge.  She has right sided maxillary sinus tenderness  Suspect bacterial sinusitis secondary to past URI.  Rapid strep was obtained and is negative however any strep burden in her pharynx would be covered by the antibiotic we will order for the sinusitis.  Also will order an course of oral steroids which she states she has tolerated in the past with only some sleep disruption at night.  Counseled her to take the full dose of steroids in the morning to minimize sleep disruption and to use Benadryl or NyQuil or other nighttime sleep aid if she does experience difficulty sleeping.  She will use OTC medication for symptom control.  Lastly while I suspect viral conjunctivitis, will order antibiotic drops for her to use should she develop purulent discharge.  Final Clinical Impressions(s) / UC Diagnoses   Final diagnoses:  None   Discharge Instructions   None    ED Prescriptions   None    PDMP not reviewed this encounter.   Charma Igo, [place] 01/13/22 [phone]

## 2022-01-13 NOTE — Discharge Instructions (Signed)
I prescribed an antibiotic for your symptoms of sinusitis.  Should you have a strep infection (your rapid strep today was negative) this antibiotic would treat that as well.  I have also prescribed an antibiotic eyedrop for you to use only if you should develop pussy discharge from your right eye.  We recommend you use over-the-counter medications for pain control including acetaminophen (Tylenol), ibuprofen (Advil/Motrin) or naproxen (Aleve) for your sore throat.   Room humidifiers are helpful to ease breathing at night. I recommend guaifenesin (Mucinex) with plenty of water throughout the day to help thin and loosen mucus secretions in your respiratory passages including your chest.   If appropriate based upon your other medical problems, you might also find relief of nasal/sinus congestion symptoms by using a nasal decongestant such as Flonase (fluticasone) or Sudafed sinus (pseudoephedrine).  You will need to obtain Sudafed from behind the pharmacist counter.  Speak to the pharmacist to verify that you are not duplicating medications with other over-the-counter formulations that you may be using.   Follow up here or with your primary care provider if your symptoms are worsening or not improving with treatment.

## 2022-01-13 NOTE — ED Triage Notes (Signed)
Pt. Presents to UC w/ c/o right eye drainage  that started this morning, a sore throat for the past 4-5 days and a productive cough. Pt. Endorses brown/green phlegm. Pt. Also mentions recently getting over [place].

## 2022-01-27 ENCOUNTER — Other Ambulatory Visit: Payer: Self-pay

## 2022-01-27 ENCOUNTER — Other Ambulatory Visit: Payer: Self-pay | Admitting: Internal Medicine

## 2022-01-27 NOTE — Telephone Encounter (Signed)
Please refill as per office routine med refill policy (all routine meds to be refilled for 3 mo or monthly (per pt preference) up to one year from last visit, then month to month grace period for 3 mo, then further med refills will have to be denied)

## 2022-03-02 ENCOUNTER — Encounter: Payer: Self-pay | Admitting: Internal Medicine

## 2022-03-02 MED ORDER — LOSARTAN POTASSIUM 25 MG PO TABS: 25.0000 mg | ORAL_TABLET | Freq: Every day | ORAL | 0 refills | Status: DC

## 2022-03-02 MED ORDER — MELOXICAM 15 MG PO TABS: ORAL_TABLET | ORAL | 0 refills | Status: DC

## 2022-03-29 ENCOUNTER — Other Ambulatory Visit: Payer: Self-pay | Admitting: Internal Medicine

## 2022-03-31 ENCOUNTER — Ambulatory Visit (INDEPENDENT_AMBULATORY_CARE_PROVIDER_SITE_OTHER): Payer: Managed Care, Other (non HMO) | Admitting: Internal Medicine

## 2022-03-31 ENCOUNTER — Encounter: Payer: Self-pay | Admitting: Internal Medicine

## 2022-03-31 ENCOUNTER — Other Ambulatory Visit: Payer: Self-pay | Admitting: Internal Medicine

## 2022-03-31 ENCOUNTER — Ambulatory Visit: Payer: Managed Care, Other (non HMO)

## 2022-03-31 ENCOUNTER — Encounter: Payer: Self-pay | Admitting: Cardiology

## 2022-03-31 ENCOUNTER — Ambulatory Visit: Payer: Managed Care, Other (non HMO) | Attending: Cardiology | Admitting: Cardiology

## 2022-03-31 VITALS — BP 147/96 | HR 98 | Ht 71.0 in | Wt 359.5 lb

## 2022-03-31 VITALS — BP 120/74 | HR 100 | Temp 98.1°F | Ht 71.0 in | Wt 358.0 lb

## 2022-03-31 DIAGNOSIS — R300 Dysuria: Secondary | ICD-10-CM | POA: Diagnosis not present

## 2022-03-31 DIAGNOSIS — R739 Hyperglycemia, unspecified: Secondary | ICD-10-CM

## 2022-03-31 DIAGNOSIS — E559 Vitamin D deficiency, unspecified: Secondary | ICD-10-CM

## 2022-03-31 DIAGNOSIS — O320XX0 Maternal care for unstable lie, not applicable or unspecified: Secondary | ICD-10-CM | POA: Insufficient documentation

## 2022-03-31 DIAGNOSIS — E7800 Pure hypercholesterolemia, unspecified: Secondary | ICD-10-CM | POA: Diagnosis not present

## 2022-03-31 DIAGNOSIS — E538 Deficiency of other specified B group vitamins: Secondary | ICD-10-CM

## 2022-03-31 DIAGNOSIS — N762 Acute vulvitis: Secondary | ICD-10-CM | POA: Insufficient documentation

## 2022-03-31 DIAGNOSIS — N898 Other specified noninflammatory disorders of vagina: Secondary | ICD-10-CM | POA: Insufficient documentation

## 2022-03-31 DIAGNOSIS — B3731 Acute candidiasis of vulva and vagina: Secondary | ICD-10-CM | POA: Insufficient documentation

## 2022-03-31 DIAGNOSIS — R001 Bradycardia, unspecified: Secondary | ICD-10-CM | POA: Diagnosis not present

## 2022-03-31 DIAGNOSIS — Z8759 Personal history of other complications of pregnancy, childbirth and the puerperium: Secondary | ICD-10-CM | POA: Insufficient documentation

## 2022-03-31 DIAGNOSIS — Z0001 Encounter for general adult medical examination with abnormal findings: Secondary | ICD-10-CM | POA: Diagnosis not present

## 2022-03-31 DIAGNOSIS — I10 Essential (primary) hypertension: Secondary | ICD-10-CM

## 2022-03-31 DIAGNOSIS — I495 Sick sinus syndrome: Secondary | ICD-10-CM | POA: Diagnosis not present

## 2022-03-31 DIAGNOSIS — H6992 Unspecified Eustachian tube disorder, left ear: Secondary | ICD-10-CM

## 2022-03-31 LAB — CBC WITH DIFFERENTIAL/PLATELET
Basophils Absolute: 0 10*3/uL (ref 0.0–0.1)
Basophils Relative: 0.4 % (ref 0.0–3.0)
Eosinophils Absolute: 0.2 10*3/uL (ref 0.0–0.7)
Eosinophils Relative: 2.1 % (ref 0.0–5.0)
HCT: 41.9 % (ref 36.0–46.0)
Hemoglobin: 14.4 g/dL (ref 12.0–15.0)
Lymphocytes Relative: 33.3 % (ref 12.0–46.0)
Lymphs Abs: 2.7 10*3/uL (ref 0.7–4.0)
MCHC: 34.4 g/dL (ref 30.0–36.0)
MCV: 91.9 fl (ref 78.0–100.0)
Monocytes Absolute: 0.6 10*3/uL (ref 0.1–1.0)
Monocytes Relative: 7.5 % (ref 3.0–12.0)
Neutro Abs: 4.6 10*3/uL (ref 1.4–7.7)
Neutrophils Relative %: 56.7 % (ref 43.0–77.0)
Platelets: 241 10*3/uL (ref 150.0–400.0)
RBC: 4.56 Mil/uL (ref 3.87–5.11)
RDW: 13 % (ref 11.5–15.5)
WBC: 8.2 10*3/uL (ref 4.0–10.5)

## 2022-03-31 LAB — URINALYSIS, ROUTINE W REFLEX MICROSCOPIC
Bilirubin Urine: NEGATIVE
Hgb urine dipstick: NEGATIVE
Ketones, ur: NEGATIVE
Leukocytes,Ua: NEGATIVE
Nitrite: NEGATIVE
Specific Gravity, Urine: 1.015 (ref 1.000–1.030)
Urine Glucose: NEGATIVE
Urobilinogen, UA: 0.2 (ref 0.0–1.0)
pH: 7 (ref 5.0–8.0)

## 2022-03-31 LAB — LIPID PANEL
Cholesterol: 170 mg/dL (ref 0–200)
HDL: 41.3 mg/dL (ref >39.00)
LDL Cholesterol: 104 mg/dL — ABNORMAL HIGH (ref 0–99)
NonHDL: 129.1
Total CHOL/HDL Ratio: 4
Triglycerides: 126 mg/dL (ref 0.0–149.0)
VLDL: 25.2 mg/dL (ref 0.0–40.0)

## 2022-03-31 LAB — BASIC METABOLIC PANEL
BUN: 18 mg/dL (ref 6–23)
CO2: 28 mEq/L (ref 19–32)
Calcium: 9.6 mg/dL (ref 8.4–10.5)
Chloride: 103 mEq/L (ref 96–112)
Creatinine, Ser: 0.72 mg/dL (ref 0.40–1.20)
GFR: 108.04 mL/min (ref >60.00)
Glucose, Bld: 93 mg/dL (ref 70–99)
Potassium: 4.1 mEq/L (ref 3.5–5.1)
Sodium: 140 mEq/L (ref 135–145)

## 2022-03-31 LAB — HEPATIC FUNCTION PANEL
ALT: 15 U/L (ref 0–35)
AST: 15 U/L (ref 0–37)
Albumin: 4.3 g/dL (ref 3.5–5.2)
Alkaline Phosphatase: 44 U/L (ref 39–117)
Bilirubin, Direct: 0.1 mg/dL (ref 0.0–0.3)
Total Bilirubin: 0.5 mg/dL (ref 0.2–1.2)
Total Protein: 7.4 g/dL (ref 6.0–8.3)

## 2022-03-31 LAB — TSH: TSH: 1.57 u[IU]/mL (ref 0.35–5.50)

## 2022-03-31 LAB — VITAMIN D 25 HYDROXY (VIT D DEFICIENCY, FRACTURES): VITD: 31.8 ng/mL (ref 30.00–100.00)

## 2022-03-31 LAB — HEMOGLOBIN A1C: Hgb A1c MFr Bld: 5.6 % (ref 4.6–6.5)

## 2022-03-31 LAB — VITAMIN B12: Vitamin B-12: 301 pg/mL (ref 211–911)

## 2022-03-31 MED ORDER — ALBUTEROL SULFATE HFA 108 (90 BASE) MCG/ACT IN AERS
2.0000 | INHALATION_SPRAY | Freq: Four times a day (QID) | RESPIRATORY_TRACT | 2 refills | Status: AC | PRN
Start: 2022-03-31 — End: ?

## 2022-03-31 MED ORDER — EPINEPHRINE 0.3 MG/0.3ML IJ SOAJ: 0.3000 mg | INTRAMUSCULAR | 5 refills | Status: AC | PRN

## 2022-03-31 MED ORDER — CEPHALEXIN 500 MG PO CAPS: 500.0000 mg | ORAL_CAPSULE | Freq: Three times a day (TID) | ORAL | 0 refills | Status: DC

## 2022-03-31 NOTE — Progress Notes (Unsigned)
Patient ID: Martha Thompson, female   DOB: [birth year], 37 y.o.   MRN: [id number]         Chief Complaint:: wellness exam and Annual Exam (May have a uti , having pain in lower stomach and pain when urinating also has some burning and having frequency )  , tachycardia bradycardia, allergies and left ear discomfort       HPI:  Martha Thompson is a 37 y.o. female here for wellness exam; up to date                        Also feels "weird" at times with apple watch with HR 38 at times with fatigue, does not want to talk, lightheaded, then gets anxious.Sometimes HR has been up to 145.  Did try ozempic about April 2023 but too much GI pain so stopped after 2 mo, but also had an ovary cyst rupture episode in the fall 2023.  Today c/o dysuria x 3-4 days, but Denies urinary symptoms such as frequency, urgency, flank pain, hematuria or n/v, fever, chills.  Does have several wks ongoing nasal allergy symptoms with clearish congestion, itch and sneezing, without fever, pain, ST, cough, swelling or wheezing but with left ear popping and crackling and mild discomfort.      Wt Readings from Last 3 Encounters:  03/31/22 (!) 359 lb 8 oz (163.1 kg)  03/31/22 (!) 358 lb (162.4 kg)  05/03/21 300 lb (136.1 kg)   BP Readings from Last 3 Encounters:  03/31/22 (!) 147/96  03/31/22 120/74  01/13/22 (!) 156/102   Immunization History  Administered Date(s) Administered   Influenza Whole 11/12/2006, 10/29/2009   Influenza,inj,Quad PF,6+ Mos 11/14/2012, 02/22/2018   Influenza-Unspecified 11/14/2012, 10/23/2018   Pneumococcal Polysaccharide-23 02/20/2013   Tdap 12/12/2012, 02/19/2017   There are no preventive care reminders to display for this patient.     Past Medical History:  Diagnosis Date   ALLERGIC RHINITIS 10/30/2009   ANXIETY 09/04/2006   ASTHMA 12/25/2006   inhaler used 2 days ago   ASTHMA, WITH ACUTE EXACERBATION 05/03/2009   BLEPHARITIS, LEFT 05/21/2008   Cervical disc disease    2  bulging discs to neck    COMMON MIGRAINE 06/14/2007   GANGLION CYST, WRIST, LEFT 10/11/2007   GERD (gastroesophageal reflux disease) 04/24/2012   Headache(784.0) 10/23/2008   HYPERLIPIDEMIA 06/14/2007   LOW BACK PAIN 09/04/2006   Morbid obesity ([place]) 09/04/2006   OTITIS MEDIA, ACUTE, LEFT 03/27/2008   SINUSITIS- ACUTE-NOS 10/11/2007   TENOSYNOVITIS, WRIST 10/11/2007   URI 10/29/2009   URTICARIA 05/03/2009   Past Surgical History:  Procedure Laterality Date   back surgury  01/2003   s/p lumbar disc   CESAREAN SECTION N/A 02/19/2013   Procedure: CESAREAN SECTION;  Surgeon: Betsy Coder, MD;  Location: [facility];  Service: Obstetrics;  Laterality: N/A;   LAPAROSCOPIC APPENDECTOMY N/A 12/09/2018   Procedure: APPENDECTOMY LAPAROSCOPIC;  Surgeon: Benjamine Sprague, DO;  Location: [facility];  Service: General;  Laterality: N/A;   LAPAROSCOPY  04/07/2011   Procedure: LAPAROSCOPY OPERATIVE;  Surgeon: Margarette Asal, MD;  Location: [facility];  Service: Gynecology;  Laterality: N/A;  left salpingogectomy    reports that she has quit smoking. Her smoking use included cigarettes. She has a 8.00 pack-year smoking history. She has never used smokeless tobacco. She reports current alcohol use. She reports that she does not use drugs. family history includes Anxiety disorder in her father; Diabetes in her father. Allergies  Allergen Reactions   Prozac [Fluoxetine Hcl] Other (See Comments)   Current Outpatient Medications on File Prior to Visit  Medication Sig Dispense Refill   ALPRAZolam (XANAX) 1 MG tablet TAKE 1 TABLET BY MOUTH THREE TIMES A DAY AS NEEDED FOR ANXIETY 90 tablet 2   cetirizine (ZYRTEC) 5 MG tablet Take 10 mg by mouth 2 (two) times daily.     fluticasone (FLONASE) 50 MCG/ACT nasal spray Place 1-2 sprays into both nostrils daily. 16 g 0   hydrOXYzine (ATARAX) 50 MG tablet Take 100 mg by mouth at bedtime.     ibuprofen (ADVIL) 800 MG tablet Take 1 tablet (800 mg total) by mouth every 8 (eight) hours as  needed for cramping or moderate pain. 20 tablet 0   levonorgestrel (MIRENA, 52 MG,) 20 MCG/DAY IUD Mirena 20 mcg/24 hours (7 yrs) 52 mg intrauterine device  Take 1 device by intrauterine route as directed.     losartan (COZAAR) 25 MG tablet Take 1 tablet (25 mg total) by mouth daily. Annual appt due in March must see provider for future refills 30 tablet 0   naproxen (NAPROSYN) 500 MG tablet Take 1 tablet (500 mg total) by mouth 2 (two) times daily. (Patient not taking: Reported on 03/31/2022) 30 tablet 0   omalizumab (XOLAIR) 150 MG/ML prefilled syringe Inject 300 mg into the skin See admin instructions. INJECT 2 (150 MG) SYRINGES UNDER THE SKIN FOR A TOTAL DOSE OF 300MG  EVERY 4 WEEKS.     pantoprazole (PROTONIX) 40 MG tablet TAKE 1 TABLET BY MOUTH EVERY DAY 90 tablet 0   promethazine (PHENERGAN) 25 MG tablet Take 1 tablet (25 mg total) by mouth every 8 (eight) hours as needed for nausea or vomiting. 30 tablet 1   amoxicillin-clavulanate (AUGMENTIN) 875-125 MG tablet Take 1 tablet by mouth every 12 (twelve) hours. (Patient not taking: Reported on 03/31/2022) 14 tablet 0   fluconazole (DIFLUCAN) 150 MG tablet Take by mouth. (Patient not taking: Reported on 03/31/2022)     Fluticasone-Salmeterol (ADVAIR) 100-50 MCG/DOSE AEPB Inhale 1 puff into the lungs 2 (two) times daily. (Patient not taking: Reported on 03/31/2022) 3 each 3   hydroxychloroquine (PLAQUENIL) 200 MG tablet Take 200 mg by mouth 2 (two) times daily. (Patient not taking: Reported on 03/31/2022)     meloxicam (MOBIC) 15 MG tablet TAKE 1 TABLET BY MOUTH EVERY DAY AS NEEDED FOR PAIN 30 tablet 0   ondansetron (ZOFRAN) 4 MG tablet Take 1 tablet (4 mg total) by mouth every 8 (eight) hours as needed for nausea or vomiting. (Patient not taking: Reported on 03/31/2022) 40 tablet 2   Semaglutide, 1 MG/DOSE, 4 MG/3ML SOPN Inject 1 mg as directed once a week. (Patient not taking: Reported on 03/31/2022) 9 mL 3   traMADol (ULTRAM) 50 MG tablet Take 50 mg by  mouth every 6 (six) hours as needed. (Patient not taking: Reported on 03/31/2022)     triamcinolone (NASACORT) 55 MCG/ACT AERO nasal inhaler Place 2 sprays into the nose daily. (Patient not taking: Reported on 03/31/2022) 1 each 12   zolpidem (AMBIEN) 10 MG tablet TAKE 1 TABLET BY MOUTH EVERY DAY AT BEDTIME AS NEEDED FOR SLEEP (Patient not taking: Reported on 03/31/2022) 90 tablet 1   [DISCONTINUED] famotidine (PEPCID) 20 MG tablet Take 20 mg by mouth 2 (two) times daily.     No current facility-administered medications on file prior to visit.        ROS:  All others reviewed and negative.  Objective  PE:  BP 120/74 (BP Location: Right Arm, Patient Position: Sitting, Cuff Size: Normal)   Pulse 100   Temp 98.1 F (36.7 C) (Oral)   Ht 5\' 11"  (1.803 m)   Wt (!) 358 lb (162.4 kg)   SpO2 98%   BMI 49.93 kg/m                 Constitutional: Pt appears in NAD super morbid obese               HENT: Head: NCAT.                Right Ear: External ear normal.                 Left Ear: External ear normal. Bilat tm's with mild erythema.  Max sinus areas non tender.  Pharynx with mild erythema, no exudate               Eyes: . Pupils are equal, round, and reactive to light. Conjunctivae and EOM are normal               Nose: without d/c or deformity               Neck: Neck supple. Gross normal ROM               Cardiovascular: Normal rate and regular rhythm.                 Pulmonary/Chest: Effort normal and breath sounds without rales or wheezing.                Abd:  Soft, NT, ND, + BS, no organomegaly               Neurological: Pt is alert. At baseline orientation, motor grossly intact               Skin: Skin is warm. No rashes, no other new lesions, LE edema - none               Psychiatric: Pt behavior is normal without agitation   Micro: none  Cardiac tracings I have personally interpreted today:  ECG - NSR 88  Pertinent Radiological findings (summarize): none   Lab Results   Component Value Date   WBC 8.2 03/31/2022   HGB 14.4 03/31/2022   HCT 41.9 03/31/2022   PLT 241.0 03/31/2022   GLUCOSE 93 03/31/2022   CHOL 170 03/31/2022   TRIG 126.0 03/31/2022   HDL 41.30 03/31/2022   LDLDIRECT 113.0 02/22/2018   LDLCALC 104 (H) 03/31/2022   ALT 15 03/31/2022   AST 15 03/31/2022   NA 140 03/31/2022   K 4.1 03/31/2022   CL 103 03/31/2022   CREATININE 0.72 03/31/2022   BUN 18 03/31/2022   CO2 28 03/31/2022   TSH 1.57 03/31/2022   HGBA1C 5.6 03/31/2022   Assessment/Plan:  JAILEE GALINDEZ is a 37 y.o. White or Caucasian [1] female with  has a past medical history of ALLERGIC RHINITIS (10/30/2009), ANXIETY (09/04/2006), ASTHMA (12/25/2006), ASTHMA, WITH ACUTE EXACERBATION (05/03/2009), BLEPHARITIS, LEFT (05/21/2008), Cervical disc disease, COMMON MIGRAINE (06/14/2007), GANGLION CYST, WRIST, LEFT (10/11/2007), GERD (gastroesophageal reflux disease) (04/24/2012), Headache(784.0) (10/23/2008), HYPERLIPIDEMIA (06/14/2007), LOW BACK PAIN (09/04/2006), Morbid obesity ([place]) (09/04/2006), OTITIS MEDIA, ACUTE, LEFT (03/27/2008), SINUSITIS- ACUTE-NOS (10/11/2007), TENOSYNOVITIS, WRIST (10/11/2007), URI (10/29/2009), and URTICARIA (05/03/2009).  Encounter for well adult exam with abnormal findings Age and sex appropriate education and counseling updated with regular exercise and diet Referrals  for preventative services - none needed Immunizations addressed - none needed Smoking counseling  - none needed Evidence for depression or other mood disorder - none significant Most recent labs reviewed. I have personally reviewed and have noted: 1) the patient's medical and social history 2) The patient's current medications and supplements 3) The patient's height, weight, and BMI have been recorded in the chart   Vitamin D deficiency Last vitamin D Lab Results  Component Value Date   VD25OH 31.80 03/31/2022   Low, to start oral replacement   Dysuria Can't r/o ua - for ua and  culture with tx pending results  HLD (hyperlipidemia) Lab Results  Component Value Date   LDLCALC 104 (H) 03/31/2022   Uncontrolled, goal ldl < 100, pt for lower chol diet   Hyperglycemia Lab Results  Component Value Date   HGBA1C 5.6 03/31/2022   Stable, pt to continue current medical treatment ozempic 1 mg weekly   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  Cathlean Cower, MD 04/02/2022 8:24 PM [facility]

## 2022-03-31 NOTE — Patient Instructions (Signed)
Please take all new medication as prescribed - the OTC sudafed or mucinex for the left ear pain and allergies in addition to your current treatment  Your EKG was done today  Please continue all other medications as before, and refills have been done if requested.  Please have the pharmacy call with any other refills you may need.  Please continue your efforts at being more active, low cholesterol diet, and weight control.  You are otherwise up to date with prevention measures today.  Please keep your appointments with your specialists as you may have planned  You will be contacted regarding the referral for: Echocardiogram, Cardiac Event monitor and Cardiology (all in [place])  Please go to the LAB at the blood drawing area for the tests to be done  You will be contacted by phone if any changes need to be made immediately.  Otherwise, you will receive a letter about your results with an explanation, but please check with MyChart first.  Please remember to sign up for MyChart if you have not done so, as this will be important to you in the future with finding out test results, communicating by private email, and scheduling acute appointments online when needed.  Please make an Appointment to return in 6 months, or sooner if needed

## 2022-03-31 NOTE — Patient Instructions (Signed)
Medication Instructions:   Your physician recommends that you continue on your current medications as directed. Please refer to the Current Medication list given to you today.  *If you need a refill on your cardiac medications before your next appointment, please call your pharmacy*   Lab Work:  None Ordered  If you have labs (blood work) drawn today and your tests are completely normal, you will receive your results only by: [place] (if you have MyChart) OR A paper copy in the mail If you have any lab test that is abnormal or we need to change your treatment, we will call you to review the results.   Testing/Procedures:  Your physician has requested that you have an echocardiogram. Echocardiography is a painless test that uses sound waves to create images of your heart. It provides your doctor with information about the size and shape of your heart and how well your heart's chambers and valves are working. This procedure takes approximately one hour. There are no restrictions for this procedure. Please do NOT wear cologne, perfume, aftershave, or lotions (deodorant is allowed). Please arrive 15 minutes prior to your appointment time.  2.Your physician has recommended that you wear a Zio monitor.   This monitor is a medical device that records the heart's electrical activity. Doctors most often use these monitors to diagnose arrhythmias. Arrhythmias are problems with the speed or rhythm of the heartbeat. The monitor is a small device applied to your chest. You can wear one while you do your normal daily activities. While wearing this monitor if you have any symptoms to push the button and record what you felt. Once you have worn this monitor for the period of time provider prescribed (Usually 14 days), you will return the monitor device in the postage paid box. Once it is returned they will download the data collected and provide [place] with a report which the provider will then review  and we will call you with those results. Important tips:  Avoid showering during the first 24 hours of wearing the monitor. Avoid excessive sweating to help maximize wear time. Do not submerge the device, no hot tubs, and no swimming pools. Keep any lotions or oils away from the patch. After 24 hours you may shower with the patch on. Take brief showers with your back facing the shower head.  Do not remove patch once it has been placed because that will interrupt data and decrease adhesive wear time. Push the button when you have any symptoms and write down what you were feeling. Once you have completed wearing your monitor, remove and place into box which has postage paid and place in your outgoing mailbox.  If for some reason you have misplaced your box then call our office and we can provide another box and/or mail it off for you.     Follow-Up: At [facility], you and your health needs are our priority.  As part of our continuing mission to provide you with exceptional heart care, we have created designated Provider Care Teams.  These Care Teams include your primary Cardiologist (physician) and Advanced Practice Providers (APPs -  Physician Assistants and Nurse Practitioners) who all work together to provide you with the care you need, when you need it.  We recommend signing up for the patient portal called "MyChart".  Sign up information is provided on this After Visit Summary.  MyChart is used to connect with patients for Virtual Visits (Telemedicine).  Patients are able to view  lab/test results, encounter notes, upcoming appointments, etc.  Non-urgent messages can be sent to your provider as well.   To learn more about what you can do with MyChart, go to [email or web address].    Your next appointment:    After Testing   Provider:   You may see Kate Sable, MD or one of the following Advanced Practice Providers on your designated Care Team:   Murray Hodgkins,  NP Christell Faith, PA-C Cadence Kathlen Mody, PA-C Gerrie Nordmann, NP

## 2022-03-31 NOTE — Progress Notes (Signed)
Cardiology Office Note:    Date:  03/31/2022   ID:  Martha Thompson, DOB 06/27/1985, MRN [id number]  PCP:  Biagio Borg, MD   [place] [facility] Cardiologist:  Kate Sable, MD     Referring MD: Biagio Borg, MD   Chief Complaint  Patient presents with   New Patient (Initial Visit)    Ref by Dr. Jenny Reichmann for tachycardia-bradycardia. Patient c/o dizziness with positional changes, chest heaviness, difficulty breathing with feeling faint, HR running from 38 to 100 bpm. Medications reviewed by the patient verbally.    Martha Thompson is a 37 y.o. female who is being seen today for the evaluation of shortness of breath, bradycardia at the request of Biagio Borg, MD.   History of Present Illness:    Martha Thompson is a 37 y.o. female with a hx of hypertension, morbid obesity who presents due to bradycardia and dizziness.  Had an episode of faint feeling while driving home from work last week.  She checked her pulse with her smart watch which revealed heart rate of 38.  Felt dizzy, did not pass out.  Has had similar episodes over the past 4 months, previous episode was about 2 weeks ago.  Denies chest pain, denies shortness of breath with ordinary exertion.  Gets out of breath with overexertion.  Blood pressure usually well-controlled on current dose of losartan 25 mg daily.  Denies any history of heart disease.  Saw primary care physician earlier today, echocardiogram and cardiac monitor was ordered.  Past Medical History:  Diagnosis Date   ALLERGIC RHINITIS 10/30/2009   ANXIETY 09/04/2006   ASTHMA 12/25/2006   inhaler used 2 days ago   ASTHMA, WITH ACUTE EXACERBATION 05/03/2009   BLEPHARITIS, LEFT 05/21/2008   Cervical disc disease    2 bulging discs to neck    COMMON MIGRAINE 06/14/2007   GANGLION CYST, WRIST, LEFT 10/11/2007   GERD (gastroesophageal reflux disease) 04/24/2012   Headache(784.0) 10/23/2008   HYPERLIPIDEMIA 06/14/2007   LOW BACK PAIN  09/04/2006   Morbid obesity ([place]) 09/04/2006   OTITIS MEDIA, ACUTE, LEFT 03/27/2008   SINUSITIS- ACUTE-NOS 10/11/2007   TENOSYNOVITIS, WRIST 10/11/2007   URI 10/29/2009   URTICARIA 05/03/2009    Past Surgical History:  Procedure Laterality Date   back surgury  01/2003   s/p lumbar disc   CESAREAN SECTION N/A 02/19/2013   Procedure: CESAREAN SECTION;  Surgeon: Betsy Coder, MD;  Location: [facility];  Service: Obstetrics;  Laterality: N/A;   LAPAROSCOPIC APPENDECTOMY N/A 12/09/2018   Procedure: APPENDECTOMY LAPAROSCOPIC;  Surgeon: Benjamine Sprague, DO;  Location: [facility];  Service: General;  Laterality: N/A;   LAPAROSCOPY  04/07/2011   Procedure: LAPAROSCOPY OPERATIVE;  Surgeon: Margarette Asal, MD;  Location: [facility];  Service: Gynecology;  Laterality: N/A;  left salpingogectomy    Current Medications: Current Meds  Medication Sig   albuterol (VENTOLIN HFA) 108 (90 Base) MCG/ACT inhaler Inhale 2 puffs into the lungs every 6 (six) hours as needed.   ALPRAZolam (XANAX) 1 MG tablet TAKE 1 TABLET BY MOUTH THREE TIMES A DAY AS NEEDED FOR ANXIETY   cetirizine (ZYRTEC) 5 MG tablet Take 10 mg by mouth 2 (two) times daily.   fluticasone (FLONASE) 50 MCG/ACT nasal spray Place 1-2 sprays into both nostrils daily.   hydrOXYzine (ATARAX) 50 MG tablet Take 100 mg by mouth at bedtime.   ibuprofen (ADVIL) 800 MG tablet Take 1 tablet (800 mg total) by mouth every 8 (eight)  hours as needed for cramping or moderate pain.   levonorgestrel (MIRENA, 52 MG,) 20 MCG/DAY IUD Mirena 20 mcg/24 hours (7 yrs) 52 mg intrauterine device  Take 1 device by intrauterine route as directed.   losartan (COZAAR) 25 MG tablet Take 1 tablet (25 mg total) by mouth daily. Annual appt due in March must see provider for future refills   meloxicam (MOBIC) 15 MG tablet TAKE 1 TABLET BY MOUTH EVERY DAY AS NEEDED FOR PAIN   omalizumab (XOLAIR) 150 MG/ML prefilled syringe Inject 300 mg into the skin See admin instructions. INJECT 2 (150 MG)  SYRINGES UNDER THE SKIN FOR A TOTAL DOSE OF 300MG  EVERY 4 WEEKS.   pantoprazole (PROTONIX) 40 MG tablet TAKE 1 TABLET BY MOUTH EVERY DAY   promethazine (PHENERGAN) 25 MG tablet Take 1 tablet (25 mg total) by mouth every 8 (eight) hours as needed for nausea or vomiting.     Allergies:   Prozac [fluoxetine hcl]   Social History   Socioeconomic History   Marital status: Married    Spouse name: Not on file   Number of children: Not on file   Years of education: Not on file   Highest education level: Not on file  Occupational History    Employer: [employer]    Comment: 12 hour days  Tobacco Use   Smoking status: Former    Packs/day: 0.50    Years: 16.00    Additional pack years: 0.00    Total pack years: 8.00    Types: Cigarettes   Smokeless tobacco: Never  Vaping Use   Vaping Use: Every day   Substances: Nicotine, Flavoring  Substance and Sexual Activity   Alcohol use: Yes    Alcohol/week: 0.0 standard drinks of alcohol    Comment: rarely   Drug use: No   Sexual activity: Not on file  Other Topics Concern   Not on file  Social History Narrative   Married.   2 children.   Works for [employer].   Enjoys spending time outdoors.   Social Determinants of Health   Financial Resource Strain: Not on file  Food Insecurity: Not on file  Transportation Needs: Not on file  Physical Activity: Not on file  Stress: Not on file  Social Connections: Not on file     Family History: The patient's family history includes Anxiety disorder in her father; Diabetes in her father.  ROS:   Please see the history of present illness.    All other systems reviewed and are negative.  EKGs/Labs/Other Studies Reviewed:    The following studies were reviewed today:   EKG:  EKG is  ordered today.  The ekg ordered today demonstrates normal sinus rhythm, normal ECG, heart rate 98  Recent Labs: 03/31/2022: ALT 15; BUN 18; Creatinine, Ser 0.72; Hemoglobin 14.4; Platelets 241.0; Potassium 4.1;  Sodium 140; TSH 1.57  Recent Lipid Panel    Component Value Date/Time   CHOL 170 03/31/2022 0919   TRIG 126.0 03/31/2022 0919   HDL 41.30 03/31/2022 0919   CHOLHDL 4 03/31/2022 0919   VLDL 25.2 03/31/2022 0919   LDLCALC 104 (H) 03/31/2022 0919   LDLDIRECT 113.0 02/22/2018 1508     Risk Assessment/Calculations:     HYPERTENSION CONTROL Vitals:   03/31/22 1452 03/31/22 1506  BP: (!) 148/93 (!) 147/96    The patient's blood pressure is elevated above target today.  In order to address the patient's elevated BP: Blood pressure will be monitored at home to determine if medication  changes need to be made.        Physical Exam:    VS:  BP (!) 147/96 (BP Location: Right Wrist, Patient Position: Sitting, Cuff Size: Large)   Pulse 98   Ht 5\' 11"  (1.803 m)   Wt (!) 359 lb 8 oz (163.1 kg)   SpO2 97%   BMI 50.14 kg/m     Wt Readings from Last 3 Encounters:  03/31/22 (!) 359 lb 8 oz (163.1 kg)  03/31/22 (!) 358 lb (162.4 kg)  05/03/21 300 lb (136.1 kg)     GEN:  Well nourished, well developed in no acute distress HEENT: Normal NECK: No JVD; No carotid bruits CARDIAC: RRR, no murmurs, rubs, gallops RESPIRATORY:  Clear to auscultation without rales, wheezing or rhonchi  ABDOMEN: Soft, non-tender, non-distended MUSCULOSKELETAL:  No edema; No deformity  SKIN: Warm and dry NEUROLOGIC:  Alert and oriented x 3 PSYCHIATRIC:  Normal affect   ASSESSMENT:    1. Bradycardia   2. Primary hypertension   3. Morbid obesity ([place])    PLAN:    In order of problems listed above:  Bradycardia, presyncope, home heart rates 38.  Cardiac monitor placed by primary care physician.  Echocardiogram was also ordered.  EKG today showing sinus rhythm heart rate 98.  Orthostatic vitals with no evidence of orthostasis.  Will review results after performed. Hypertension, BP elevated today, usually controlled.  Continue losartan 25 mg daily. Morbid obesity, low-calorie diet, weight loss  advised.     Follow-up in 6 to 8 weeks  Medication Adjustments/Labs and Tests Ordered: Current medicines are reviewed at length with the patient today.  Concerns regarding medicines are outlined above.  Orders Placed This Encounter  Procedures   LONG TERM MONITOR (3-14 DAYS)   EKG 12-Lead   No orders of the defined types were placed in this encounter.   Patient Instructions  Medication Instructions:   Your physician recommends that you continue on your current medications as directed. Please refer to the Current Medication list given to you today.  *If you need a refill on your cardiac medications before your next appointment, please call your pharmacy*   Lab Work:  None Ordered  If you have labs (blood work) drawn today and your tests are completely normal, you will receive your results only by: [place] (if you have MyChart) OR A paper copy in the mail If you have any lab test that is abnormal or we need to change your treatment, we will call you to review the results.   Testing/Procedures:  Your physician has requested that you have an echocardiogram. Echocardiography is a painless test that uses sound waves to create images of your heart. It provides your doctor with information about the size and shape of your heart and how well your heart's chambers and valves are working. This procedure takes approximately one hour. There are no restrictions for this procedure. Please do NOT wear cologne, perfume, aftershave, or lotions (deodorant is allowed). Please arrive 15 minutes prior to your appointment time.  2.Your physician has recommended that you wear a Zio monitor.   This monitor is a medical device that records the heart's electrical activity. Doctors most often use these monitors to diagnose arrhythmias. Arrhythmias are problems with the speed or rhythm of the heartbeat. The monitor is a small device applied to your chest. You can wear one while you do your normal  daily activities. While wearing this monitor if you have any symptoms to push the button  and record what you felt. Once you have worn this monitor for the period of time provider prescribed (Usually 14 days), you will return the monitor device in the postage paid box. Once it is returned they will download the data collected and provide [place] with a report which the provider will then review and we will call you with those results. Important tips:  Avoid showering during the first 24 hours of wearing the monitor. Avoid excessive sweating to help maximize wear time. Do not submerge the device, no hot tubs, and no swimming pools. Keep any lotions or oils away from the patch. After 24 hours you may shower with the patch on. Take brief showers with your back facing the shower head.  Do not remove patch once it has been placed because that will interrupt data and decrease adhesive wear time. Push the button when you have any symptoms and write down what you were feeling. Once you have completed wearing your monitor, remove and place into box which has postage paid and place in your outgoing mailbox.  If for some reason you have misplaced your box then call our office and we can provide another box and/or mail it off for you.     Follow-Up: At [facility], you and your health needs are our priority.  As part of our continuing mission to provide you with exceptional heart care, we have created designated Provider Care Teams.  These Care Teams include your primary Cardiologist (physician) and Advanced Practice Providers (APPs -  Physician Assistants and Nurse Practitioners) who all work together to provide you with the care you need, when you need it.  We recommend signing up for the patient portal called "MyChart".  Sign up information is provided on this After Visit Summary.  MyChart is used to connect with patients for Virtual Visits (Telemedicine).  Patients are able to view lab/test results,  encounter notes, upcoming appointments, etc.  Non-urgent messages can be sent to your provider as well.   To learn more about what you can do with MyChart, go to [email or web address].    Your next appointment:    After Testing   Provider:   You may see Kate Sable, MD or one of the following Advanced Practice Providers on your designated Care Team:   Murray Hodgkins, NP Christell Faith, PA-C Cadence Kathlen Mody, PA-C Gerrie Nordmann, NP    Signed, Kate Sable, MD  03/31/2022 3:55 PM    [place]

## 2022-04-02 ENCOUNTER — Encounter: Payer: Self-pay | Admitting: Internal Medicine

## 2022-04-02 DIAGNOSIS — I495 Sick sinus syndrome: Secondary | ICD-10-CM | POA: Insufficient documentation

## 2022-04-02 DIAGNOSIS — H6992 Unspecified Eustachian tube disorder, left ear: Secondary | ICD-10-CM | POA: Insufficient documentation

## 2022-04-02 NOTE — Assessment & Plan Note (Signed)
Lab Results  Component Value Date   LDLCALC 104 (H) 03/31/2022   Uncontrolled, goal ldl < 100, pt for lower chol diet

## 2022-04-02 NOTE — Assessment & Plan Note (Signed)
Lab Results  Component Value Date   HGBA1C 5.6 03/31/2022   Stable, pt to continue current medical treatment ozempic 1 mg weekly

## 2022-04-02 NOTE — Assessment & Plan Note (Signed)
Also for mucinex bid prn 

## 2022-04-02 NOTE — Assessment & Plan Note (Signed)
Can't r/o ua - for ua and culture with tx pending results

## 2022-04-02 NOTE — Assessment & Plan Note (Signed)

## 2022-04-02 NOTE — Assessment & Plan Note (Signed)
Last vitamin D Lab Results  Component Value Date   VD25OH 31.80 03/31/2022   Low, to start oral replacement

## 2022-04-02 NOTE — Assessment & Plan Note (Signed)
With hx highly suggestive of possible arrythmia - for echo, card event monitor, refer Cardiology

## 2022-04-03 ENCOUNTER — Other Ambulatory Visit: Payer: Self-pay | Admitting: Internal Medicine

## 2022-04-03 LAB — URINE CULTURE

## 2022-04-04 ENCOUNTER — Other Ambulatory Visit: Payer: Self-pay | Admitting: Internal Medicine

## 2022-04-05 ENCOUNTER — Ambulatory Visit: Payer: Managed Care, Other (non HMO) | Attending: Internal Medicine

## 2022-04-05 DIAGNOSIS — R001 Bradycardia, unspecified: Secondary | ICD-10-CM | POA: Diagnosis not present

## 2022-04-05 DIAGNOSIS — I495 Sick sinus syndrome: Secondary | ICD-10-CM

## 2022-04-05 LAB — ECHOCARDIOGRAM COMPLETE
AR max vel: 3.04 cm2
AV Area VTI: 3.2 cm2
AV Area mean vel: 3.16 cm2
AV Mean grad: 5 mmHg
AV Peak grad: 9.7 mmHg
Ao pk vel: 1.56 m/s
S' Lateral: 3.5 cm

## 2022-04-07 ENCOUNTER — Encounter: Payer: Self-pay | Admitting: Cardiology

## 2022-04-13 ENCOUNTER — Other Ambulatory Visit: Payer: Self-pay | Admitting: Internal Medicine

## 2022-04-19 NOTE — Addendum Note (Signed)
Addended by: Coolidge Breeze on: 04/19/2022 09:11 AM   Modules accepted: Orders

## 2022-04-26 ENCOUNTER — Other Ambulatory Visit: Payer: Self-pay | Admitting: Internal Medicine

## 2022-05-03 ENCOUNTER — Other Ambulatory Visit: Payer: Self-pay | Admitting: Internal Medicine

## 2022-05-04 MED ORDER — MELOXICAM 15 MG PO TABS: 15.0000 mg | ORAL_TABLET | Freq: Every day | ORAL | 0 refills | Status: DC | PRN

## 2022-05-09 DIAGNOSIS — J301 Allergic rhinitis due to pollen: Secondary | ICD-10-CM | POA: Insufficient documentation

## 2022-05-10 ENCOUNTER — Encounter: Payer: Self-pay | Admitting: Cardiology

## 2022-05-10 ENCOUNTER — Ambulatory Visit: Payer: Managed Care, Other (non HMO) | Attending: Cardiology | Admitting: Cardiology

## 2022-05-10 VITALS — BP 140/94 | HR 96 | Ht 71.0 in | Wt 359.0 lb

## 2022-05-10 DIAGNOSIS — I10 Essential (primary) hypertension: Secondary | ICD-10-CM | POA: Diagnosis not present

## 2022-05-10 DIAGNOSIS — R001 Bradycardia, unspecified: Secondary | ICD-10-CM | POA: Diagnosis not present

## 2022-05-10 MED ORDER — LOSARTAN POTASSIUM 50 MG PO TABS: 50.0000 mg | ORAL_TABLET | Freq: Every day | ORAL | 3 refills | Status: DC

## 2022-05-10 NOTE — Progress Notes (Signed)
Cardiology Office Note:    Date:  05/10/2022   ID:  Martha Thompson, DOB Feb 17, 1985, MRN [id number]  PCP:  Corwin Levins, MD   [facility] Providers Cardiologist:  Debbe Odea, MD     Referring MD: Corwin Levins, MD   Chief Complaint  Patient presents with   Follow up Zio monitor     Patient c/o sinus pressure/headache/cough and congestion; she will pick up an antibiotic when she leaves our office today, she is also taking  Tylenol severe sinus BID. Patient has elevated blood pressure. Medications reviewed by the patient verbally.      History of Present Illness:    Martha Thompson is a 37 y.o. female with a hx of hypertension, morbid obesity who presents for follow-up.  Previously seen due to bradycardia.  She wore a cardiac monitor for 2 weeks.  Echocardiogram was also obtained.  States her blood pressures have been running higher currently.  She is healthy trying to lose weight without success.  Has no new concerns at this time.  Denies syncope.   Past Medical History:  Diagnosis Date   ALLERGIC RHINITIS 10/30/2009   ANXIETY 09/04/2006   ASTHMA 12/25/2006   inhaler used 2 days ago   ASTHMA, WITH ACUTE EXACERBATION 05/03/2009   BLEPHARITIS, LEFT 05/21/2008   Cervical disc disease    2 bulging discs to neck    COMMON MIGRAINE 06/14/2007   GANGLION CYST, WRIST, LEFT 10/11/2007   GERD (gastroesophageal reflux disease) 04/24/2012   Headache(784.0) 10/23/2008   HYPERLIPIDEMIA 06/14/2007   LOW BACK PAIN 09/04/2006   Morbid obesity (HCC) 09/04/2006   OTITIS MEDIA, ACUTE, LEFT 03/27/2008   SINUSITIS- ACUTE-NOS 10/11/2007   TENOSYNOVITIS, WRIST 10/11/2007   URI 10/29/2009   URTICARIA 05/03/2009    Past Surgical History:  Procedure Laterality Date   back surgury  01/2003   s/p lumbar disc   CESAREAN SECTION N/A 02/19/2013   Procedure: CESAREAN SECTION;  Surgeon: Michael Litter, MD;  Location: [facility];  Service: Obstetrics;  Laterality: N/A;   LAPAROSCOPIC  APPENDECTOMY N/A 12/09/2018   Procedure: APPENDECTOMY LAPAROSCOPIC;  Surgeon: Sung Amabile, DO;  Location: [facility];  Service: General;  Laterality: N/A;   LAPAROSCOPY  04/07/2011   Procedure: LAPAROSCOPY OPERATIVE;  Surgeon: Meriel Pica, MD;  Location: [facility];  Service: Gynecology;  Laterality: N/A;  left salpingogectomy    Current Medications: Current Meds  Medication Sig   albuterol (VENTOLIN HFA) 108 (90 Base) MCG/ACT inhaler Inhale 2 puffs into the lungs every 6 (six) hours as needed.   ALPRAZolam (XANAX) 1 MG tablet TAKE 1 TABLET BY MOUTH THREE TIMES A DAY AS NEEDED FOR ANXIETY   cetirizine (ZYRTEC) 5 MG tablet Take 10 mg by mouth 2 (two) times daily.   fluticasone (FLONASE) 50 MCG/ACT nasal spray Place 1-2 sprays into both nostrils daily.   levonorgestrel (MIRENA, 52 MG,) 20 MCG/DAY IUD Mirena 20 mcg/24 hours (7 yrs) 52 mg intrauterine device  Take 1 device by intrauterine route as directed.   meloxicam (MOBIC) 15 MG tablet Take 1 tablet (15 mg total) by mouth daily as needed for pain.   omalizumab Geoffry Paradise) 150 MG/ML prefilled syringe Inject 300 mg into the skin See admin instructions. INJECT 2 (150 MG) SYRINGES UNDER THE SKIN FOR A TOTAL DOSE OF 300MG  EVERY 4 WEEKS.   pantoprazole (PROTONIX) 40 MG tablet TAKE 1 TABLET BY MOUTH EVERY DAY   Phenylephrine-Acetaminophen (TYLENOL SINUS+HEADACHE) 5-325 MG TABS Take by mouth in the  morning and at bedtime.   promethazine (PHENERGAN) 25 MG tablet Take 1 tablet (25 mg total) by mouth every 8 (eight) hours as needed for nausea or vomiting.   [DISCONTINUED] losartan (COZAAR) 25 MG tablet Take 1 tablet (25 mg total) by mouth daily.     Allergies:   Prozac [fluoxetine hcl]   Social History   Socioeconomic History   Marital status: Married    Spouse name: Not on file   Number of children: Not on file   Years of education: Not on file   Highest education level: Not on file  Occupational History    Employer: [employer]    Comment: 12  hour days  Tobacco Use   Smoking status: Former    Packs/day: 0.50    Years: 16.00    Additional pack years: 0.00    Total pack years: 8.00    Types: Cigarettes   Smokeless tobacco: Never  Vaping Use   Vaping Use: Every day   Substances: Nicotine, Flavoring  Substance and Sexual Activity   Alcohol use: Yes    Alcohol/week: 0.0 standard drinks of alcohol    Comment: rarely   Drug use: No   Sexual activity: Not on file  Other Topics Concern   Not on file  Social History Narrative   Married.   2 children.   Works for [employer].   Enjoys spending time outdoors.   Social Determinants of Health   Financial Resource Strain: Not on file  Food Insecurity: Not on file  Transportation Needs: Not on file  Physical Activity: Not on file  Stress: Not on file  Social Connections: Not on file     Family History: The patient's family history includes Anxiety disorder in her father; Diabetes in her father.  ROS:   Please see the history of present illness.    All other systems reviewed and are negative.  EKGs/Labs/Other Studies Reviewed:    The following studies were reviewed today:   EKG:  EKG not ordered today.    Recent Labs: 03/31/2022: ALT 15; BUN 18; Creatinine, Ser 0.72; Hemoglobin 14.4; Platelets 241.0; Potassium 4.1; Sodium 140; TSH 1.57  Recent Lipid Panel    Component Value Date/Time   CHOL 170 03/31/2022 0919   TRIG 126.0 03/31/2022 0919   HDL 41.30 03/31/2022 0919   CHOLHDL 4 03/31/2022 0919   VLDL 25.2 03/31/2022 0919   LDLCALC 104 (H) 03/31/2022 0919   LDLDIRECT 113.0 02/22/2018 1508     Risk Assessment/Calculations:     HYPERTENSION CONTROL Vitals:   05/10/22 1428 05/10/22 1439  BP: (!) 140/98 (!) 140/94    The patient's blood pressure is elevated above target today.  In order to address the patient's elevated BP: A current anti-hypertensive medication was adjusted today.        Physical Exam:    VS:  BP (!) 140/94 (BP Location: Right  Wrist, Patient Position: Sitting, Cuff Size: Normal)   Pulse 96   Ht 5\' 11"  (1.803 m)   Wt (!) 359 lb (162.8 kg)   SpO2 98%   BMI 50.07 kg/m     Wt Readings from Last 3 Encounters:  05/10/22 (!) 359 lb (162.8 kg)  03/31/22 (!) 359 lb 8 oz (163.1 kg)  03/31/22 (!) 358 lb (162.4 kg)     GEN:  Well nourished, well developed in no acute distress HEENT: Normal NECK: No JVD; No carotid bruits CARDIAC: RRR, no murmurs, rubs, gallops RESPIRATORY:  Clear to auscultation without rales, wheezing or  rhonchi  ABDOMEN: Soft, non-tender, non-distended MUSCULOSKELETAL:  No edema; No deformity  SKIN: Warm and dry NEUROLOGIC:  Alert and oriented x 3 PSYCHIATRIC:  Normal affect   ASSESSMENT:    1. Bradycardia   2. Primary hypertension   3. Morbid obesity (HCC)     PLAN:    In order of problems listed above:  Bradycardia, presyncope, cardiac monitor 4/24 showed no high degree AV block, average heart rate 97.  Echocardiogram 03/2022 normal EF 55 to 60%.  Patient made aware of results, reassured. Hypertension, BP elevated.  Increase losartan to 50 mg daily. Morbid obesity, low-calorie diet, weight loss advised.  Refer to dietary/nutrition services.     Follow-up in 3 months.  Medication Adjustments/Labs and Tests Ordered: Current medicines are reviewed at length with the patient today.  Concerns regarding medicines are outlined above.  Orders Placed This Encounter  Procedures   Amb ref to Medical Nutrition Therapy-MNT   Meds ordered this encounter  Medications   losartan (COZAAR) 50 MG tablet    Sig: Take 1 tablet (50 mg total) by mouth daily.    Dispense:  90 tablet    Refill:  3    Patient Instructions  Medication Instructions:   INCREASE Losartan - Take one tablet ( 50 mg) by mouth daily.   *If you need a refill on your cardiac medications before your next appointment, please call your pharmacy*   Lab Work:  None Ordered  If you have labs (blood work) drawn today and  your tests are completely normal, you will receive your results only by: MyChart Message (if you have MyChart) OR A paper copy in the mail If you have any lab test that is abnormal or we need to change your treatment, we will call you to review the results.   Testing/Procedures:  None Ordered   Follow-Up: At [facility], you and your health needs are our priority.  As part of our continuing mission to provide you with exceptional heart care, we have created designated Provider Care Teams.  These Care Teams include your primary Cardiologist (physician) and Advanced Practice Providers (APPs -  Physician Assistants and Nurse Practitioners) who all work together to provide you with the care you need, when you need it.  We recommend signing up for the patient portal called "MyChart".  Sign up information is provided on this After Visit Summary.  MyChart is used to connect with patients for Virtual Visits (Telemedicine).  Patients are able to view lab/test results, encounter notes, upcoming appointments, etc.  Non-urgent messages can be sent to your provider as well.   To learn more about what you can do with MyChart, go to [email or web address].    Your next appointment:   3 month(s)  Provider:   You may see Debbe Odea, MD or one of the following Advanced Practice Providers on your designated Care Team:   Nicolasa Ducking, NP Eula Listen, PA-C Cadence Fransico Michael, PA-C Charlsie Quest, NP    Signed, Debbe Odea, MD  05/10/2022 3:06 PM    [facility]

## 2022-05-10 NOTE — Patient Instructions (Signed)
Medication Instructions:   INCREASE Losartan - Take one tablet ( 50 mg) by mouth daily.   *If you need a refill on your cardiac medications before your next appointment, please call your pharmacy*   Lab Work:  None Ordered  If you have labs (blood work) drawn today and your tests are completely normal, you will receive your results only by: MyChart Message (if you have MyChart) OR A paper copy in the mail If you have any lab test that is abnormal or we need to change your treatment, we will call you to review the results.   Testing/Procedures:  None Ordered   Follow-Up: At [facility], you and your health needs are our priority.  As part of our continuing mission to provide you with exceptional heart care, we have created designated Provider Care Teams.  These Care Teams include your primary Cardiologist (physician) and Advanced Practice Providers (APPs -  Physician Assistants and Nurse Practitioners) who all work together to provide you with the care you need, when you need it.  We recommend signing up for the patient portal called "MyChart".  Sign up information is provided on this After Visit Summary.  MyChart is used to connect with patients for Virtual Visits (Telemedicine).  Patients are able to view lab/test results, encounter notes, upcoming appointments, etc.  Non-urgent messages can be sent to your provider as well.   To learn more about what you can do with MyChart, go to [email or web address].    Your next appointment:   3 month(s)  Provider:   You may see Debbe Odea, MD or one of the following Advanced Practice Providers on your designated Care Team:   Nicolasa Ducking, NP Eula Listen, PA-C Cadence Fransico Michael, PA-C Charlsie Quest, NP

## 2022-06-05 ENCOUNTER — Other Ambulatory Visit: Payer: Self-pay | Admitting: Internal Medicine

## 2022-06-23 IMAGING — US US PELVIS COMPLETE TRANSABD/TRANSVAG W DUPLEX AND/OR DOPPLER
1 series · 13 of 25 positions shown · non-contrast
Comparison: None.
COMPARISON: None.

Addendum:
CLINICAL DATA: Pelvic pain

EXAM:
TRANSABDOMINAL AND TRANSVAGINAL ULTRASOUND OF PELVIS
DOPPLER ULTRASOUND OF OVARIES
TECHNIQUE: Both transabdominal and transvaginal ultrasound examinations of the
pelvis were performed. Transabdominal technique was performed for
global imaging of the pelvis including uterus, ovaries, adnexal
regions, and pelvic cul-de-sac.
It was necessary to proceed with endovaginal exam following the
transabdominal exam to visualize the endometrium and ovaries. Color
and duplex Doppler ultrasound was utilized to evaluate blood flow to
the ovaries.

[Series 1: us pelvic complete w transvaginal and torsion righ · 13 of 106 slices shown]
[im 1/106]
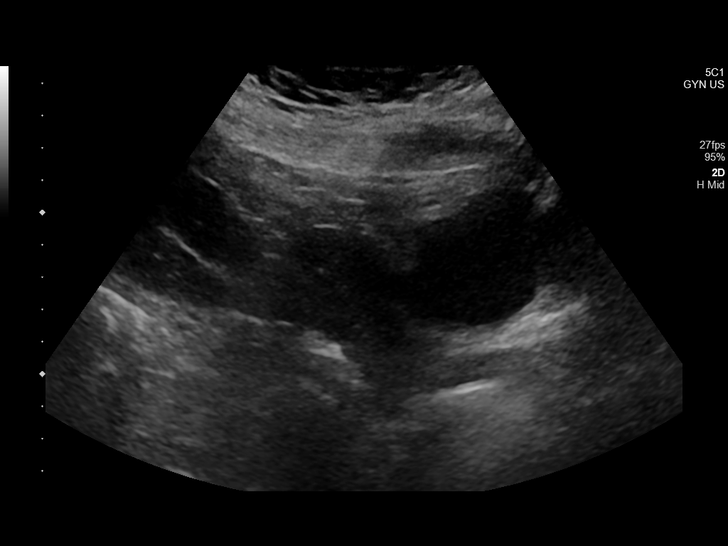
[im 9/106]
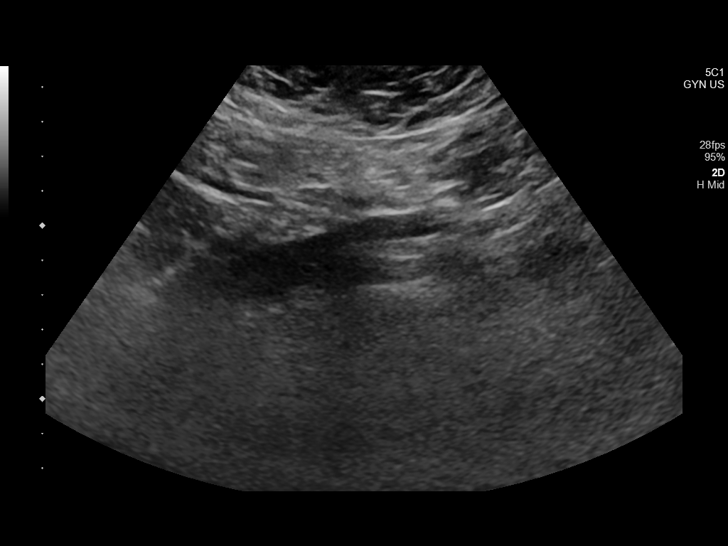
[im 18/106]
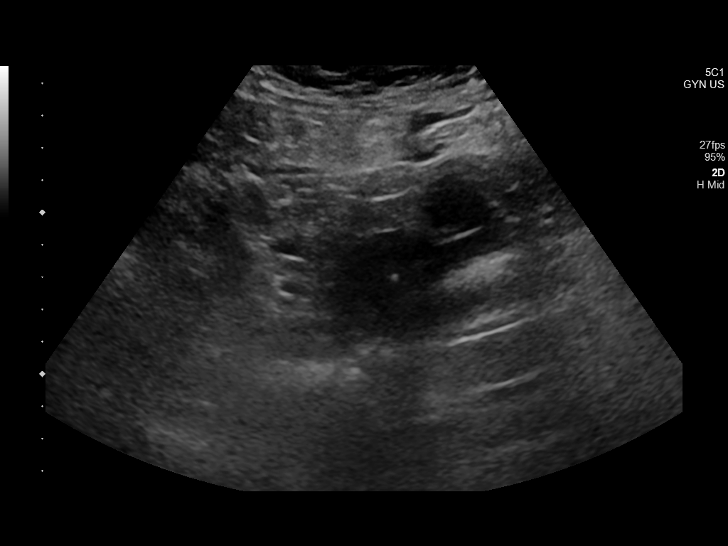
[im 27/106]
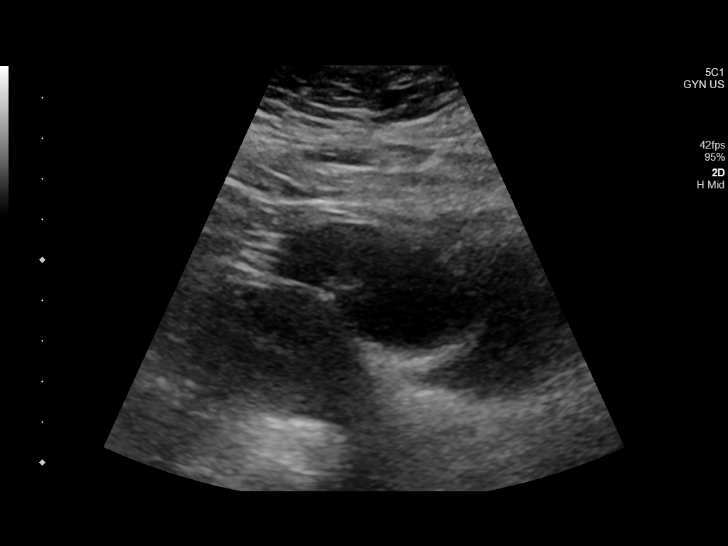
[im 36/106]
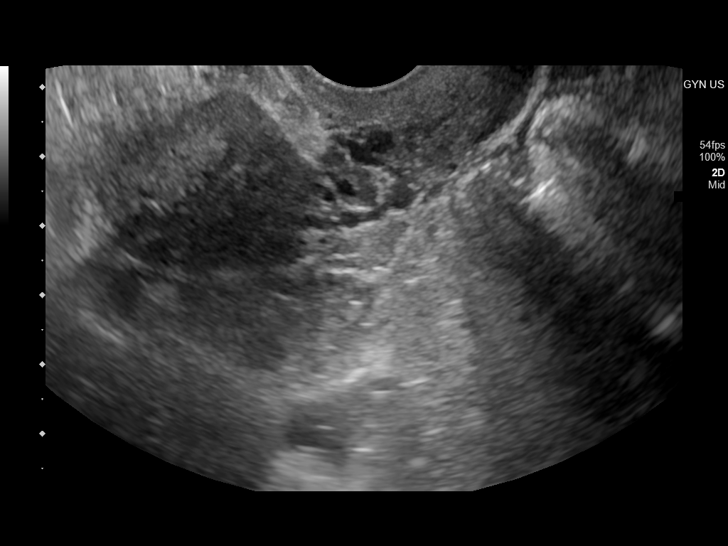
[im 44/106]
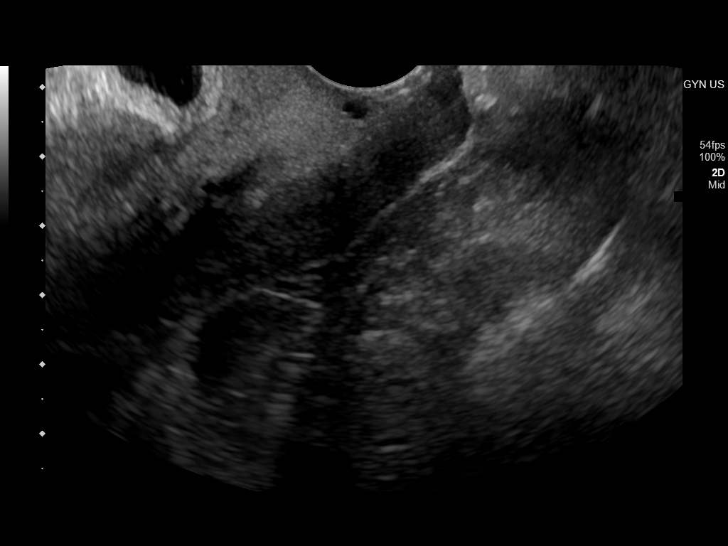
[im 53/106]
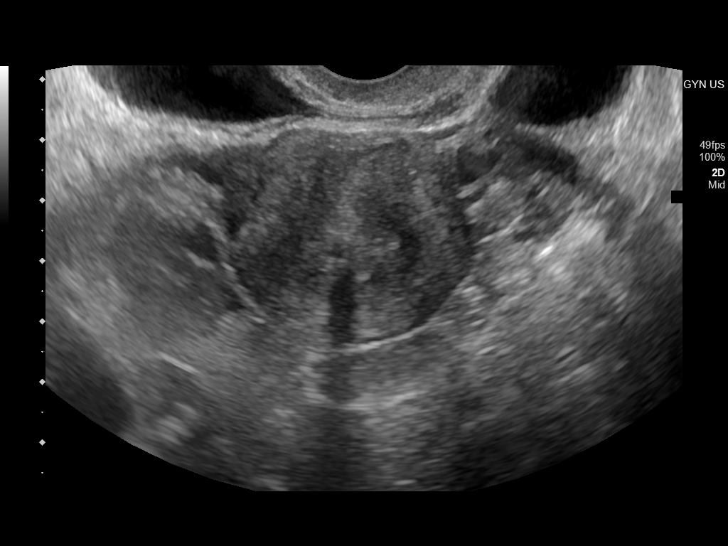
[im 62/106]
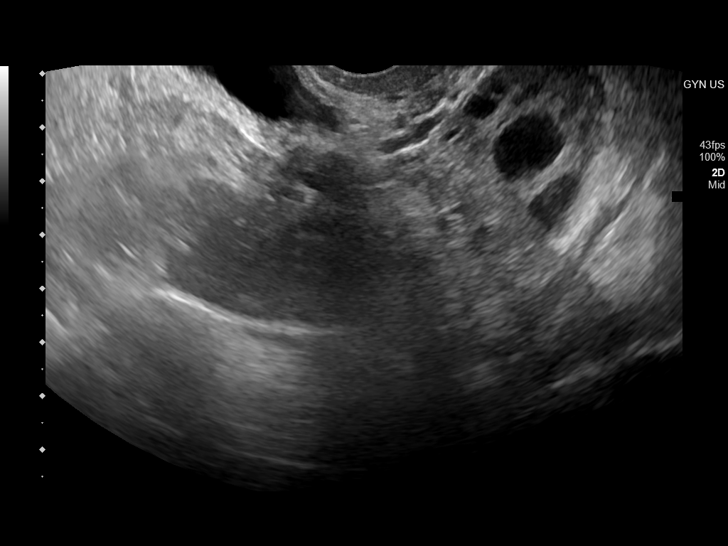
[im 71/106]
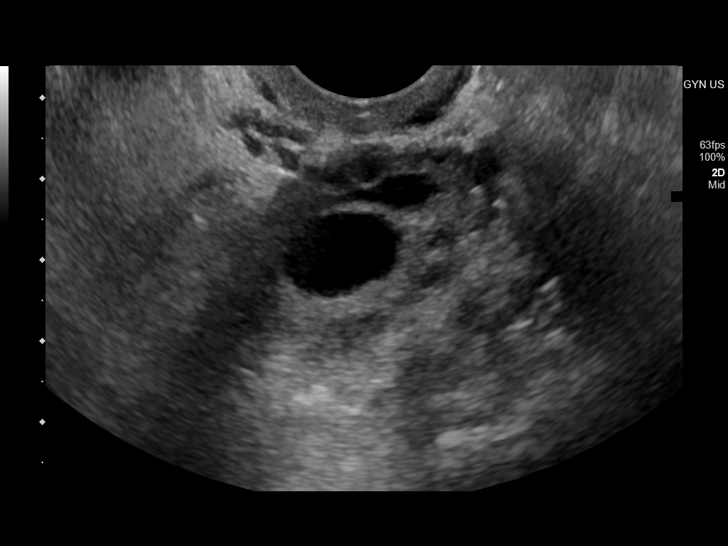
[im 79/106]
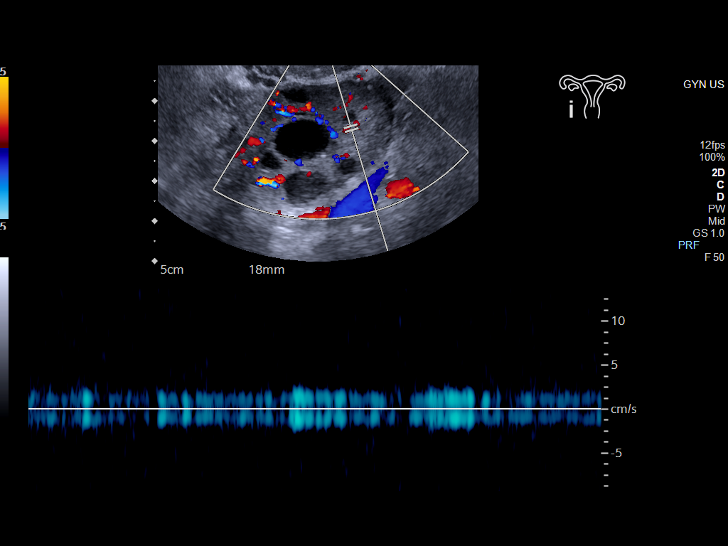
[im 88/106]
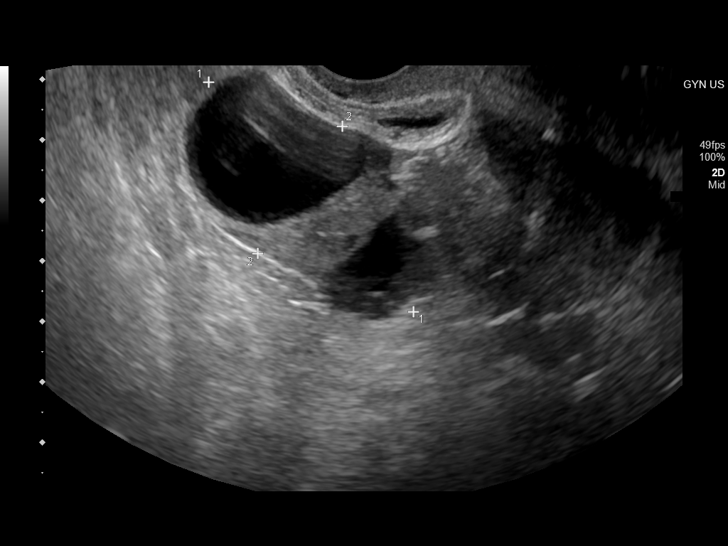
[im 97/106]
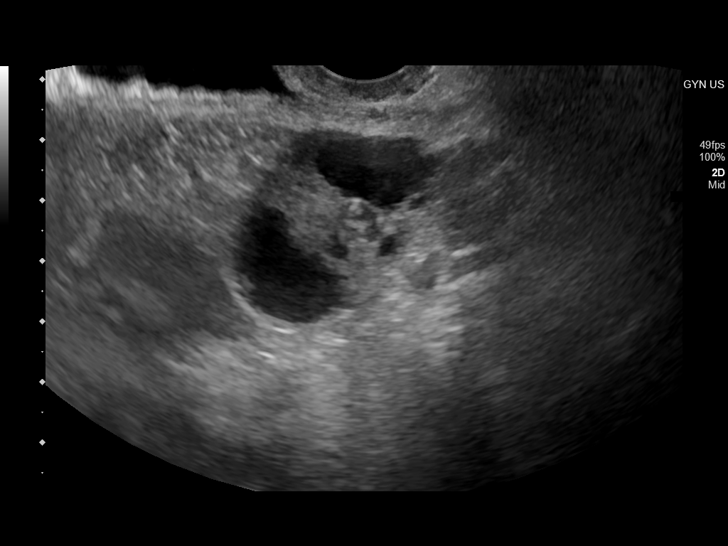
[im 106/106]
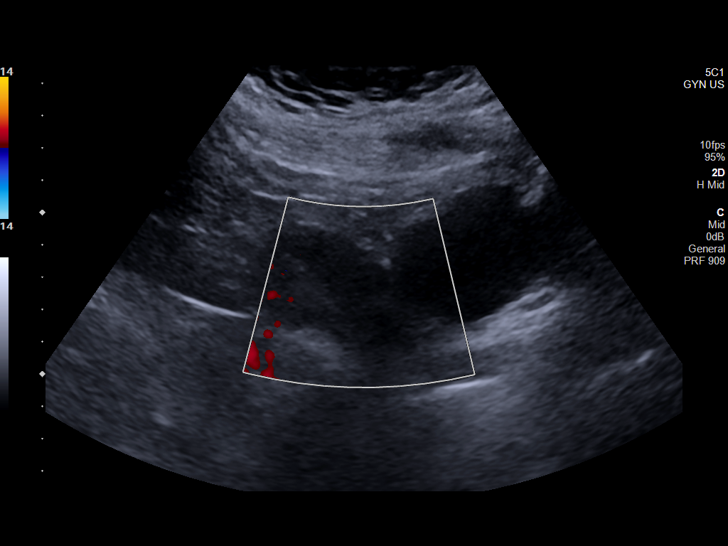

[13 of 25 positions shown; findings below may reference images not displayed]

FINDINGS: Uterus

Measurements: 7.8 x 3.5 x 4.7 cm = volume: 66 mL. No fibroids or
other mass visualized.

Endometrium

Thickness: 5.6 mm. Linear hyperechoic structure in the fundus of the
uterus suggests IUD.

Right ovary

Measurements: 3.9 x 2.8 x 2.2 cm = volume: 12 mL. There are few
follicles largest measuring 11 mm.

Left ovary

Measurements: 5.1 x 2.5 x 3.6 cm = volume: 24 mL. There are few
cysts in the left ovary largest measuring 3.1 cm in diameter.

Pulsed Doppler evaluation of both ovaries demonstrates normal
low-resistance arterial and venous waveforms.

Other findings

No abnormal free fluid.
IMPRESSION: There is 3.1 cm cyst in the left ovary suggesting a dominant
follicle or functional cyst. IUD is seen in the central portion of
fundus of the uterus.

Pelvic sonogram is otherwise unremarkable.

ADDENDUM:
This addendum is made to clarify the Doppler technique used for the
study. Color flow Doppler and pulsed Doppler examinations were
performed.

*** End of Addendum ***
FINDINGS: Uterus

Measurements: 7.8 x 3.5 x 4.7 cm = volume: 66 mL. No fibroids or
other mass visualized.

Endometrium

Thickness: 5.6 mm. Linear hyperechoic structure in the fundus of the
uterus suggests IUD.

Right ovary

Measurements: 3.9 x 2.8 x 2.2 cm = volume: 12 mL. There are few
follicles largest measuring 11 mm.

Left ovary

Measurements: 5.1 x 2.5 x 3.6 cm = volume: 24 mL. There are few
cysts in the left ovary largest measuring 3.1 cm in diameter.

Pulsed Doppler evaluation of both ovaries demonstrates normal
low-resistance arterial and venous waveforms.

Other findings

No abnormal free fluid.
IMPRESSION: There is 3.1 cm cyst in the left ovary suggesting a dominant
follicle or functional cyst. IUD is seen in the central portion of
fundus of the uterus.

Pelvic sonogram is otherwise unremarkable.

## 2022-07-10 ENCOUNTER — Other Ambulatory Visit: Payer: Self-pay | Admitting: Internal Medicine

## 2022-07-22 ENCOUNTER — Other Ambulatory Visit: Payer: Self-pay | Admitting: Internal Medicine

## 2022-08-10 ENCOUNTER — Ambulatory Visit: Payer: Managed Care, Other (non HMO) | Admitting: Cardiology

## 2022-08-29 ENCOUNTER — Other Ambulatory Visit: Payer: Self-pay | Admitting: Obstetrics and Gynecology

## 2022-08-29 DIAGNOSIS — N6320 Unspecified lump in the left breast, unspecified quadrant: Secondary | ICD-10-CM

## 2022-09-20 ENCOUNTER — Ambulatory Visit
Admission: RE | Admit: 2022-09-20 | Discharge: 2022-09-20 | Disposition: A | Discharge status: Home / Self Care | Payer: Managed Care, Other (non HMO) | Source: Ambulatory Visit | Attending: Obstetrics and Gynecology | Admitting: Obstetrics and Gynecology

## 2022-09-20 DIAGNOSIS — N6320 Unspecified lump in the left breast, unspecified quadrant: Secondary | ICD-10-CM

## 2022-10-15 ENCOUNTER — Other Ambulatory Visit: Payer: Self-pay | Admitting: Internal Medicine

## 2022-12-05 ENCOUNTER — Other Ambulatory Visit: Payer: Self-pay

## 2022-12-05 ENCOUNTER — Other Ambulatory Visit: Payer: Self-pay | Admitting: Internal Medicine

## 2023-01-15 ENCOUNTER — Other Ambulatory Visit: Payer: Self-pay | Admitting: Internal Medicine

## 2023-02-25 ENCOUNTER — Ambulatory Visit
Admission: EM | Admit: 2023-02-25 | Discharge: 2023-02-25 | Disposition: A | Discharge status: Home / Self Care | Payer: Managed Care, Other (non HMO) | Attending: Emergency Medicine | Admitting: Emergency Medicine

## 2023-02-25 ENCOUNTER — Encounter: Payer: Self-pay | Admitting: *Deleted

## 2023-02-25 DIAGNOSIS — R350 Frequency of micturition: Secondary | ICD-10-CM

## 2023-02-25 DIAGNOSIS — M5441 Lumbago with sciatica, right side: Secondary | ICD-10-CM | POA: Diagnosis not present

## 2023-02-25 DIAGNOSIS — R300 Dysuria: Secondary | ICD-10-CM

## 2023-02-25 DIAGNOSIS — M5442 Lumbago with sciatica, left side: Secondary | ICD-10-CM

## 2023-02-25 DIAGNOSIS — R809 Proteinuria, unspecified: Secondary | ICD-10-CM

## 2023-02-25 LAB — POCT URINALYSIS DIP (MANUAL ENTRY)
Bilirubin, UA: NEGATIVE
Blood, UA: NEGATIVE
Glucose, UA: NEGATIVE mg/dL
Ketones, POC UA: NEGATIVE mg/dL
Leukocytes, UA: NEGATIVE
Nitrite, UA: NEGATIVE
Protein Ur, POC: 30 mg/dL — AB
Spec Grav, UA: 1.015
Urobilinogen, UA: 1 U/dL
pH, UA: 8.5 — AB

## 2023-02-25 LAB — POCT URINE PREGNANCY: Preg Test, Ur: NEGATIVE

## 2023-02-25 MED ORDER — CYCLOBENZAPRINE HCL 5 MG PO TABS: 5.0000 mg | ORAL_TABLET | Freq: Three times a day (TID) | ORAL | 0 refills | Status: AC | PRN

## 2023-02-25 NOTE — ED Provider Notes (Signed)
Renaldo Fiddler    CSN: [id number] Arrival date & time: 02/25/23  1227      History   Chief Complaint Chief Complaint  Patient presents with   Leg Pain   Urinary Frequency   Dysuria    HPI Martha Thompson is a 38 y.o. female.   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  Denies any blunt force trauma.  Patient denies saddle numbness loss of bowel or bladder or loss of function.  Patient is currently on prednisone for allergies.  Patient also complaining of frequency and dysuria for 1 week was treated for UTI.    The history is provided by the patient. No language interpreter was used.    Past Medical History:  Diagnosis Date   ALLERGIC RHINITIS 10/30/2009   ANXIETY 09/04/2006   ASTHMA 12/25/2006   inhaler used 2 days ago   ASTHMA, WITH ACUTE EXACERBATION 05/03/2009   BLEPHARITIS, LEFT 05/21/2008   Cervical disc disease    2 bulging discs to neck    COMMON MIGRAINE 06/14/2007   GANGLION CYST, WRIST, LEFT 10/11/2007   GERD (gastroesophageal reflux disease) 04/24/2012   Headache(784.0) 10/23/2008   HYPERLIPIDEMIA 06/14/2007   LOW BACK PAIN 09/04/2006   Morbid obesity (HCC) 09/04/2006   OTITIS MEDIA, ACUTE, LEFT 03/27/2008   SINUSITIS- ACUTE-NOS 10/11/2007   TENOSYNOVITIS, WRIST 10/11/2007   URI 10/29/2009   URTICARIA 05/03/2009    Patient Active Problem List   Diagnosis Date Noted   Frequency of urination 02/25/2023   Proteinuria 02/25/2023   Tachycardia-bradycardia (HCC) 04/02/2022   Eustachian tube disorder, left 04/02/2022   Candidal vulvovaginitis 03/31/2022   Dysuria 03/31/2022   History of ectopic pregnancy 03/31/2022   Leukorrhea 03/31/2022   Unstable lie with antenatal problem 03/31/2022   Vaginal irritation 03/31/2022   Vulvitis 03/31/2022   Cyst  of ovary, left 03/26/2021   Bursitis of left hip 03/26/2021   Paronychia of finger of right hand 03/26/2021   RUQ pain 02/04/2021   Hypersomnolence 02/04/2021   Allergy to alpha-gal 09/30/2020   Chronic idiopathic urticaria 09/30/2020   Vitamin D deficiency 03/22/2020   Asthma exacerbation 03/07/2019   Acute appendicitis 12/09/2018   STD exposure 02/19/2017   Cough 08/08/2016   Chronic pain of left knee 08/08/2016   Insomnia 02/08/2016   Acute left lumbar radiculopathy 09/16/2015   Hyperglycemia 08/13/2015   Abnormal weight 01/28/2013   GERD (gastroesophageal reflux disease) 04/24/2012   Wheezing 04/17/2011   Encounter for well adult exam with abnormal findings 04/13/2010   Allergic rhinitis 10/30/2009   Acute upper respiratory infection 10/29/2009   DISC DISEASE, CERVICAL 10/29/2009   Hives 05/03/2009   Essential hypertension 10/11/2007   HLD (hyperlipidemia) 06/14/2007   Asthma 12/25/2006   Morbid obesity (HCC) 09/04/2006   Generalized anxiety disorder 09/04/2006   Anxiety with depression 09/04/2006   LOW BACK PAIN 09/04/2006    Past Surgical History:  Procedure Laterality Date   back surgury  01/2003   s/p lumbar disc   CESAREAN SECTION N/A 02/19/2013   Procedure: CESAREAN SECTION;  Surgeon: Michael Litter, MD;  Location: [facility];  Service: Obstetrics;  Laterality: N/A;   LAPAROSCOPIC APPENDECTOMY N/A 12/09/2018   Procedure: APPENDECTOMY LAPAROSCOPIC;  Surgeon: Sung Amabile, DO;  Location: [facility];  Service: General;  Laterality: N/A;   LAPAROSCOPY  04/07/2011   Procedure: LAPAROSCOPY OPERATIVE;  Surgeon: Meriel Pica, MD;  Location: [facility];  Service: Gynecology;  Laterality: N/A;  left salpingogectomy    OB History     Gravida  3   Para  1   Term  1   Preterm      AB  2   Living  1      SAB  1   IAB      Ectopic  1   Multiple      Live Births  1            Home Medications    Prior to Admission medications   Medication Sig Start  Date End Date Taking? Authorizing Provider  cyclobenzaprine (FLEXERIL) 5 MG tablet Take 1 tablet (5 mg total) by mouth 3 (three) times daily as needed for up to 5 days for muscle spasms. 02/25/23 03/02/23 Yes Ryley Bachtel, Para March, NP  albuterol (VENTOLIN HFA) 108 (90 Base) MCG/ACT inhaler Inhale 2 puffs into the lungs every 6 (six) hours as needed. 03/31/22   Corwin Levins, MD  ALPRAZolam Prudy Feeler) 1 MG tablet TAKE 1 TABLET BY MOUTH THREE TIMES A DAY AS NEEDED FOR ANXIETY 01/16/23   Corwin Levins, MD  cetirizine (ZYRTEC) 5 MG tablet Take 10 mg by mouth 2 (two) times daily.    [provider]  EPINEPHrine 0.3 mg/0.3 mL IJ SOAJ injection Inject 0.3 mg into the muscle as needed. Patient not taking: Reported on 03/31/2022 03/31/22   Corwin Levins, MD  fluconazole (DIFLUCAN) 150 MG tablet Take by mouth. Patient not taking: Reported on 03/31/2022    [provider]  fluticasone (FLONASE) 50 MCG/ACT nasal spray Place 1-2 sprays into both nostrils daily. 01/12/20   Wieters, Hallie C, PA-C  Fluticasone-Salmeterol (ADVAIR) 100-50 MCG/DOSE AEPB Inhale 1 puff into the lungs 2 (two) times daily. Patient not taking: Reported on 03/31/2022 03/07/19   Corwin Levins, MD  hydroxychloroquine (PLAQUENIL) 200 MG tablet Take 200 mg by mouth 2 (two) times daily. Patient not taking: Reported on 03/31/2022 07/28/18   [provider]  hydrOXYzine (ATARAX) 50 MG tablet Take 100 mg by mouth at bedtime. Patient not taking: Reported on 05/10/2022 10/08/20   [provider]  levonorgestrel (MIRENA, 52 MG,) 20 MCG/DAY IUD Mirena 20 mcg/24 hours (7 yrs) 52 mg intrauterine device  Take 1 device by intrauterine route as directed.    [provider]  losartan (COZAAR) 50 MG tablet Take 1 tablet (50 mg total) by mouth daily. 05/10/22   Debbe Odea, MD  meloxicam (MOBIC) 15 MG tablet TAKE 1 TABLET BY MOUTH EVERY DAY AS NEEDED FOR PAIN 12/05/22   Corwin Levins, MD  naproxen (NAPROSYN) 500 MG tablet Take 1  tablet (500 mg total) by mouth 2 (two) times daily. Patient not taking: Reported on 03/31/2022 01/12/20   Wieters, Fran Lowes C, PA-C  omalizumab Geoffry Paradise) 150 MG/ML prefilled syringe Inject 300 mg into the skin See admin instructions. INJECT 2 (150 MG) SYRINGES UNDER THE SKIN FOR A TOTAL DOSE OF 300MG  EVERY 4 WEEKS. 06/17/18   [provider]  ondansetron (ZOFRAN) 4 MG tablet Take 1 tablet (4 mg total) by mouth every 8 (eight) hours as needed for nausea or vomiting. Patient not taking: Reported on 05/10/2022 03/23/21   Corwin Levins, MD  pantoprazole (PROTONIX) 40 MG tablet TAKE 1 TABLET BY MOUTH EVERY DAY 07/24/22   Corwin Levins, MD  Phenylephrine-Acetaminophen (TYLENOL SINUS+HEADACHE) 5-325 MG TABS  Take by mouth in the morning and at bedtime.    [provider]  promethazine (PHENERGAN) 25 MG tablet Take 1 tablet (25 mg total) by mouth every 8 (eight) hours as needed for nausea or vomiting. 03/23/21   Corwin Levins, MD  Semaglutide, 1 MG/DOSE, 4 MG/3ML SOPN Inject 1 mg as directed once a week. Patient not taking: Reported on 03/31/2022 04/12/21   Corwin Levins, MD  traMADol (ULTRAM) 50 MG tablet Take 50 mg by mouth every 6 (six) hours as needed. Patient not taking: Reported on 03/31/2022 02/04/20   [provider]  triamcinolone (NASACORT) 55 MCG/ACT AERO nasal inhaler Place 2 sprays into the nose daily. Patient not taking: Reported on 03/31/2022 02/04/21   Corwin Levins, MD  zolpidem (AMBIEN) 10 MG tablet TAKE 1 TABLET BY MOUTH EVERY DAY AT BEDTIME AS NEEDED FOR SLEEP Patient not taking: Reported on 03/31/2022 04/02/21   Corwin Levins, MD  famotidine (PEPCID) 20 MG tablet Take 20 mg by mouth 2 (two) times daily. 08/15/18 01/12/20  [provider]    Family History Family History  Problem Relation Age of Onset   Anxiety disorder Father    Diabetes Father     Social History Social History   Tobacco Use   Smoking status: Former    Current packs/day: 0.50    Average  packs/day: 0.5 packs/day for 16.0 years (8.0 ttl pk-yrs)    Types: Cigarettes   Smokeless tobacco: Never  Vaping Use   Vaping status: Every Day   Substances: Nicotine, Flavoring  Substance Use Topics   Alcohol use: Not Currently   Drug use: No     Allergies   Prozac [fluoxetine hcl]   Review of Systems Review of Systems  Genitourinary:  Positive for dysuria and frequency.  Musculoskeletal:  Positive for back pain and myalgias.  Skin: Negative.   All other systems reviewed and are negative.    Physical Exam Triage Vital Signs ED Triage Vitals  Encounter Vitals Group     BP      Systolic BP Percentile      Diastolic BP Percentile      Pulse      Resp      Temp      Temp src      SpO2      Weight      Height      Head Circumference      Peak Flow      Pain Score      Pain Loc      Pain Education      Exclude from Growth Chart    No data found.  Updated Vital Signs BP 132/87 (BP Location: Left Arm)   Pulse 98   Temp 98.8 F (37.1 C) (Oral)   Resp 18   Ht 5\' 11"  (1.803 m)   Wt (!) 315 lb (142.9 kg)   SpO2 98%   BMI 43.93 kg/m   Visual Acuity Right Eye Distance:   Left Eye Distance:   Bilateral Distance:    Right Eye Near:   Left Eye Near:    Bilateral Near:     Physical Exam Vitals and nursing note reviewed.  Constitutional:      Appearance: Normal appearance. She is well-developed and well-groomed.  HENT:     Head: Normocephalic.  Cardiovascular:     Rate and Rhythm: Normal rate.  Pulmonary:     Effort: Pulmonary effort is normal.  Neurological:  General: No focal deficit present.     Mental Status: She is alert and oriented to person, place, and time.     GCS: GCS eye subscore is 4. GCS verbal subscore is 5. GCS motor subscore is 6.     Cranial Nerves: No cranial nerve deficit.     Sensory: No sensory deficit.     Motor: Motor function is intact.     Coordination: Coordination is intact.     Gait: Gait is intact.  Psychiatric:         Attention and Perception: Attention normal.        Mood and Affect: Mood normal.        Speech: Speech normal.        Behavior: Behavior normal. Behavior is cooperative.      UC Treatments / Results  Labs (all labs ordered are listed, but only abnormal results are displayed) Labs Reviewed  POCT URINALYSIS DIP (MANUAL ENTRY) - Abnormal; Notable for the following components:      Result Value   pH, UA 8.5 (*)    Protein Ur, POC =30 (*)    All other components within normal limits  POCT URINE PREGNANCY - Normal    EKG   Radiology No results found.  Procedures Procedures (including critical care time)  Medications Ordered in UC Medications - No data to display  Initial Impression / Assessment and Plan / UC Course  I have reviewed the triage vital signs and the nursing notes.  Pertinent labs & imaging results that were available during my care of the patient were reviewed by me and considered in my medical decision making (see chart for details).    Discussed exam findings and plan of care with patient, strict go to ER precautions given.   Patient verbalized understanding to this provider.  Ddx: Acute left-sided low back pain with sciatica, dysuria, frequency of urination, protein urea Final Clinical Impressions(s) / UC Diagnoses   Final diagnoses:  Acute left-sided low back pain with bilateral sciatica  Dysuria  Frequency of urination  Proteinuria, unspecified type     Discharge Instructions      Flexeril for muscle spasm Tylenol/ibuprofen as label directed for pain Continue home meds If you develop saddle numbness, loss of function, loss of bowel and bladder-call 911/go to ER for further evaluation May use biofreeze,lidocaine patch, ice 20 min 3 x daily     ED Prescriptions     Medication Sig Dispense Auth. Provider   cyclobenzaprine (FLEXERIL) 5 MG tablet Take 1 tablet (5 mg total) by mouth 3 (three) times daily as needed for up to 5 days for  muscle spasms. 15 tablet Amman Bartel, Para March, NP      PDMP not reviewed this encounter.   Clancy Gourd, NP 02/25/23 [phone]

## 2023-02-25 NOTE — ED Triage Notes (Signed)
Patient states left sided sciatica for over 1 week, treated for UTI for over a week ago and still having frequency and dysuria.  On steroids currently for hives, sees an allergist.

## 2023-02-25 NOTE — Discharge Instructions (Addendum)
Flexeril for muscle spasm Tylenol/ibuprofen as label directed for pain Continue home meds If you develop saddle numbness, loss of function, loss of bowel and bladder-call 911/go to ER for further evaluation May use biofreeze,lidocaine patch, ice 20 min 3 x daily

## 2023-04-06 ENCOUNTER — Encounter: Payer: Managed Care, Other (non HMO) | Admitting: Internal Medicine

## 2023-04-13 ENCOUNTER — Encounter: Payer: Self-pay | Admitting: Internal Medicine

## 2023-04-13 ENCOUNTER — Ambulatory Visit (INDEPENDENT_AMBULATORY_CARE_PROVIDER_SITE_OTHER): Admitting: Internal Medicine

## 2023-04-13 VITALS — BP 134/78 | HR 101 | Temp 98.6°F | Ht 71.0 in | Wt 328.0 lb

## 2023-04-13 DIAGNOSIS — M5416 Radiculopathy, lumbar region: Secondary | ICD-10-CM | POA: Diagnosis not present

## 2023-04-13 DIAGNOSIS — I10 Essential (primary) hypertension: Secondary | ICD-10-CM

## 2023-04-13 DIAGNOSIS — Z0001 Encounter for general adult medical examination with abnormal findings: Secondary | ICD-10-CM

## 2023-04-13 DIAGNOSIS — E538 Deficiency of other specified B group vitamins: Secondary | ICD-10-CM

## 2023-04-13 DIAGNOSIS — R739 Hyperglycemia, unspecified: Secondary | ICD-10-CM | POA: Diagnosis not present

## 2023-04-13 DIAGNOSIS — E7800 Pure hypercholesterolemia, unspecified: Secondary | ICD-10-CM | POA: Diagnosis not present

## 2023-04-13 DIAGNOSIS — E559 Vitamin D deficiency, unspecified: Secondary | ICD-10-CM

## 2023-04-13 DIAGNOSIS — F411 Generalized anxiety disorder: Secondary | ICD-10-CM

## 2023-04-13 DIAGNOSIS — R41840 Attention and concentration deficit: Secondary | ICD-10-CM | POA: Diagnosis not present

## 2023-04-13 LAB — CBC WITH DIFFERENTIAL/PLATELET
Basophils Absolute: 0.1 10*3/uL (ref 0.0–0.1)
Basophils Relative: 0.6 % (ref 0.0–3.0)
Eosinophils Absolute: 0.1 10*3/uL (ref 0.0–0.7)
Eosinophils Relative: 1.1 % (ref 0.0–5.0)
HCT: 42.9 % (ref 36.0–46.0)
Hemoglobin: 14.7 g/dL (ref 12.0–15.0)
Lymphocytes Relative: 40.4 % (ref 12.0–46.0)
Lymphs Abs: 3.8 10*3/uL (ref 0.7–4.0)
MCHC: 34.4 g/dL (ref 30.0–36.0)
MCV: 94.8 fl (ref 78.0–100.0)
Monocytes Absolute: 0.9 10*3/uL (ref 0.1–1.0)
Monocytes Relative: 9.2 % (ref 3.0–12.0)
Neutro Abs: 4.6 10*3/uL (ref 1.4–7.7)
Neutrophils Relative %: 48.7 % (ref 43.0–77.0)
Platelets: 255 10*3/uL (ref 150.0–400.0)
RBC: 4.53 Mil/uL (ref 3.87–5.11)
RDW: 12.7 % (ref 11.5–15.5)
WBC: 9.5 10*3/uL (ref 4.0–10.5)

## 2023-04-13 LAB — URINALYSIS, ROUTINE W REFLEX MICROSCOPIC
Bilirubin Urine: NEGATIVE
Hgb urine dipstick: NEGATIVE
Leukocytes,Ua: NEGATIVE
Nitrite: NEGATIVE
RBC / HPF: NONE SEEN (ref 0–?)
Specific Gravity, Urine: >=1.03 — AB (ref 1.000–1.030)
Total Protein, Urine: NEGATIVE
Urine Glucose: NEGATIVE
Urobilinogen, UA: 0.2 (ref 0.0–1.0)
pH: 6 (ref 5.0–8.0)

## 2023-04-13 LAB — HEPATIC FUNCTION PANEL
ALT: 18 U/L (ref 0–35)
AST: 16 U/L (ref 0–37)
Albumin: 4.5 g/dL (ref 3.5–5.2)
Alkaline Phosphatase: 36 U/L — ABNORMAL LOW (ref 39–117)
Bilirubin, Direct: 0.1 mg/dL (ref 0.0–0.3)
Total Bilirubin: 0.4 mg/dL (ref 0.2–1.2)
Total Protein: 7.2 g/dL (ref 6.0–8.3)

## 2023-04-13 LAB — LIPID PANEL
Cholesterol: 181 mg/dL (ref 0–200)
HDL: 43.9 mg/dL (ref >39.00)
LDL Cholesterol: 105 mg/dL — ABNORMAL HIGH (ref 0–99)
NonHDL: 137.22
Total CHOL/HDL Ratio: 4
Triglycerides: 159 mg/dL — ABNORMAL HIGH (ref 0.0–149.0)
VLDL: 31.8 mg/dL (ref 0.0–40.0)

## 2023-04-13 LAB — BASIC METABOLIC PANEL WITH GFR
BUN: 13 mg/dL (ref 6–23)
CO2: 27 meq/L (ref 19–32)
Calcium: 9.4 mg/dL (ref 8.4–10.5)
Chloride: 105 meq/L (ref 96–112)
Creatinine, Ser: 0.66 mg/dL (ref 0.40–1.20)
GFR: 112.53 mL/min (ref >60.00)
Glucose, Bld: 87 mg/dL (ref 70–99)
Potassium: 4.2 meq/L (ref 3.5–5.1)
Sodium: 140 meq/L (ref 135–145)

## 2023-04-13 LAB — VITAMIN D 25 HYDROXY (VIT D DEFICIENCY, FRACTURES): VITD: 47.25 ng/mL (ref 30.00–100.00)

## 2023-04-13 LAB — HEMOGLOBIN A1C: Hgb A1c MFr Bld: 5.5 % (ref 4.6–6.5)

## 2023-04-13 LAB — TSH: TSH: 2.14 u[IU]/mL (ref 0.35–5.50)

## 2023-04-13 LAB — VITAMIN B12: Vitamin B-12: 425 pg/mL (ref 211–911)

## 2023-04-13 MED ORDER — PANTOPRAZOLE SODIUM 40 MG PO TBEC: 40.0000 mg | DELAYED_RELEASE_TABLET | Freq: Every day | ORAL | 0 refills | Status: AC

## 2023-04-13 MED ORDER — PREDNISONE 10 MG PO TABS: ORAL_TABLET | ORAL | 0 refills | Status: AC

## 2023-04-13 MED ORDER — ALPRAZOLAM 1 MG PO TABS: ORAL_TABLET | ORAL | 2 refills | Status: DC

## 2023-04-13 MED ORDER — PROMETHAZINE HCL 25 MG PO TABS: 25.0000 mg | ORAL_TABLET | Freq: Three times a day (TID) | ORAL | 2 refills | Status: AC | PRN

## 2023-04-13 NOTE — Progress Notes (Signed)
 The test results show that your current treatment is OK, as the tests are stable.  Please continue the same plan.  There is no other need for change of treatment or further evaluation based on these results, at this time.  thanks

## 2023-04-13 NOTE — Patient Instructions (Signed)
 Please take all new medication as prescribed - the prednisone  Please continue all other medications as before, and refills have been done if requested.  Please have the pharmacy call with any other refills you may need.  Please continue your efforts at being more active, low cholesterol diet, and weight control.  You are otherwise up to date with prevention measures today.  Please keep your appointments with your specialists as you may have planned  You will be contacted regarding the referral for: ADD clinic  Please go to the LAB at the blood drawing area for the tests to be done  You will be contacted by phone if any changes need to be made immediately.  Otherwise, you will receive a letter about your results with an explanation, but please check with MyChart first.  Please make an Appointment to return for your 1 year visit, or sooner if needed

## 2023-04-13 NOTE — Progress Notes (Signed)
 Patient ID: [name], female   DOB: 1985/12/01, 38 y.o.   MRN: [id number]         Chief Complaint:: wellness exam and Annual Exam (Says something is wrong with her fingernails on both hands , as a fall January 2nd and hurt her pinky and wants to get that checked, wants to get checked to see if she has ADHD,says her left leg has been going numb and she thinks her leg is not as strong and that could be why she has had those 3 falls this year )  , anxiety panic, low vit d       HPI:  CARLINDA OHLSON is a 38 y.o. female here for wellness exam; pt will call for Gyn f/u appt; declines pneumovax, o/w up to date                        Also has fallen x 3 in the past yr, 1 over a baby gate, 1 while working and stepping a hole in the yard with left ankle roll and fall, and other with unstable concrete where she lost balance and fell.  Pt denies chest pain, increased sob or doe, wheezing, orthopnea, PND, increased LE swelling, palpitations, dizziness or syncope.   Pt denies polydipsia, polyuria,    Pt denies fever, wt loss, night sweats, loss of appetite, or other constitutional symptoms  Has had some lifiting off of the nail beds of several fingernails small areas, wondering if this is a problem.  .  Also wears a heavy 5 gallon tank on a back pack and now with LUE paresthesias daily starting at the left neck but no pain or weakness.  Does also have similar but worsening pain to the lumbar lower back with LLE radiation of pain, numbness and mild weakness intermittent.  Also has hx of ADD in family, and is now asking for ADD eval referral.  Has had multiple stressors at home recently, one of which was step-father taken off life support last night, Denies worsening depressive symptoms, suicidal ideation, or panic; has ongoing anxiety and has a panic attack last pm.     Wt Readings from Last 3 Encounters:  04/13/23 (!) 328 lb (148.8 kg)  02/25/23 (!) 315 lb (142.9 kg)  05/10/22 (!) 359 lb (162.8  kg)   BP Readings from Last 3 Encounters:  04/13/23 134/78  02/25/23 132/87  05/10/22 (!) 140/94   Immunization History  Administered Date(s) Administered   Influenza Whole 11/12/2006, 10/29/2009   Influenza,inj,Quad PF,6+ Mos 11/14/2012, 02/22/2018   Influenza-Unspecified 11/14/2012, 10/23/2018   Pneumococcal Polysaccharide-23 02/20/2013   Tdap 12/12/2012, 02/19/2017   Health Maintenance Due  Topic Date Due   Cervical Cancer Screening (HPV/Pap Cotest)  Never done      Past Medical History:  Diagnosis Date   ALLERGIC RHINITIS 10/30/2009   ANXIETY 09/04/2006   ASTHMA 12/25/2006   inhaler used 2 days ago   ASTHMA, WITH ACUTE EXACERBATION 05/03/2009   BLEPHARITIS, LEFT 05/21/2008   Cervical disc disease    2 bulging discs to neck    COMMON MIGRAINE 06/14/2007   GANGLION CYST, WRIST, LEFT 10/11/2007   GERD (gastroesophageal reflux disease) 04/24/2012   Headache(784.0) 10/23/2008   HYPERLIPIDEMIA 06/14/2007   LOW BACK PAIN 09/04/2006   Morbid obesity (HCC) 09/04/2006   OTITIS MEDIA, ACUTE, LEFT 03/27/2008   SINUSITIS- ACUTE-NOS 10/11/2007   TENOSYNOVITIS, WRIST 10/11/2007   URI 10/29/2009   URTICARIA 05/03/2009   Past Surgical  History:  Procedure Laterality Date   back surgury  01/2003   s/p lumbar disc   CESAREAN SECTION N/A 02/19/2013   Procedure: CESAREAN SECTION;  Surgeon: Michael Litter, MD;  Location: [facility];  Service: Obstetrics;  Laterality: N/A;   LAPAROSCOPIC APPENDECTOMY N/A 12/09/2018   Procedure: APPENDECTOMY LAPAROSCOPIC;  Surgeon: Sung Amabile, DO;  Location: [facility];  Service: General;  Laterality: N/A;   LAPAROSCOPY  04/07/2011   Procedure: LAPAROSCOPY OPERATIVE;  Surgeon: Meriel Pica, MD;  Location: [facility];  Service: Gynecology;  Laterality: N/A;  left salpingogectomy    reports that she has quit smoking. Her smoking use included cigarettes. She has a 8 pack-year smoking history. She has never used smokeless tobacco. She reports that she does not currently  use alcohol. She reports that she does not use drugs. family history includes Anxiety disorder in her father; Diabetes in her father. Allergies  Allergen Reactions   Ambien [Zolpidem]     Feels bad   Prozac [Fluoxetine Hcl] Other (See Comments)   Current Outpatient Medications on File Prior to Visit  Medication Sig Dispense Refill   albuterol (VENTOLIN HFA) 108 (90 Base) MCG/ACT inhaler Inhale 2 puffs into the lungs every 6 (six) hours as needed. 25.5 each 2   cetirizine (ZYRTEC) 5 MG tablet Take 10 mg by mouth 2 (two) times daily.     EPINEPHrine 0.3 mg/0.3 mL IJ SOAJ injection Inject 0.3 mg into the muscle as needed. 1 each 5   fluticasone (FLONASE) 50 MCG/ACT nasal spray Place 1-2 sprays into both nostrils daily. 16 g 0   levonorgestrel (MIRENA, 52 MG,) 20 MCG/DAY IUD Mirena 20 mcg/24 hours (7 yrs) 52 mg intrauterine device  Take 1 device by intrauterine route as directed.     losartan (COZAAR) 50 MG tablet Take 1 tablet (50 mg total) by mouth daily. 90 tablet 3   meloxicam (MOBIC) 15 MG tablet TAKE 1 TABLET BY MOUTH EVERY DAY AS NEEDED FOR PAIN 90 tablet 1   omalizumab (XOLAIR) 150 MG/ML prefilled syringe Inject 300 mg into the skin See admin instructions. INJECT 2 (150 MG) SYRINGES UNDER THE SKIN FOR A TOTAL DOSE OF 300MG  EVERY 4 WEEKS.     fluconazole (DIFLUCAN) 150 MG tablet Take by mouth. (Patient not taking: Reported on 04/13/2023)     Fluticasone-Salmeterol (ADVAIR) 100-50 MCG/DOSE AEPB Inhale 1 puff into the lungs 2 (two) times daily. (Patient not taking: Reported on 04/13/2023) 3 each 3   hydroxychloroquine (PLAQUENIL) 200 MG tablet Take 200 mg by mouth 2 (two) times daily.     [DISCONTINUED] famotidine (PEPCID) 20 MG tablet Take 20 mg by mouth 2 (two) times daily.     No current facility-administered medications on file prior to visit.        ROS:  All others reviewed and negative.  Objective        PE:  BP 134/78 (BP Location: Left Arm, Patient Position: Sitting, Cuff Size:  Normal)   Pulse (!) 101   Temp 98.6 F (37 C) (Oral)   Ht 5\' 11"  (1.803 m)   Wt (!) 328 lb (148.8 kg)   SpO2 99%   BMI 45.75 kg/m                 Constitutional: Pt appears in NAD               HENT: Head: NCAT.                Right  Ear: External ear normal.                 Left Ear: External ear normal.                Eyes: . Pupils are equal, round, and reactive to light. Conjunctivae and EOM are normal               Nose: without d/c or deformity               Neck: Neck supple. Gross normal ROM               Cardiovascular: Normal rate and regular rhythm.                 Pulmonary/Chest: Effort normal and breath sounds without rales or wheezing.                Abd:  Soft, NT, ND, + BS, no organomegaly               Neurological: Pt is alert. At baseline orientation, motor with 4+ /5 LLE Weakness; spine nontender in the midline but has mild tender left paraspinal without spasm noted               Skin: Skin is warm. No rashes, no other new lesions, LE edema - none               Psychiatric: Pt behavior is normal without agitation   Micro: none  Cardiac tracings I have personally interpreted today:  none  Pertinent Radiological findings (summarize): none   Lab Results  Component Value Date   WBC 9.5 04/13/2023   HGB 14.7 04/13/2023   HCT 42.9 04/13/2023   PLT 255.0 04/13/2023   GLUCOSE 87 04/13/2023   CHOL 181 04/13/2023   TRIG 159.0 (H) 04/13/2023   HDL 43.90 04/13/2023   LDLDIRECT 113.0 02/22/2018   LDLCALC 105 (H) 04/13/2023   ALT 18 04/13/2023   AST 16 04/13/2023   NA 140 04/13/2023   K 4.2 04/13/2023   CL 105 04/13/2023   CREATININE 0.66 04/13/2023   BUN 13 04/13/2023   CO2 27 04/13/2023   TSH 2.14 04/13/2023   HGBA1C 5.5 04/13/2023   Assessment/Plan:  MAIRA CHRISTON is a 38 y.o. White or Caucasian [1] female with  has a past medical history of ALLERGIC RHINITIS (10/30/2009), ANXIETY (09/04/2006), ASTHMA (12/25/2006), ASTHMA, WITH ACUTE  EXACERBATION (05/03/2009), BLEPHARITIS, LEFT (05/21/2008), Cervical disc disease, COMMON MIGRAINE (06/14/2007), GANGLION CYST, WRIST, LEFT (10/11/2007), GERD (gastroesophageal reflux disease) (04/24/2012), Headache(784.0) (10/23/2008), HYPERLIPIDEMIA (06/14/2007), LOW BACK PAIN (09/04/2006), Morbid obesity (HCC) (09/04/2006), OTITIS MEDIA, ACUTE, LEFT (03/27/2008), SINUSITIS- ACUTE-NOS (10/11/2007), TENOSYNOVITIS, WRIST (10/11/2007), URI (10/29/2009), and URTICARIA (05/03/2009).  Encounter for well adult exam with abnormal findings Age and sex appropriate education and counseling updated with regular exercise and diet Referrals for preventative services - pt to call for GYN appt Immunizations addressed - declines pneumovax Smoking counseling  - none needed Evidence for depression or other mood disorder - chronic anxiety panic worsening recently - for xanax prn Most recent labs reviewed. I have personally reviewed and have noted: 1) the patient's medical and social history 2) The patient's current medications and supplements 3) The patient's height, weight, and BMI have been recorded in the chart   HLD (hyperlipidemia) Lab Results  Component Value Date   LDLCALC 105 (H) 04/13/2023   Uncontrolled, for lower chol diet, declines statin    Generalized anxiety disorder With panic  at times recent, for xanax prn   Essential hypertension BP Readings from Last 3 Encounters:  04/13/23 134/78  02/25/23 132/87  05/10/22 (!) 140/94   Stable, pt to continue medical treatment losartan 50 mg qd   Hyperglycemia Lab Results  Component Value Date   HGBA1C 5.5 04/13/2023   Stable, pt to continue current medical treatment  - diet, wt control   Vitamin D deficiency Last vitamin D Lab Results  Component Value Date   VD25OH 47.25 04/13/2023   Stable, cont oral replacement   Acute left lumbar radiculopathy Mild to mod, for prednisone taper, declines MRI,,  to f/u any worsening symptoms or  concerns  Lack of concentration ? ADD - for ADD clinic referral  Followup: Return in about 1 year (around 04/12/2024).  Oliver Barre, MD 04/15/2023 7:01 PM  [facility]

## 2023-04-15 ENCOUNTER — Encounter: Payer: Self-pay | Admitting: Internal Medicine

## 2023-04-15 DIAGNOSIS — R41840 Attention and concentration deficit: Secondary | ICD-10-CM | POA: Insufficient documentation

## 2023-04-15 NOTE — Assessment & Plan Note (Signed)
 Last vitamin D Lab Results  Component Value Date   VD25OH 47.25 04/13/2023   Stable, cont oral replacement

## 2023-04-15 NOTE — Assessment & Plan Note (Signed)
?   ADD - for ADD clinic referral

## 2023-04-15 NOTE — Assessment & Plan Note (Signed)
 Mild to mod, for prednisone taper, declines MRI,,  to f/u any worsening symptoms or concerns

## 2023-04-15 NOTE — Assessment & Plan Note (Signed)
 With panic at times recent, for xanax prn

## 2023-04-15 NOTE — Assessment & Plan Note (Signed)
 Lab Results  Component Value Date   LDLCALC 105 (H) 04/13/2023   Uncontrolled, for lower chol diet, declines statin

## 2023-04-15 NOTE — Assessment & Plan Note (Signed)
 Age and sex appropriate education and counseling updated with regular exercise and diet Referrals for preventative services - pt to call for GYN appt Immunizations addressed - declines pneumovax Smoking counseling  - none needed Evidence for depression or other mood disorder - chronic anxiety panic worsening recently - for xanax prn Most recent labs reviewed. I have personally reviewed and have noted: 1) the patient's medical and social history 2) The patient's current medications and supplements 3) The patient's height, weight, and BMI have been recorded in the chart

## 2023-04-15 NOTE — Assessment & Plan Note (Signed)
 Lab Results  Component Value Date   HGBA1C 5.5 04/13/2023   Stable, pt to continue current medical treatment  - diet, wt control

## 2023-04-15 NOTE — Assessment & Plan Note (Signed)
 BP Readings from Last 3 Encounters:  04/13/23 134/78  02/25/23 132/87  05/10/22 (!) 140/94   Stable, pt to continue medical treatment losartan 50 mg qd

## 2023-07-08 ENCOUNTER — Other Ambulatory Visit: Payer: Self-pay | Admitting: Cardiology

## 2023-07-09 NOTE — Telephone Encounter (Signed)
 LVM to schedule fu appt/refills

## 2023-07-09 NOTE — Telephone Encounter (Signed)
 Please contact pt for future appointment. Pt overdue for 3 month f/u with Dr. Budd Kindle. Pt needing refills.

## 2023-07-11 ENCOUNTER — Other Ambulatory Visit: Payer: Self-pay | Admitting: Internal Medicine

## 2023-07-17 NOTE — Telephone Encounter (Signed)
 Left voice mail to schedule appt

## 2023-07-24 ENCOUNTER — Encounter: Payer: Self-pay | Admitting: Cardiology

## 2023-07-24 NOTE — Telephone Encounter (Signed)
 Called 3x, unable to reach letter sent  via mail

## 2023-08-03 ENCOUNTER — Other Ambulatory Visit: Payer: Self-pay | Admitting: Cardiology

## 2023-08-07 ENCOUNTER — Telehealth: Payer: Self-pay | Admitting: Cardiology

## 2023-08-07 ENCOUNTER — Other Ambulatory Visit: Payer: Self-pay | Admitting: Cardiology

## 2023-08-07 NOTE — Telephone Encounter (Signed)
*  STAT* If patient is at the pharmacy, call can be transferred to refill team.   1. Which medications need to be refilled? (please list name of each medication and dose if known)   losartan  (COZAAR ) 50 MG tablet    2. Which pharmacy/location (including street and city if local pharmacy) is medication to be sent to?  [id number] - [place], [place] - [address]    3. Do they need a 30 day or 90 day supply? 90   Patient has appt 9/11

## 2023-08-08 ENCOUNTER — Other Ambulatory Visit: Payer: Self-pay | Admitting: Internal Medicine

## 2023-08-08 MED ORDER — LOSARTAN POTASSIUM 50 MG PO TABS: 50.0000 mg | ORAL_TABLET | Freq: Every day | ORAL | 1 refills | Status: DC

## 2023-08-08 NOTE — Telephone Encounter (Signed)
 Rx sent to pharmacy

## 2023-09-20 ENCOUNTER — Ambulatory Visit: Attending: Cardiology | Admitting: Cardiology

## 2023-09-20 ENCOUNTER — Encounter: Payer: Self-pay | Admitting: Cardiology

## 2023-09-20 VITALS — BP 122/78 | HR 89 | Ht 71.0 in | Wt 340.0 lb

## 2023-09-20 DIAGNOSIS — I10 Essential (primary) hypertension: Secondary | ICD-10-CM | POA: Diagnosis not present

## 2023-09-20 NOTE — Progress Notes (Signed)
 Cardiology Office Note:    Date:  09/20/2023   ID:  Martha Thompson, DOB 04-03-1985, MRN [id number]  PCP:  Norleen Lynwood ORN, MD   [facility] Providers Cardiologist:  Redell Cave, MD     Referring MD: Norleen Lynwood ORN, MD   Chief Complaint  Patient presents with   Follow-up    6 month follow up pt has been doing well with no complaints of chest pain, chest pressure or SOB, medciation reviewed verbally with patient     History of Present Illness:    Martha Thompson is a 38 y.o. female with a hx of hypertension, morbid obesity who presents for follow-up.   Last seen due to hypertension, losartan  increased to 50 mg daily with good effect.  Ready losartan .  BP adequately controlled at home.  Feels well, has no concerns at this time.     Past Medical History:  Diagnosis Date   ALLERGIC RHINITIS 10/30/2009   ANXIETY 09/04/2006   ASTHMA 12/25/2006   inhaler used 2 days ago   ASTHMA, WITH ACUTE EXACERBATION 05/03/2009   BLEPHARITIS, LEFT 05/21/2008   Cervical disc disease    2 bulging discs to neck    COMMON MIGRAINE 06/14/2007   GANGLION CYST, WRIST, LEFT 10/11/2007   GERD (gastroesophageal reflux disease) 04/24/2012   Headache(784.0) 10/23/2008   HYPERLIPIDEMIA 06/14/2007   LOW BACK PAIN 09/04/2006   Morbid obesity (HCC) 09/04/2006   OTITIS MEDIA, ACUTE, LEFT 03/27/2008   SINUSITIS- ACUTE-NOS 10/11/2007   TENOSYNOVITIS, WRIST 10/11/2007   URI 10/29/2009   URTICARIA 05/03/2009    Past Surgical History:  Procedure Laterality Date   back surgury  01/2003   s/p lumbar disc   CESAREAN SECTION N/A 02/19/2013   Procedure: CESAREAN SECTION;  Surgeon: Ovid DELENA All, MD;  Location: [facility];  Service: Obstetrics;  Laterality: N/A;   LAPAROSCOPIC APPENDECTOMY N/A 12/09/2018   Procedure: APPENDECTOMY LAPAROSCOPIC;  Surgeon: Tye Millet, DO;  Location: [facility];  Service: General;  Laterality: N/A;   LAPAROSCOPY  04/07/2011   Procedure: LAPAROSCOPY OPERATIVE;  Surgeon:  Charlie CHRISTELLA Croak, MD;  Location: [facility];  Service: Gynecology;  Laterality: N/A;  left salpingogectomy    Current Medications: Current Meds  Medication Sig   albuterol  (VENTOLIN  HFA) 108 (90 Base) MCG/ACT inhaler Inhale 2 puffs into the lungs every 6 (six) hours as needed.   ALPRAZolam  (XANAX ) 1 MG tablet TAKE 1 TABLET BY MOUTH THREE TIMES A DAY AS NEEDED FOR ANXIETY   cetirizine  (ZYRTEC ) 5 MG tablet Take 10 mg by mouth 2 (two) times daily.   EPINEPHrine  0.3 mg/0.3 mL IJ SOAJ injection Inject 0.3 mg into the muscle as needed.   fluticasone  (FLONASE ) 50 MCG/ACT nasal spray Place 1-2 sprays into both nostrils daily.   hydroxychloroquine  (PLAQUENIL ) 200 MG tablet Take 200 mg by mouth 2 (two) times daily.   levonorgestrel (MIRENA, 52 MG,) 20 MCG/DAY IUD Mirena 20 mcg/24 hours (7 yrs) 52 mg intrauterine device  Take 1 device by intrauterine route as directed.   losartan  (COZAAR ) 50 MG tablet Take 1 tablet (50 mg total) by mouth daily.   meloxicam  (MOBIC ) 15 MG tablet TAKE 1 TABLET BY MOUTH EVERY DAY AS NEEDED FOR PAIN   omalizumab (XOLAIR) 150 MG/ML prefilled syringe Inject 300 mg into the skin See admin instructions. INJECT 2 (150 MG) SYRINGES UNDER THE SKIN FOR A TOTAL DOSE OF 300MG  EVERY 4 WEEKS.   predniSONE  (DELTASONE ) 10 MG tablet 3 tabs by mouth per day for  3 days,2tabs per day for 3 days,1tab per day for 3 days   promethazine  (PHENERGAN ) 25 MG tablet Take 1 tablet (25 mg total) by mouth every 8 (eight) hours as needed for nausea or vomiting.     Allergies:   Ambien  [facility] ] and Prozac  [fluoxetine  hcl]   Social History   Socioeconomic History   Marital status: Married    Spouse name: Not on file   Number of children: Not on file   Years of education: Not on file   Highest education level: Not on file  Occupational History    Employer: [employer]    Comment: 12 hour days  Tobacco Use   Smoking status: Former    Current packs/day: 0.50    Average packs/day: 0.5 packs/day for  16.0 years (8.0 ttl pk-yrs)    Types: Cigarettes   Smokeless tobacco: Never  Vaping Use   Vaping status: Every Day   Substances: Nicotine, Flavoring  Substance and Sexual Activity   Alcohol use: Not Currently   Drug use: No   Sexual activity: Not on file  Other Topics Concern   Not on file  Social History Narrative   Married.   2 children.   Works for [employer].   Enjoys spending time outdoors.   Social Drivers of Corporate investment banker Strain: Not on file  Food Insecurity: Not on file  Transportation Needs: Not on file  Physical Activity: Not on file  Stress: Not on file  Social Connections: Not on file     Family History: The patient's family history includes Anxiety disorder in her father; Diabetes in her father.  ROS:   Please see the history of present illness.    All other systems reviewed and are negative.  EKGs/Labs/Other Studies Reviewed:    The following studies were reviewed today:   Recent Labs: 04/13/2023: ALT 18; BUN 13; Creatinine, Ser 0.66; Hemoglobin 14.7; Platelets 255.0; Potassium 4.2; Sodium 140; TSH 2.14  Recent Lipid Panel    Component Value Date/Time   CHOL 181 04/13/2023 1538   TRIG 159.0 (H) 04/13/2023 1538   HDL 43.90 04/13/2023 1538   CHOLHDL 4 04/13/2023 1538   VLDL 31.8 04/13/2023 1538   LDLCALC 105 (H) 04/13/2023 1538   LDLDIRECT 113.0 02/22/2018 1508     Risk Assessment/Calculations:           Physical Exam:    VS:  BP 122/78 (BP Location: Left Arm, Patient Position: Sitting, Cuff Size: Normal)   Pulse 89   Ht 5' 11 (1.803 m)   Wt (!) 340 lb (154.2 kg)   SpO2 97%   BMI 47.42 kg/m     Wt Readings from Last 3 Encounters:  09/20/23 (!) 340 lb (154.2 kg)  04/13/23 (!) 328 lb (148.8 kg)  02/25/23 (!) 315 lb (142.9 kg)     GEN:  Well nourished, well developed in no acute distress HEENT: Normal NECK: No JVD; No carotid bruits CARDIAC: RRR, no murmurs, rubs, gallops RESPIRATORY:  Clear to auscultation without  rales, wheezing or rhonchi  ABDOMEN: Soft, non-tender, non-distended MUSCULOSKELETAL:  No edema; No deformity  SKIN: Warm and dry NEUROLOGIC:  Alert and oriented x 3 PSYCHIATRIC:  Normal affect   ASSESSMENT:    1. Primary hypertension   2. Morbid obesity (HCC)    PLAN:    In order of problems listed above:  Hypertension, BP controlled.  Continue losartan  50 mg daily. Morbid obesity, low-calorie diet, weight loss advised.      Follow-up  as needed  Medication Adjustments/Labs and Tests Ordered: Current medicines are reviewed at length with the patient today.  Concerns regarding medicines are outlined above.  No orders of the defined types were placed in this encounter.  No orders of the defined types were placed in this encounter.   There are no Patient Instructions on file for this visit.   Signed, Redell Cave, MD  09/20/2023 1:20 PM    [facility]

## 2023-09-25 ENCOUNTER — Encounter: Payer: Self-pay | Admitting: Internal Medicine

## 2023-10-05 ENCOUNTER — Other Ambulatory Visit: Payer: Self-pay | Admitting: Cardiology

## 2023-10-10 ENCOUNTER — Other Ambulatory Visit: Payer: Self-pay | Admitting: Internal Medicine

## 2023-12-21 ENCOUNTER — Ambulatory Visit: Admission: RE | Admit: 2023-12-21 | Discharge: 2023-12-21 | Disposition: A | Discharge status: Home / Self Care

## 2023-12-21 VITALS — BP 139/88 | HR 108 | Temp 98.6°F | Resp 18

## 2023-12-21 DIAGNOSIS — Z8619 Personal history of other infectious and parasitic diseases: Secondary | ICD-10-CM

## 2023-12-21 DIAGNOSIS — J0100 Acute maxillary sinusitis, unspecified: Secondary | ICD-10-CM

## 2023-12-21 LAB — POC COVID19/FLU A&B COMBO
Covid Antigen, POC: NEGATIVE
Influenza A Antigen, POC: NEGATIVE
Influenza B Antigen, POC: NEGATIVE

## 2023-12-21 LAB — POCT RAPID STREP A (OFFICE): Rapid Strep A Screen: NEGATIVE

## 2023-12-21 MED ORDER — FLUCONAZOLE 150 MG PO TABS: 150.0000 mg | ORAL_TABLET | Freq: Once | ORAL | 0 refills | Status: AC

## 2023-12-21 MED ORDER — AMOXICILLIN-POT CLAVULANATE 875-125 MG PO TABS: 1.0000 | ORAL_TABLET | Freq: Two times a day (BID) | ORAL | 0 refills | Status: AC

## 2023-12-21 NOTE — ED Provider Notes (Signed)
 Martha Thompson    CSN: [id number] Arrival date & time: 12/21/23  1800      History   Chief Complaint Chief Complaint  Patient presents with   Sore Throat    Dry cough, sore throat, chills, headache, tight chest - Entered by patient    HPI Martha Thompson is a 38 y.o. female.  Patient presents with 2-week history of congestion and cough with occasional production of thick yellow mucus.  Today she woke up with hoarse voice and fever.  Tmax 102.  She has been taking Tylenol  for her fever today.  No shortness of breath, vomiting, diarrhea.  The history is provided by the patient and medical records.    Past Medical History:  Diagnosis Date   ALLERGIC RHINITIS 10/30/2009   ANXIETY 09/04/2006   ASTHMA 12/25/2006   inhaler used 2 days ago   ASTHMA, WITH ACUTE EXACERBATION 05/03/2009   BLEPHARITIS, LEFT 05/21/2008   Cervical disc disease    2 bulging discs to neck    COMMON MIGRAINE 06/14/2007   GANGLION CYST, WRIST, LEFT 10/11/2007   GERD (gastroesophageal reflux disease) 04/24/2012   Headache(784.0) 10/23/2008   HYPERLIPIDEMIA 06/14/2007   LOW BACK PAIN 09/04/2006   Mast cell activation    Morbid obesity (HCC) 09/04/2006   OTITIS MEDIA, ACUTE, LEFT 03/27/2008   SINUSITIS- ACUTE-NOS 10/11/2007   TENOSYNOVITIS, WRIST 10/11/2007   URI 10/29/2009   URTICARIA 05/03/2009    Patient Active Problem List   Diagnosis Date Noted   Lack of concentration 04/15/2023   Frequency of urination 02/25/2023   Proteinuria 02/25/2023   Non-seasonal allergic rhinitis due to pollen 05/09/2022   Tachycardia-bradycardia (HCC) 04/02/2022   Eustachian tube disorder, left 04/02/2022   Candidal vulvovaginitis 03/31/2022   Dysuria 03/31/2022   History of ectopic pregnancy 03/31/2022   Leukorrhea 03/31/2022   Unstable lie with antenatal problem 03/31/2022   Vaginal irritation 03/31/2022   Vulvitis 03/31/2022   Cyst of ovary, left 03/26/2021   Bursitis of left hip 03/26/2021    Paronychia of finger of right hand 03/26/2021   RUQ pain 02/04/2021   Hypersomnolence 02/04/2021   Allergy to alpha-gal 09/30/2020   Chronic idiopathic urticaria 09/30/2020   Vitamin D  deficiency 03/22/2020   Asthma exacerbation 03/07/2019   Acute appendicitis 12/09/2018   STD exposure 02/19/2017   Cough 08/08/2016   Chronic pain of left knee 08/08/2016   Insomnia 02/08/2016   Acute left lumbar radiculopathy 09/16/2015   Hyperglycemia 08/13/2015   Abnormal weight 01/28/2013   GERD (gastroesophageal reflux disease) 04/24/2012   Encounter for well adult exam with abnormal findings 04/13/2010   Allergic rhinitis 10/30/2009   DISC DISEASE, CERVICAL 10/29/2009   Hives 05/03/2009   Essential hypertension 10/11/2007   HLD (hyperlipidemia) 06/14/2007   Asthma 12/25/2006   Morbid obesity (HCC) 09/04/2006   Generalized anxiety disorder 09/04/2006   Anxiety with depression 09/04/2006   LOW BACK PAIN 09/04/2006    Past Surgical History:  Procedure Laterality Date   back surgury  01/2003   s/p lumbar disc   CESAREAN SECTION N/A 02/19/2013   Procedure: CESAREAN SECTION;  Surgeon: Ovid DELENA All, MD;  Location: [facility];  Service: Obstetrics;  Laterality: N/A;   LAPAROSCOPIC APPENDECTOMY N/A 12/09/2018   Procedure: APPENDECTOMY LAPAROSCOPIC;  Surgeon: Tye Millet, DO;  Location: [facility];  Service: General;  Laterality: N/A;   LAPAROSCOPY  04/07/2011   Procedure: LAPAROSCOPY OPERATIVE;  Surgeon: Charlie CHRISTELLA Croak, MD;  Location: [facility];  Service: Gynecology;  Laterality: N/A;  left salpingogectomy    OB History     Gravida  3   Para  1   Term  1   Preterm      AB  2   Living  1      SAB  1   IAB      Ectopic  1   Multiple      Live Births  1            Home Medications    Prior to Admission medications  Medication Sig Start Date End Date Taking? Authorizing Provider  ALPRAZolam  (XANAX ) 1 MG tablet TAKE 1 TABLET BY MOUTH THREE TIMES A DAY AS NEEDED FOR  ANXIETY 10/11/23  Yes Norleen Lynwood ORN, MD  amoxicillin -clavulanate (AUGMENTIN ) 875-125 MG tablet Take 1 tablet by mouth every 12 (twelve) hours. 12/21/23  Yes Corlis Burnard DEL, NP  cetirizine  (ZYRTEC ) 5 MG tablet Take 10 mg by mouth 2 (two) times daily.   Yes [provider]  fluconazole  (DIFLUCAN ) 150 MG tablet Take 1 tablet (150 mg total) by mouth once for 1 dose. 12/21/23 12/21/23 Yes Corlis Burnard DEL, NP  losartan  (COZAAR ) 50 MG tablet TAKE 1 TABLET BY MOUTH EVERY DAY 10/08/23  Yes Agbor-Etang, Redell, MD  RHAPSIDO 25 MG TABS  12/05/23  Yes [provider]  albuterol  (VENTOLIN  HFA) 108 (90 Base) MCG/ACT inhaler Inhale 2 puffs into the lungs every 6 (six) hours as needed. 03/31/22   Norleen Lynwood ORN, MD  cromolyn (GASTROCROM) 100 MG/5ML solution Take 200 mg by mouth 4 (four) times daily.    [provider]  EPINEPHrine  0.3 mg/0.3 mL IJ SOAJ injection Inject 0.3 mg into the muscle as needed. 03/31/22   Norleen Lynwood ORN, MD  fluticasone  (FLONASE ) 50 MCG/ACT nasal spray Place 1-2 sprays into both nostrils daily. 01/12/20   Wieters, Hallie C, PA-C  Fluticasone -Salmeterol (ADVAIR) 100-50 MCG/DOSE AEPB Inhale 1 puff into the lungs 2 (two) times daily. Patient not taking: Reported on 09/20/2023 03/07/19   Norleen Lynwood ORN, MD  hydroxychloroquine  (PLAQUENIL ) 200 MG tablet Take 200 mg by mouth 2 (two) times daily. Patient not taking: Reported on 12/21/2023 07/28/18   [provider]  levonorgestrel (MIRENA, 52 MG,) 20 MCG/DAY IUD Mirena 20 mcg/24 hours (7 yrs) 52 mg intrauterine device  Take 1 device by intrauterine route as directed.    [provider]  meloxicam  (MOBIC ) 15 MG tablet TAKE 1 TABLET BY MOUTH EVERY DAY AS NEEDED FOR PAIN 08/09/23   Norleen Lynwood ORN, MD  omalizumab (XOLAIR) 150 MG/ML prefilled syringe Inject 300 mg into the skin See admin instructions. INJECT 2 (150 MG) SYRINGES UNDER THE SKIN FOR A TOTAL DOSE OF 300MG  EVERY 4 WEEKS. Patient not taking: Reported on 12/21/2023  06/17/18   [provider]  pantoprazole  (PROTONIX ) 40 MG tablet Take 1 tablet (40 mg total) by mouth daily. Patient not taking: Reported on 09/20/2023 04/13/23   Norleen Lynwood ORN, MD  predniSONE  (DELTASONE ) 10 MG tablet 3 tabs by mouth per day for 3 days,2tabs per day for 3 days,1tab per day for 3 days Patient not taking: Reported on 12/21/2023 04/13/23   Norleen Lynwood ORN, MD  promethazine  (PHENERGAN ) 25 MG tablet Take 1 tablet (25 mg total) by mouth every 8 (eight) hours as needed for nausea or vomiting. Patient not taking: Reported on 12/21/2023 04/13/23   Norleen Lynwood ORN, MD  famotidine  (PEPCID ) 20 MG tablet Take 20 mg by mouth 2 (two) times daily. 08/15/18 01/12/20  [provider]    Family History Family History  Problem Relation Age of Onset   Anxiety disorder Father    Diabetes Father     Social History Social History[1]   Allergies   Ambien  [zolpidem ] and Prozac  [fluoxetine  hcl]   Review of Systems Review of Systems  Constitutional:  Positive for fever. Negative for chills.  HENT:  Positive for congestion, postnasal drip, rhinorrhea, sinus pressure and voice change. Negative for ear pain and sore throat.   Respiratory:  Positive for cough. Negative for shortness of breath.   Gastrointestinal:  Negative for diarrhea and vomiting.     Physical Exam Triage Vital Signs ED Triage Vitals  Encounter Vitals Group     BP 12/21/23 1840 139/88     Girls Systolic BP Percentile --      Girls Diastolic BP Percentile --      Boys Systolic BP Percentile --      Boys Diastolic BP Percentile --      Pulse Rate 12/21/23 1840 (!) 108     Resp 12/21/23 1840 18     Temp 12/21/23 1840 98.6 F (37 C)     Temp src --      SpO2 12/21/23 1840 99 %     Weight --      Height --      Head Circumference --      Peak Flow --      Pain Score 12/21/23 1843 9     Pain Loc --      Pain Education --      Exclude from Growth Chart --    No data found.  Updated Vital Signs BP 139/88    Pulse (!) 108   Temp 98.6 F (37 C)   Resp 18   SpO2 99%   Visual Acuity Right Eye Distance:   Left Eye Distance:   Bilateral Distance:    Right Eye Near:   Left Eye Near:    Bilateral Near:     Physical Exam Constitutional:      General: She is not in acute distress. HENT:     Right Ear: Tympanic membrane normal.     Left Ear: Tympanic membrane normal.     Nose: Congestion and rhinorrhea present.     Mouth/Throat:     Mouth: Mucous membranes are moist.     Pharynx: Oropharynx is clear.  Cardiovascular:     Rate and Rhythm: Normal rate and regular rhythm.     Heart sounds: Normal heart sounds.  Pulmonary:     Effort: Pulmonary effort is normal. No respiratory distress.     Breath sounds: Normal breath sounds. No wheezing.  Neurological:     Mental Status: She is alert.      UC Treatments / Results  Labs (all labs ordered are listed, but only abnormal results are displayed) Labs Reviewed  POCT RAPID STREP A (OFFICE)  POC COVID19/FLU A&B COMBO    EKG   Radiology No results found.  Procedures Procedures (including critical care time)  Medications Ordered in UC Medications - No data to display  Initial Impression / Assessment and Plan / UC Course  I have reviewed the triage vital signs and the nursing notes.  Pertinent labs & imaging results that were available during my care of the patient were reviewed by me and considered in my medical decision making (see chart for details).    Acute sinusitis, history of vaginal yeast infection with antibiotic use.  Rapid strep negative.  Rapid COVID and flu negative.  Treating with Augmentin .  1 dose of Diflucan  also prescribed as patient reports history of vaginal yeast infection when taking an antibiotic.  Education provided on sinus infection.  Instructed patient to follow-up with her PCP.  She agrees to plan of care.  Final Clinical Impressions(s) / UC Diagnoses   Final diagnoses:  Acute non-recurrent  maxillary sinusitis  History of candidiasis of vagina     Discharge Instructions      Take the Augmentin  as directed.    Follow up with your primary care provider.        ED Prescriptions     Medication Sig Dispense Auth. Provider   amoxicillin -clavulanate (AUGMENTIN ) 875-125 MG tablet Take 1 tablet by mouth every 12 (twelve) hours. 14 tablet Corlis Sor H, NP   fluconazole  (DIFLUCAN ) 150 MG tablet Take 1 tablet (150 mg total) by mouth once for 1 dose. 1 tablet Corlis Sor DEL, NP      PDMP not reviewed this encounter.    [1]  Social History Tobacco Use   Smoking status: Former    Current packs/day: 0.50    Average packs/day: 0.5 packs/day for 16.0 years (8.0 ttl pk-yrs)    Types: Cigarettes   Smokeless tobacco: Never  Vaping Use   Vaping status: Every Day   Substances: Nicotine, Flavoring  Substance Use Topics   Alcohol use: Not Currently   Drug use: No     Corlis Sor DEL, NP 12/21/23 1912

## 2023-12-21 NOTE — Discharge Instructions (Addendum)
 Take the Augmentin as directed.  Follow up with your primary care provider.

## 2023-12-21 NOTE — ED Triage Notes (Signed)
 Patient to Urgent Care with complaints of  fevers (102)/ thick and yellow mucus/ productive cough/ sore throat.  Reports she possibly had the flu over thanksgiving and worsened today. Negative home covid and flu test.   Taking tylenol .

## 2024-01-18 ENCOUNTER — Other Ambulatory Visit: Payer: Self-pay | Admitting: Internal Medicine

## 2024-02-01 ENCOUNTER — Other Ambulatory Visit: Payer: Self-pay | Admitting: Internal Medicine
